# Patient Record
Sex: Female | Born: 1970 | Race: White | Hispanic: No | Marital: Married | Smoking: Never smoker
Health system: Southern US, Community
[De-identification: ages and names within clinical notes are randomized; demographics above are authoritative.]

## PROBLEM LIST (undated history)

## (undated) DIAGNOSIS — E785 Hyperlipidemia, unspecified: Secondary | ICD-10-CM

## (undated) DIAGNOSIS — IMO0002 Reserved for concepts with insufficient information to code with codable children: Secondary | ICD-10-CM

## (undated) DIAGNOSIS — I340 Nonrheumatic mitral (valve) insufficiency: Secondary | ICD-10-CM

## (undated) DIAGNOSIS — M329 Systemic lupus erythematosus, unspecified: Secondary | ICD-10-CM

## (undated) DIAGNOSIS — R002 Palpitations: Secondary | ICD-10-CM

## (undated) DIAGNOSIS — L719 Rosacea, unspecified: Secondary | ICD-10-CM

## (undated) DIAGNOSIS — J302 Other seasonal allergic rhinitis: Secondary | ICD-10-CM

## (undated) DIAGNOSIS — M1990 Unspecified osteoarthritis, unspecified site: Secondary | ICD-10-CM

## (undated) DIAGNOSIS — B029 Zoster without complications: Secondary | ICD-10-CM

## (undated) DIAGNOSIS — D649 Anemia, unspecified: Secondary | ICD-10-CM

## (undated) DIAGNOSIS — I10 Essential (primary) hypertension: Secondary | ICD-10-CM

## (undated) DIAGNOSIS — R0600 Dyspnea, unspecified: Secondary | ICD-10-CM

## (undated) DIAGNOSIS — F419 Anxiety disorder, unspecified: Secondary | ICD-10-CM

## (undated) DIAGNOSIS — K219 Gastro-esophageal reflux disease without esophagitis: Secondary | ICD-10-CM

## (undated) DIAGNOSIS — R011 Cardiac murmur, unspecified: Secondary | ICD-10-CM

## (undated) DIAGNOSIS — T7840XA Allergy, unspecified, initial encounter: Secondary | ICD-10-CM

## (undated) HISTORY — PX: ROTATOR CUFF REPAIR: SHX139

## (undated) HISTORY — DX: Allergy, unspecified, initial encounter: T78.40XA

## (undated) HISTORY — DX: Gastro-esophageal reflux disease without esophagitis: K21.9

## (undated) HISTORY — DX: Anxiety disorder, unspecified: F41.9

## (undated) HISTORY — PX: APPENDECTOMY: SHX54

## (undated) HISTORY — DX: Reserved for concepts with insufficient information to code with codable children: IMO0002

## (undated) HISTORY — DX: Unspecified osteoarthritis, unspecified site: M19.90

## (undated) HISTORY — DX: Nonrheumatic mitral (valve) insufficiency: I34.0

## (undated) HISTORY — DX: Zoster without complications: B02.9

## (undated) HISTORY — DX: Essential (primary) hypertension: I10

## (undated) HISTORY — DX: Rosacea, unspecified: L71.9

## (undated) HISTORY — DX: Dyspnea, unspecified: R06.00

## (undated) HISTORY — DX: Anemia, unspecified: D64.9

## (undated) HISTORY — DX: Cardiac murmur, unspecified: R01.1

## (undated) HISTORY — DX: Palpitations: R00.2

## (undated) HISTORY — DX: Systemic lupus erythematosus, unspecified: M32.9

## (undated) HISTORY — DX: Other seasonal allergic rhinitis: J30.2

## (undated) HISTORY — DX: Hyperlipidemia, unspecified: E78.5

---

## 1998-05-17 ENCOUNTER — Other Ambulatory Visit: Admission: RE | Admit: 1998-05-17 | Discharge: 1998-05-17 | Payer: Self-pay | Admitting: Obstetrics & Gynecology

## 1998-11-14 ENCOUNTER — Encounter: Payer: Self-pay | Admitting: Emergency Medicine

## 1998-11-14 ENCOUNTER — Inpatient Hospital Stay (HOSPITAL_COMMUNITY): Admission: EM | Admit: 1998-11-14 | Discharge: 1998-11-16 | Payer: Self-pay | Admitting: Emergency Medicine

## 1998-11-15 ENCOUNTER — Encounter: Payer: Self-pay | Admitting: Family Medicine

## 1999-01-11 ENCOUNTER — Inpatient Hospital Stay (HOSPITAL_COMMUNITY): Admission: AD | Admit: 1999-01-11 | Discharge: 1999-01-13 | Payer: Self-pay | Admitting: Obstetrics & Gynecology

## 2000-04-03 ENCOUNTER — Encounter: Payer: Self-pay | Admitting: Family Medicine

## 2000-04-03 ENCOUNTER — Encounter: Admission: RE | Admit: 2000-04-03 | Discharge: 2000-04-03 | Payer: Self-pay | Admitting: Family Medicine

## 2000-04-10 ENCOUNTER — Encounter: Payer: Self-pay | Admitting: Family Medicine

## 2000-04-10 ENCOUNTER — Encounter: Admission: RE | Admit: 2000-04-10 | Discharge: 2000-04-10 | Payer: Self-pay | Admitting: Family Medicine

## 2000-04-17 ENCOUNTER — Encounter: Admission: RE | Admit: 2000-04-17 | Discharge: 2000-04-17 | Payer: Self-pay | Admitting: Family Medicine

## 2000-04-17 ENCOUNTER — Encounter: Payer: Self-pay | Admitting: Family Medicine

## 2000-05-08 ENCOUNTER — Other Ambulatory Visit: Admission: RE | Admit: 2000-05-08 | Discharge: 2000-05-08 | Payer: Self-pay | Admitting: Obstetrics & Gynecology

## 2000-05-13 ENCOUNTER — Encounter: Payer: Self-pay | Admitting: Obstetrics & Gynecology

## 2000-05-13 ENCOUNTER — Encounter: Admission: RE | Admit: 2000-05-13 | Discharge: 2000-05-13 | Payer: Self-pay | Admitting: Obstetrics & Gynecology

## 2000-06-29 ENCOUNTER — Ambulatory Visit (HOSPITAL_COMMUNITY): Admission: RE | Admit: 2000-06-29 | Discharge: 2000-06-29 | Payer: Self-pay | Admitting: Neurosurgery

## 2000-06-29 ENCOUNTER — Encounter: Payer: Self-pay | Admitting: Neurosurgery

## 2002-07-13 ENCOUNTER — Emergency Department (HOSPITAL_COMMUNITY): Admission: EM | Admit: 2002-07-13 | Discharge: 2002-07-13 | Payer: Self-pay | Admitting: Emergency Medicine

## 2005-02-19 ENCOUNTER — Encounter: Admission: RE | Admit: 2005-02-19 | Discharge: 2005-02-19 | Payer: Self-pay | Admitting: Family Medicine

## 2005-12-31 ENCOUNTER — Other Ambulatory Visit: Admission: RE | Admit: 2005-12-31 | Discharge: 2005-12-31 | Payer: Self-pay | Admitting: Obstetrics and Gynecology

## 2007-01-27 ENCOUNTER — Other Ambulatory Visit: Admission: RE | Admit: 2007-01-27 | Discharge: 2007-01-27 | Payer: Self-pay | Admitting: Family Medicine

## 2007-10-09 ENCOUNTER — Encounter: Admission: RE | Admit: 2007-10-09 | Discharge: 2007-10-09 | Payer: Self-pay | Admitting: Family Medicine

## 2008-01-18 ENCOUNTER — Ambulatory Visit (HOSPITAL_COMMUNITY): Admission: RE | Admit: 2008-01-18 | Discharge: 2008-01-18 | Payer: Self-pay | Admitting: Family Medicine

## 2008-03-01 ENCOUNTER — Other Ambulatory Visit: Admission: RE | Admit: 2008-03-01 | Discharge: 2008-03-01 | Payer: Self-pay | Admitting: Family Medicine

## 2008-03-11 ENCOUNTER — Ambulatory Visit: Payer: Self-pay

## 2008-03-11 ENCOUNTER — Encounter: Payer: Self-pay | Admitting: Cardiovascular Disease

## 2008-03-11 ENCOUNTER — Ambulatory Visit: Payer: Self-pay | Admitting: Cardiovascular Disease

## 2008-05-26 ENCOUNTER — Ambulatory Visit: Payer: Self-pay | Admitting: Cardiovascular Disease

## 2008-11-01 ENCOUNTER — Ambulatory Visit: Payer: Self-pay | Admitting: Cardiovascular Disease

## 2009-02-18 DIAGNOSIS — M329 Systemic lupus erythematosus, unspecified: Secondary | ICD-10-CM | POA: Insufficient documentation

## 2009-02-18 DIAGNOSIS — R0602 Shortness of breath: Secondary | ICD-10-CM | POA: Insufficient documentation

## 2009-02-18 DIAGNOSIS — K219 Gastro-esophageal reflux disease without esophagitis: Secondary | ICD-10-CM | POA: Insufficient documentation

## 2009-02-18 DIAGNOSIS — R002 Palpitations: Secondary | ICD-10-CM | POA: Insufficient documentation

## 2009-02-18 DIAGNOSIS — I080 Rheumatic disorders of both mitral and aortic valves: Secondary | ICD-10-CM | POA: Insufficient documentation

## 2009-04-07 ENCOUNTER — Emergency Department (HOSPITAL_COMMUNITY): Admission: EM | Admit: 2009-04-07 | Discharge: 2009-04-08 | Payer: Self-pay | Admitting: Emergency Medicine

## 2009-04-11 ENCOUNTER — Ambulatory Visit: Payer: Self-pay | Admitting: Diagnostic Radiology

## 2009-04-11 ENCOUNTER — Emergency Department (HOSPITAL_BASED_OUTPATIENT_CLINIC_OR_DEPARTMENT_OTHER): Admission: EM | Admit: 2009-04-11 | Discharge: 2009-04-11 | Payer: Self-pay | Admitting: Emergency Medicine

## 2009-04-12 ENCOUNTER — Ambulatory Visit: Payer: Self-pay | Admitting: Family Medicine

## 2009-04-12 DIAGNOSIS — J45909 Unspecified asthma, uncomplicated: Secondary | ICD-10-CM | POA: Insufficient documentation

## 2009-04-13 ENCOUNTER — Telehealth (INDEPENDENT_AMBULATORY_CARE_PROVIDER_SITE_OTHER): Payer: Self-pay | Admitting: *Deleted

## 2009-05-01 ENCOUNTER — Ambulatory Visit: Payer: Self-pay | Admitting: Cardiovascular Disease

## 2009-05-01 ENCOUNTER — Telehealth (INDEPENDENT_AMBULATORY_CARE_PROVIDER_SITE_OTHER): Payer: Self-pay | Admitting: *Deleted

## 2009-05-16 ENCOUNTER — Encounter: Admission: RE | Admit: 2009-05-16 | Discharge: 2009-08-14 | Payer: Self-pay | Admitting: Orthopedic Surgery

## 2009-07-25 ENCOUNTER — Encounter: Payer: Self-pay | Admitting: Family Medicine

## 2009-07-25 ENCOUNTER — Ambulatory Visit: Payer: Self-pay | Admitting: Family Medicine

## 2009-07-25 ENCOUNTER — Other Ambulatory Visit: Admission: RE | Admit: 2009-07-25 | Discharge: 2009-07-25 | Payer: Self-pay | Admitting: Family Medicine

## 2009-07-25 DIAGNOSIS — L719 Rosacea, unspecified: Secondary | ICD-10-CM | POA: Insufficient documentation

## 2009-07-27 ENCOUNTER — Encounter (INDEPENDENT_AMBULATORY_CARE_PROVIDER_SITE_OTHER): Payer: Self-pay | Admitting: *Deleted

## 2009-07-28 ENCOUNTER — Ambulatory Visit: Payer: Self-pay | Admitting: Family Medicine

## 2009-08-01 ENCOUNTER — Encounter: Admission: RE | Admit: 2009-08-01 | Discharge: 2009-08-01 | Payer: Self-pay | Admitting: Family Medicine

## 2009-08-01 ENCOUNTER — Encounter (INDEPENDENT_AMBULATORY_CARE_PROVIDER_SITE_OTHER): Payer: Self-pay | Admitting: *Deleted

## 2009-08-01 LAB — CONVERTED CEMR LAB
Albumin: 4.1 g/dL (ref 3.5–5.2)
Eosinophils Absolute: 0.1 10*3/uL (ref 0.0–0.7)
Eosinophils Relative: 2.9 % (ref 0.0–5.0)
HCT: 38.8 % (ref 36.0–46.0)
Hemoglobin: 13 g/dL (ref 12.0–15.0)
MCHC: 33.6 g/dL (ref 30.0–36.0)
MCV: 89.6 fL (ref 78.0–100.0)
Monocytes Relative: 7.1 % (ref 3.0–12.0)
Neutrophils Relative %: 58.9 % (ref 43.0–77.0)
RDW: 12.2 % (ref 11.5–14.6)
TSH: 0.85 microintl units/mL (ref 0.35–5.50)
Total Bilirubin: 0.9 mg/dL (ref 0.3–1.2)
Total Protein: 6.9 g/dL (ref 6.0–8.3)
Triglycerides: 71 mg/dL (ref 0.0–149.0)

## 2009-08-02 ENCOUNTER — Telehealth (INDEPENDENT_AMBULATORY_CARE_PROVIDER_SITE_OTHER): Payer: Self-pay | Admitting: *Deleted

## 2009-08-04 ENCOUNTER — Ambulatory Visit: Payer: Self-pay | Admitting: Family Medicine

## 2009-08-04 DIAGNOSIS — R030 Elevated blood-pressure reading, without diagnosis of hypertension: Secondary | ICD-10-CM | POA: Insufficient documentation

## 2009-08-06 ENCOUNTER — Encounter: Admission: RE | Admit: 2009-08-06 | Discharge: 2009-08-06 | Payer: Self-pay | Admitting: Orthopedic Surgery

## 2009-08-24 ENCOUNTER — Encounter: Admission: RE | Admit: 2009-08-24 | Discharge: 2009-08-31 | Payer: Self-pay | Admitting: Orthopedic Surgery

## 2009-09-13 ENCOUNTER — Encounter (INDEPENDENT_AMBULATORY_CARE_PROVIDER_SITE_OTHER): Payer: Self-pay | Admitting: *Deleted

## 2009-10-31 ENCOUNTER — Ambulatory Visit: Payer: Self-pay | Admitting: Family

## 2009-10-31 DIAGNOSIS — J0100 Acute maxillary sinusitis, unspecified: Secondary | ICD-10-CM | POA: Insufficient documentation

## 2010-01-13 ENCOUNTER — Encounter: Admission: RE | Admit: 2010-01-13 | Discharge: 2010-01-13 | Payer: Self-pay | Admitting: Orthopedic Surgery

## 2010-01-16 ENCOUNTER — Encounter: Admission: RE | Admit: 2010-01-16 | Discharge: 2010-01-16 | Payer: Self-pay | Admitting: Neurological Surgery

## 2010-01-22 ENCOUNTER — Telehealth (INDEPENDENT_AMBULATORY_CARE_PROVIDER_SITE_OTHER): Payer: Self-pay | Admitting: *Deleted

## 2010-01-23 HISTORY — PX: CERVICAL SPINE SURGERY: SHX589

## 2010-02-06 ENCOUNTER — Encounter: Payer: Self-pay | Admitting: Family

## 2010-03-20 ENCOUNTER — Encounter: Admission: RE | Admit: 2010-03-20 | Discharge: 2010-03-20 | Payer: Self-pay | Admitting: Neurological Surgery

## 2010-05-25 HISTORY — PX: CHOLECYSTECTOMY: SHX55

## 2010-06-12 ENCOUNTER — Encounter: Admission: RE | Admit: 2010-06-12 | Discharge: 2010-06-12 | Payer: Self-pay | Admitting: Neurological Surgery

## 2010-06-13 ENCOUNTER — Ambulatory Visit: Payer: Self-pay | Admitting: Diagnostic Radiology

## 2010-06-13 ENCOUNTER — Ambulatory Visit (HOSPITAL_COMMUNITY): Admission: EM | Admit: 2010-06-13 | Discharge: 2010-06-15 | Payer: Self-pay

## 2010-06-13 ENCOUNTER — Encounter: Payer: Self-pay | Admitting: Emergency Medicine

## 2010-06-14 ENCOUNTER — Encounter (INDEPENDENT_AMBULATORY_CARE_PROVIDER_SITE_OTHER): Payer: Self-pay

## 2010-08-14 ENCOUNTER — Ambulatory Visit: Payer: Self-pay | Admitting: Family Medicine

## 2010-08-14 DIAGNOSIS — F411 Generalized anxiety disorder: Secondary | ICD-10-CM | POA: Insufficient documentation

## 2010-08-20 ENCOUNTER — Encounter: Admission: RE | Admit: 2010-08-20 | Discharge: 2010-10-16 | Payer: Self-pay | Admitting: Orthopedic Surgery

## 2010-09-01 ENCOUNTER — Encounter: Admission: RE | Admit: 2010-09-01 | Discharge: 2010-09-01 | Payer: Self-pay | Admitting: Neurological Surgery

## 2010-09-11 ENCOUNTER — Other Ambulatory Visit: Admission: RE | Admit: 2010-09-11 | Discharge: 2010-09-11 | Payer: Self-pay | Admitting: Family Medicine

## 2010-09-11 ENCOUNTER — Ambulatory Visit: Payer: Self-pay | Admitting: Family Medicine

## 2010-09-11 LAB — CONVERTED CEMR LAB
ALT: 18 units/L (ref 0–35)
Albumin: 4 g/dL (ref 3.5–5.2)
Basophils Relative: 0.5 % (ref 0.0–3.0)
Creatinine, Ser: 0.6 mg/dL (ref 0.4–1.2)
Eosinophils Relative: 4.2 % (ref 0.0–5.0)
GFR calc non Af Amer: 112.43 mL/min (ref >60)
Glucose, Bld: 84 mg/dL (ref 70–99)
HCT: 37.8 % (ref 36.0–46.0)
Hemoglobin: 12.9 g/dL (ref 12.0–15.0)
Lymphs Abs: 1.5 10*3/uL (ref 0.7–4.0)
Neutrophils Relative %: 61.7 % (ref 43.0–77.0)
Platelets: 189 10*3/uL (ref 150.0–400.0)
RBC: 4.2 M/uL (ref 3.87–5.11)
RDW: 14.3 % (ref 11.5–14.6)
Sodium: 140 meq/L (ref 135–145)
TSH: 0.87 microintl units/mL (ref 0.35–5.50)
Total Protein: 6.7 g/dL (ref 6.0–8.3)
WBC: 5.4 10*3/uL (ref 4.5–10.5)

## 2010-09-28 ENCOUNTER — Ambulatory Visit: Payer: Self-pay | Admitting: Internal Medicine

## 2010-09-28 ENCOUNTER — Encounter: Admission: RE | Admit: 2010-09-28 | Discharge: 2010-09-28 | Payer: Self-pay | Admitting: Family Medicine

## 2010-09-28 DIAGNOSIS — J069 Acute upper respiratory infection, unspecified: Secondary | ICD-10-CM | POA: Insufficient documentation

## 2010-11-25 DIAGNOSIS — A159 Respiratory tuberculosis unspecified: Secondary | ICD-10-CM

## 2010-11-25 HISTORY — DX: Respiratory tuberculosis unspecified: A15.9

## 2010-12-11 ENCOUNTER — Encounter
Admission: RE | Admit: 2010-12-11 | Discharge: 2010-12-11 | Payer: Self-pay | Source: Home / Self Care | Attending: Neurological Surgery | Admitting: Neurological Surgery

## 2010-12-25 NOTE — Letter (Signed)
Summary: Cancer Screening/[facility] Personalized Risk Profile  Cancer Screening/[facility] Personalized Risk Profile   Imported By: Lanelle Bal 09/18/2010 13:10:56  _____________________________________________________________________  External Attachment:    Type:   Image     Comment:   External Document
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
## 2010-12-25 NOTE — Progress Notes (Signed)
   Phone Note From Other Clinic   Caller: [facility] Details for Reason: Pt.Information Initial call taken by: KM    FAxed All Cardiac over to Jodie @ [phone] [facility]  January 22, 2010 8:22 AM

## 2010-12-25 NOTE — Assessment & Plan Note (Signed)
Summary: has had chest cold, gone into asthma??///sph   Vital Signs:  Patient profile:   40 year old female Menstrual status:  irregular Weight:      193 pounds BMI:     32.73 O2 Sat:      98 % Temp:     99.0 degrees F oral Pulse rate:   112 / minute Resp:     16 per minute BP sitting:   120 / 82  (left arm) Cuff size:   large  Vitals Entered By: Shonna Chock CMA (September 28, 2010 8:12 AM) CC: Chest cold and questions now if its asthma, URI symptoms   CC:  Chest cold and questions now if its asthma and URI symptoms.  History of Present Illness:  RTI  Symptoms      This is a 40 year old woman who presents with  RTI  symptoms; onset 3 weeks ago as ST & head congestion.  The patient reports scant purulent nasal discharge, sore throat from cough , and productive cough with significant yellow sputum, but denies nasal congestion and earache.  Associated symptoms include low-grade fever (<100.5 degrees), dyspnea, and wheezing.  PMH of asthma ; albuterol has been used up to tid  , but it  is esentially gone. The patient denies headache.  The patient denies the following risk factors for Strep sinusitis: unilateral facial pain, tooth pain, and tender adenopathy.  Rx: Claritin D, Mucinex, Delsym.Diagnosis of Lupus is questionable ; she is not on immunosuppresants.  Allergies: 1)  ! Codeine 2)  ! * Flovent  Review of Systems General:  Complains of sweats; denies chills. Resp:  Denies chest pain with inspiration. Allergy:  Denies itching eyes and sneezing.  Physical Exam  General:  well-nourished,in no acute distress; alert,appropriate and cooperative throughout examination Eyes:  No corneal or conjunctival inflammation noted. EOMI. Perrla.  Vision grossly normal. Ears:  External ear exam shows no significant lesions or deformities.  Otoscopic examination reveals clear canals, tympanic membranes are intact bilaterally without bulging, retraction, inflammation or discharge. Hearing is  grossly normal bilaterally. L TM minimally dull Nose:  External nasal examination shows no deformity or inflammation. Nasal mucosa are pink and moist without lesions or exudates. Mouth:  Oral mucosa and oropharynx without lesions or exudates.  Teeth in good repair. Lungs:  Normal respiratory effort, chest expands symmetrically. Lungs are clear to auscultation, no crackles or wheezes but dry paroxysmal cough. O2 sats 98%. Heart:  Normal rate and regular rhythm. S1 and S2 normal without gallop, murmur, click, rub.S4 Extremities:  No clubbing, cyanosis, edema, or deformity noted . Skin:  Intact without suspicious lesions or rashes. Facial flushing Cervical Nodes:  No lymphadenopathy noted Axillary Nodes:  No palpable lymphadenopathy Psych:  memory intact for recent and remote, normally interactive, and good eye contact.     Impression & Recommendations:  Problem # 1:  BRONCHITIS-ACUTE (ICD-466.0)  RAD component  by history; PMH of asthma  Her updated medication list for this problem includes:    Azithromycin 250 Mg Tabs (Azithromycin) .Marland Kitchen... As per pack    Proventil Hfa 108 (90 Base) Mcg/act Aers (Albuterol sulfate) .Marland Kitchen... 1-2 puffs every 4 -6 hrs as needed for cough , wheezing    Dulera 200-5 Mcg/act Aero (Mometasone furo-formoterol fum) .Marland Kitchen... 1-2 puffs every 12 hrs ; gargle &spit after use  Problem # 2:  URI (ICD-465.9)  Complete Medication List: 1)  Citalopram Hydrobromide 10 Mg Tabs (Citalopram hydrobromide) .... Take 1 tab by mouth daily  2)  Celebrex  .... Take daily dose unknown 3)  Flexeril 10 Mg Tabs (Cyclobenzaprine hcl) .... Take one tablet as needed 4)  Azithromycin 250 Mg Tabs (Azithromycin) .... As per pack 5)  Proventil Hfa 108 (90 Base) Mcg/act Aers (Albuterol sulfate) .Marland Kitchen.. 1-2 puffs every 4 -6 hrs as needed for cough , wheezing 6)  Dulera 200-5 Mcg/act Aero (Mometasone furo-formoterol fum) .Marland Kitchen.. 1-2 puffs every 12 hrs ; gargle &spit after use  Patient Instructions: 1)   Neti pot once daily as needed for head congestion .  2)  Drink as much  NON dairy fluid as you can tolerate for the next few days. Prescriptions: DULERA 200-5 MCG/ACT AERO (MOMETASONE FURO-FORMOTEROL FUM) 1-2 puffs every 12 hrs ; gargle &spit after use  #1 x 11   Entered and Authorized by:   Marga Melnick MD   Signed by:   Marga Melnick MD on 09/28/2010   Method used:   Samples Given   RxID:   [id number] PROVENTIL HFA 108 (90 BASE) MCG/ACT AERS (ALBUTEROL SULFATE) 1-2 puffs every 4 -6 hrs as needed for cough , wheezing  #1 x 2   Entered and Authorized by:   Marga Melnick MD   Signed by:   Marga Melnick MD on 09/28/2010   Method used:   Samples Given   RxID:   [id number] AZITHROMYCIN 250 MG TABS (AZITHROMYCIN) as per pack  #1 x 0   Entered and Authorized by:   Marga Melnick MD   Signed by:   Marga Melnick MD on 09/28/2010   Method used:   Faxed to ...       [id number]* (retail)       [address]       [place], [place]  [address]       Ph: [phone]       Fax: [phone]   RxID:   [id number]    Orders Added: 1)  Est. Patient Level IV [295[address]]  Appended Document: has had chest cold, gone into asthma??///sph Note : P recheck was 72. Tylenol every 4 hrs as needed for fever
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
## 2010-12-25 NOTE — Letter (Signed)
Summary: [facility]  [facility]   Imported By: Lanelle Bal 02/15/2010 11:53:12  _____________________________________________________________________  External Attachment:    Type:   Image     Comment:   External Document

## 2011-02-01 ENCOUNTER — Other Ambulatory Visit: Payer: Self-pay | Admitting: Neurological Surgery

## 2011-02-01 DIAGNOSIS — M542 Cervicalgia: Secondary | ICD-10-CM

## 2011-02-09 LAB — DIFFERENTIAL
Lymphocytes Relative: 22 % (ref 12–46)
Lymphs Abs: 1.3 10*3/uL (ref 0.7–4.0)
Monocytes Absolute: 0.3 10*3/uL (ref 0.1–1.0)
Neutrophils Relative %: 67 % (ref 43–77)

## 2011-02-09 LAB — URINE CULTURE
Colony Count: 60000
Culture  Setup Time: 201107202043

## 2011-02-09 LAB — CBC
Hemoglobin: 12.9 g/dL (ref 12.0–15.0)
MCHC: 33 g/dL (ref 30.0–36.0)
MCV: 88.4 fL (ref 78.0–100.0)
RBC: 4.43 MIL/uL (ref 3.87–5.11)

## 2011-02-09 LAB — URINE MICROSCOPIC-ADD ON

## 2011-02-09 LAB — URINALYSIS, ROUTINE W REFLEX MICROSCOPIC
Bilirubin Urine: NEGATIVE
Glucose, UA: NEGATIVE mg/dL
Hgb urine dipstick: NEGATIVE
Ketones, ur: NEGATIVE mg/dL
Specific Gravity, Urine: 1.013 (ref 1.005–1.030)

## 2011-02-09 LAB — COMPREHENSIVE METABOLIC PANEL
AST: 17 U/L (ref 0–37)
Albumin: 4.1 g/dL (ref 3.5–5.2)
Alkaline Phosphatase: 72 U/L (ref 39–117)
BUN: 10 mg/dL (ref 6–23)
Creatinine, Ser: 0.7 mg/dL (ref 0.4–1.2)
GFR calc Af Amer: >60 mL/min (ref >60)
Glucose, Bld: 96 mg/dL (ref 70–99)
Potassium: 4 mEq/L (ref 3.5–5.1)
Sodium: 142 mEq/L (ref 135–145)
Total Protein: 7.2 g/dL (ref 6.0–8.3)

## 2011-02-09 LAB — PREGNANCY, URINE: Preg Test, Ur: NEGATIVE

## 2011-02-09 LAB — POCT CARDIAC MARKERS: Myoglobin, poc: 36.2 ng/mL (ref 12–200)

## 2011-02-13 ENCOUNTER — Ambulatory Visit (INDEPENDENT_AMBULATORY_CARE_PROVIDER_SITE_OTHER): Payer: BC Managed Care – PPO | Admitting: *Deleted

## 2011-02-13 DIAGNOSIS — Z111 Encounter for screening for respiratory tuberculosis: Secondary | ICD-10-CM

## 2011-02-15 ENCOUNTER — Ambulatory Visit (HOSPITAL_BASED_OUTPATIENT_CLINIC_OR_DEPARTMENT_OTHER)
Admission: RE | Admit: 2011-02-15 | Discharge: 2011-02-15 | Disposition: A | Discharge status: Home / Self Care | Payer: BC Managed Care – PPO | Source: Ambulatory Visit | Attending: Family Medicine | Admitting: Family Medicine

## 2011-02-15 DIAGNOSIS — R7611 Nonspecific reaction to tuberculin skin test without active tuberculosis: Secondary | ICD-10-CM | POA: Insufficient documentation

## 2011-02-15 DIAGNOSIS — Z111 Encounter for screening for respiratory tuberculosis: Secondary | ICD-10-CM

## 2011-02-15 LAB — TB SKIN TEST: TB Skin Test: POSITIVE mm

## 2011-02-15 MED ORDER — ISONIAZID 300 MG PO TABS: 300.0000 mg | ORAL_TABLET | Freq: Every day | ORAL | Status: AC

## 2011-02-15 MED ORDER — PYRIDOXINE HCL 50 MG PO TABS: 50.0000 mg | ORAL_TABLET | Freq: Every day | ORAL | Status: AC

## 2011-02-15 NOTE — Progress Notes (Signed)
  Subjective:    Patient ID: Lynn Campos, female    DOB: [birth year], 40 y.o.   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  Asymptomatic.  Denies cough, chills, fevers, nightsweats.  Review of Systems For ROS see HPI     Objective:   Physical Exam R forearm- 15 mm of erythema and induration at site of PPD       Assessment & Plan:  + PPD- get CXR and start 9 months of isoniazide + Vit B6.  Pt aware.

## 2011-02-20 ENCOUNTER — Telehealth: Payer: Self-pay | Admitting: *Deleted

## 2011-02-20 ENCOUNTER — Telehealth: Payer: Self-pay | Admitting: Family Medicine

## 2011-02-20 NOTE — Telephone Encounter (Signed)
Please advise 

## 2011-02-20 NOTE — Telephone Encounter (Signed)
Discuss with patient, will continue current med and if symptoms do not resolve will call back for med change.Felecia Brant Peets CMA

## 2011-02-20 NOTE — Telephone Encounter (Signed)
There are 2 options- either continue the med and hope the side effects improve w/ time OR switch to Rifampin 600mg  daily x4 months.

## 2011-02-20 NOTE — Telephone Encounter (Signed)
Pt states that since starting isoniazid she has been experiencing diarrhea and blurred vision. Pt notes that these are some of the side effects listed on med. Pt [facility]. Pls advise.Lynn Campos CMA

## 2011-02-20 NOTE — Telephone Encounter (Signed)
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   thanks

## 2011-02-20 NOTE — Telephone Encounter (Signed)
Needs hepatic panel (doesn't need to be fasting).  Dx- high risk medication use

## 2011-02-21 NOTE — Telephone Encounter (Signed)
Updated lab info for 6/13 lab visit--called patient to inform her that it does not have to be fasting

## 2011-03-01 LAB — HM MAMMOGRAPHY

## 2011-03-05 LAB — LACTIC ACID, PLASMA: Lactic Acid, Venous: 1.8 mmol/L (ref 0.5–2.2)

## 2011-03-05 LAB — CBC
MCHC: 34.7 g/dL (ref 30.0–36.0)
RBC: 4.34 MIL/uL (ref 3.87–5.11)
WBC: 7.7 10*3/uL (ref 4.0–10.5)

## 2011-03-11 ENCOUNTER — Ambulatory Visit (HOSPITAL_BASED_OUTPATIENT_CLINIC_OR_DEPARTMENT_OTHER)
Admission: RE | Admit: 2011-03-11 | Discharge: 2011-03-11 | Disposition: A | Discharge status: Home / Self Care | Payer: BC Managed Care – PPO | Source: Ambulatory Visit | Attending: Family Medicine | Admitting: Family Medicine

## 2011-03-11 ENCOUNTER — Encounter: Payer: Self-pay | Admitting: Family Medicine

## 2011-03-11 ENCOUNTER — Ambulatory Visit (INDEPENDENT_AMBULATORY_CARE_PROVIDER_SITE_OTHER): Payer: BC Managed Care – PPO | Admitting: Family Medicine

## 2011-03-11 VITALS — BP 140/90 | HR 131 | Temp 99.1°F | Wt 198.5 lb

## 2011-03-11 DIAGNOSIS — R05 Cough: Secondary | ICD-10-CM

## 2011-03-11 DIAGNOSIS — R059 Cough, unspecified: Secondary | ICD-10-CM

## 2011-03-11 MED ORDER — OSELTAMIVIR PHOSPHATE 75 MG PO CAPS: 75.0000 mg | ORAL_CAPSULE | Freq: Two times a day (BID) | ORAL | Status: AC

## 2011-03-11 NOTE — Assessment & Plan Note (Signed)
Given temp and cough must r/o PNA prior to starting Tamiflu.  sxs of rapid onset, body aches, cough and fever consistent w/ flu like illness.  If CXR (-) will start antiviral meds.  Reviewed supportive care and red flags that should prompt return.  Pt expressed understanding and is in agreement w/ plan.

## 2011-03-11 NOTE — Progress Notes (Signed)
  Subjective:    Patient ID: Lynn Campos, female    DOB: [birth year], 40 y.o.   MRN: [id number]  HPI Cough- started Saturday, 'like a truck'.  + fevers, Tm 101.4.  Alternating tylenol/ibuprofen.  + body aches which is most bothersome to pt.  Cough- productive of green sputum.  Denies facial pain/pressure, nasal congestion.  + sore throat.  + PND.  Denies ear pain.  Denies sick contacts.  'even my hair hurts'.   Review of Systems For ROS see HPI     Objective:   Physical Exam  Constitutional: She is oriented to person, place, and time.       Not feeling well  HENT:  Head: Normocephalic and atraumatic.       No TTP over sinuses  Eyes: Conjunctivae and EOM are normal. Pupils are equal, round, and reactive to light.  Neck: Normal range of motion. Neck supple. No thyromegaly present.  Cardiovascular: Normal rate, regular rhythm, normal heart sounds and intact distal pulses.   No murmur heard. Pulmonary/Chest: Effort normal and breath sounds normal. No respiratory distress. She has no wheezes.       Wet cough heard  Lymphadenopathy:    She has no cervical adenopathy.  Neurological: She is alert and oriented to person, place, and time.  Skin: Skin is warm and dry.          Assessment & Plan:

## 2011-03-11 NOTE — Patient Instructions (Signed)
[facility] to rule out Pneumonia Your flu test was negative but i'm still suspicious- so if there isn't a pneumonia we'll start the Tamiflu Continue the Ibuprofen for pain Use a heating pad for muscle aches Drink plenty of fluids Hang in there!

## 2011-03-13 ENCOUNTER — Telehealth: Payer: Self-pay | Admitting: *Deleted

## 2011-03-13 NOTE — Telephone Encounter (Signed)
Pt still c/o fever, bodyaches, sore throat, cough, headache. Pt note that fever is still runnning 101.2 and 100.2. Pt is unable t alternate between med due to +tb skin test. Pt states that she still feel really bad. Pt uses [facility] [address] [facility] [address].Please advise

## 2011-03-13 NOTE — Telephone Encounter (Signed)
Pt.notified

## 2011-03-13 NOTE — Telephone Encounter (Signed)
Pt should be on Tamiflu at this time.  This illness can last for 7-10 days- continue ibuprofen.  CXR was negative for pneumonia- unfortunately there's not much else to do at this time.

## 2011-03-14 ENCOUNTER — Telehealth: Payer: Self-pay | Admitting: Family Medicine

## 2011-03-14 NOTE — Telephone Encounter (Signed)
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

## 2011-03-15 ENCOUNTER — Telehealth: Payer: Self-pay | Admitting: *Deleted

## 2011-03-15 ENCOUNTER — Encounter: Payer: Self-pay | Admitting: Family Medicine

## 2011-03-15 MED ORDER — BENZONATATE 200 MG PO CAPS: 200.0000 mg | ORAL_CAPSULE | Freq: Three times a day (TID) | ORAL | Status: DC | PRN

## 2011-03-15 NOTE — Telephone Encounter (Signed)
Pt still c/o fever and cough. Pt is feeling better other than that. Pt notes that she tried some of her daughter med for cough and it help. Pt would like a Rx for cough med..Please advise

## 2011-03-15 NOTE — Telephone Encounter (Signed)
Letter printed.

## 2011-03-15 NOTE — Telephone Encounter (Signed)
Ok for [employer] 200mg  , #60, TID prn for cough

## 2011-03-15 NOTE — Telephone Encounter (Signed)
Pt.notified

## 2011-03-15 NOTE — Telephone Encounter (Signed)
Left Pt detailed message letter ready for pick up.

## 2011-03-19 ENCOUNTER — Ambulatory Visit (INDEPENDENT_AMBULATORY_CARE_PROVIDER_SITE_OTHER): Payer: BC Managed Care – PPO | Admitting: Family Medicine

## 2011-03-19 ENCOUNTER — Ambulatory Visit
Admission: RE | Admit: 2011-03-19 | Discharge: 2011-03-19 | Disposition: A | Discharge status: Home / Self Care | Payer: BC Managed Care – PPO | Source: Ambulatory Visit | Attending: Neurological Surgery | Admitting: Neurological Surgery

## 2011-03-19 VITALS — BP 120/80 | Temp 99.4°F | Wt 199.2 lb

## 2011-03-19 DIAGNOSIS — M542 Cervicalgia: Secondary | ICD-10-CM

## 2011-03-19 DIAGNOSIS — R059 Cough, unspecified: Secondary | ICD-10-CM

## 2011-03-19 DIAGNOSIS — R05 Cough: Secondary | ICD-10-CM

## 2011-03-19 MED ORDER — BENZONATATE 200 MG PO CAPS: 200.0000 mg | ORAL_CAPSULE | Freq: Three times a day (TID) | ORAL | Status: AC | PRN

## 2011-03-19 MED ORDER — AMOXICILLIN 875 MG PO TABS: 875.0000 mg | ORAL_TABLET | Freq: Two times a day (BID) | ORAL | Status: DC

## 2011-03-19 NOTE — Patient Instructions (Signed)
Take the Amoxicillin as directed- take w/ food to avoid upset stomach (this is processed through the kidney) Drink plenty of fluids Tessalon as needed for cough Ibuprofen for pain, inflammation, fever Hang in there!

## 2011-03-19 NOTE — Progress Notes (Signed)
  Subjective:    Patient ID: Lynn Campos, female    DOB: 04/30/1971, 40 y.o.   MRN: [id number]  HPI Cough- body aches and fevers have improved.  Pt reports Wednesday afternoon there was a 'marked difference in how i felt.  I think the flu ended'.  Now fever is hovering around 100 and cough rather than body aches is most bothersome.  Cough is productive- brown, bloody sputum.  Denies nasal congestion, sinus pressure, ear pain.   Review of Systems For ROS see HPI     Objective:   Physical Exam  Constitutional: She appears well-developed and well-nourished. No distress.  HENT:  Head: Normocephalic and atraumatic.       TMs normal bilaterally Mild nasal congestion Throat w/out erythema, edema, or exudate  Eyes: Conjunctivae and EOM are normal. Pupils are equal, round, and reactive to light.  Neck: Normal range of motion. Neck supple.  Cardiovascular: Normal rate, regular rhythm, normal heart sounds and intact distal pulses.   No murmur heard. Pulmonary/Chest: Effort normal. No respiratory distress. She has no wheezes.       + hacking cough Coarse breath sounds throughout, R>L  Lymphadenopathy:    She has no cervical adenopathy.          Assessment & Plan:

## 2011-03-19 NOTE — Assessment & Plan Note (Signed)
Suspect cough has transitioned from viral flu like illness to bacterial given change in sputum.  Start abx- will use abx processed through the kidney rather than the liver since pt is on INH.  Reviewed supportive care and red flags that should prompt return.  Pt expressed understanding and is in agreement w/ plan.

## 2011-03-22 ENCOUNTER — Telehealth: Payer: Self-pay | Admitting: *Deleted

## 2011-03-22 NOTE — Telephone Encounter (Signed)
Pt still c/o cough greenish mucous,and fevers running around 100. Pt seen on 03-19-11 and started on amoxil.Pt use [facility]. Please advise

## 2011-03-22 NOTE — Telephone Encounter (Signed)
Pt.notified

## 2011-03-22 NOTE — Telephone Encounter (Signed)
Needs to give meds more time to work.

## 2011-03-26 ENCOUNTER — Telehealth: Payer: Self-pay | Admitting: Family Medicine

## 2011-03-26 MED ORDER — PROMETHAZINE-DM 6.25-15 MG/5ML PO SYRP: 5.0000 mL | ORAL_SOLUTION | ORAL | Status: AC

## 2011-03-26 NOTE — Telephone Encounter (Signed)
error 

## 2011-03-26 NOTE — Telephone Encounter (Signed)
Pt says she was given Amoxicillin 1 week ago and took the medication as directed. She is still having difficulty breathing and still has a lot of congestion. Does patient need another OV or can something else be called in for her?

## 2011-03-26 NOTE — Telephone Encounter (Signed)
Can switch to promethazine/dextromethorphan cough syrup- 1 tsp every 4-6 hrs prn for cough.  Can't do codeine due to allergy.  Her cough may be due to airway inflammation/cough variant asthma.  No need for additional abx at this time.  Should use albuterol regularly for cough/SOB.  Can start Qvar - 2 puffs twice daily until feeling better.  (please provide sample)

## 2011-03-26 NOTE — Telephone Encounter (Signed)
Pt aware and rx sent. Pt will pick up sample today or tomorrow.

## 2011-03-26 NOTE — Telephone Encounter (Addendum)
Spoke w/ pt cough no better tessalon not helping at all would like something stronger. Says she has had asthma in the past and has been using albuterol inhaler which is helping her w/ SOB from coughing so much. Fever has resolved but hasnt really felt much relief since starting atb.

## 2011-04-01 ENCOUNTER — Ambulatory Visit (INDEPENDENT_AMBULATORY_CARE_PROVIDER_SITE_OTHER): Payer: BC Managed Care – PPO | Admitting: Family Medicine

## 2011-04-01 VITALS — BP 130/88 | Temp 98.6°F | Wt 197.1 lb

## 2011-04-01 DIAGNOSIS — J45909 Unspecified asthma, uncomplicated: Secondary | ICD-10-CM

## 2011-04-01 MED ORDER — ALBUTEROL SULFATE HFA 108 (90 BASE) MCG/ACT IN AERS: 2.0000 | INHALATION_SPRAY | RESPIRATORY_TRACT | Status: DC | PRN

## 2011-04-01 MED ORDER — BECLOMETHASONE DIPROPIONATE 40 MCG/ACT IN AERS: 2.0000 | INHALATION_SPRAY | Freq: Two times a day (BID) | RESPIRATORY_TRACT | Status: DC

## 2011-04-01 MED ORDER — PREDNISONE 20 MG PO TABS: ORAL_TABLET | ORAL | Status: DC

## 2011-04-01 MED ORDER — METHYLPREDNISOLONE ACETATE 80 MG/ML IJ SUSP
80.0000 mg | Freq: Once | INTRAMUSCULAR | Status: AC
  Administered 2011-04-01: 80 mg via INTRAMUSCULAR

## 2011-04-01 NOTE — Progress Notes (Signed)
  Subjective:    Patient ID: Lynn Campos, female    DOB: [birth year], 40 y.o.   MRN: [id number]  HPI Asthma- sxs were initially improving w/ albuterol and Qvar.  Pt feels this has 'plateaued'.  Still having SOB, cough, chest tightness and 'difficulty getting a breath'.  sxs are occuring 'at least twice a day'.  sxs are typically worse in the morning and at night.   Review of Systems For ROS see HPI     Objective:   Physical Exam  Constitutional: She appears well-developed and well-nourished. No distress.  HENT:  Nose: Nose normal.  Mouth/Throat: Oropharynx is clear and moist.  Eyes: Conjunctivae and EOM are normal. Pupils are equal, round, and reactive to light.  Neck: Normal range of motion. Neck supple.  Cardiovascular: Normal rate and regular rhythm.   Pulmonary/Chest: Effort normal. No respiratory distress.       Prolonged expiratory phase, coarse BS diffusely, deep breaths trigger dry cough Able to take deep breath after albuterol neb w/out coughing  Lymphadenopathy:    She has no cervical adenopathy.          Assessment & Plan:

## 2011-04-01 NOTE — Patient Instructions (Signed)
Continue the Qvar as your asthma controller medicine Use the Albuterol as needed for cough and wheezing Start the Prednisone tomorrow If your shortness of breath worsens- go to the ER Hang in there!!!

## 2011-04-01 NOTE — Assessment & Plan Note (Signed)
Pt's sxs consistent w/ cough variant asthma- likely exacerbated by recent bacterial infxns.  Continue Qvar as controller med, albuterol prn.  Start prednisone.  Reviewed supportive care and red flags that should prompt return.  Pt expressed understanding and is in agreement w/ plan.

## 2011-04-02 ENCOUNTER — Telehealth: Payer: Self-pay | Admitting: *Deleted

## 2011-04-02 DIAGNOSIS — R05 Cough: Secondary | ICD-10-CM

## 2011-04-02 DIAGNOSIS — R059 Cough, unspecified: Secondary | ICD-10-CM

## 2011-04-02 NOTE — Telephone Encounter (Signed)
Pt aware to await call from [facility].

## 2011-04-02 NOTE — Telephone Encounter (Signed)
Pt.notified

## 2011-04-02 NOTE — Telephone Encounter (Signed)
There is no difference between neb tx and albuterol inhaler according to all the studies.  Continue to use albuterol inhaler regularly and we'll refer to pulmonary (please enter referral)

## 2011-04-02 NOTE — Telephone Encounter (Signed)
Pt seen on yesterday and given neb treatment and injection in office. Pt indicated that after OV she felt much better however symptoms are beginning to return. Pt would like to know if it would be possible for her to do neb treatment at home or should she just see pulmonary.Please advise

## 2011-04-09 NOTE — Assessment & Plan Note (Signed)
[facility]                            CARDIOLOGY OFFICE NOTE   HALY, FEHER                         MRN:          [id number]  DATE:03/11/2008                            DOB:          10/26/1971    Ms. Lynn Campos seen today as a new patient at the request of Dr. Artis Flock.  She  is the daughter of Lucia Gaskins, one of my long-term patients.  She is 40  years old and has been having palpitations with irregular heartbeats.  These symptoms have been going on for over year.  She had three  particularly bad episodes in November and December.  One was while  attempting to go to sleep, one was at dinner, and one was at the grocery  store.  They only last a minute or two, but she gets significant  palpitations with some irregularity.  There is nothing that she has been  able to identify that makes them go away.  She gets some chest pressure,  shortness of breath, and dizziness with them.   She has actually never had any syncope with them.  She apparently had a  Holter monitor last year which did not show anything, but her episodes  are sporadic.  She was tried on Bystolic and did not feel well on it,  and did not want to take anything on a regular basis since her symptoms  were infrequent.   She has not had an echo or stress test in the past as far as I can tell.   Her palpitations have actually been a little bit more quiescent over the  last month or two.   REVIEW OF SYSTEMS:  Otherwise, remarkable for new diagnosis of lupus.  She is seeking a second opinion at [facility].  She has not been started on  steroids or Plaquenil yet.   The lupus has caused significant arthritides swelling in the lower  extremities and joints as well as a malar rash.   PAST MEDICAL HISTORY:  Otherwise, fairly benign.  She has had a previous  appendectomy, and there is a question of mild reactive airway disease.   Meds:  No regular meds.   ALLERGIES:  Patient has a question of an  allergy to FLOVENT.   FAMILY HISTORY:  Remarkable for positive coronary disease in both mother  and father, mother Huntley Dec who I see has had a valve replacement repair.   SOCIAL HISTORY:  The patient is a Manufacturing systems engineer.  She is happily  married.  She has two children.  She is active with the kids.   She does not smoke or drink.   The patient works out using the elliptical machine and weights 30  minutes 3-4 times a week, and this has not made her palpitations worse.   PHYSICAL EXAMINATION:  GENERAL:  Her exam is remarkable for a healthy-  appearing young white female.  She does have a bit of malar rash. VITAL  SIGNS:  Blood pressure is 120/70, pulse 65 and regular, afebrile,  respiratory rate 14.  HEENT:  Unremarkable.  Carotids normal without  bruit, no  lymphadenopathy, thyromegaly or JVP elevation.  LUNGS:  Clear with good diaphragmatic motion.  No wheezing.  HEART:  S1, S2 with normal heart sounds, MR murmur is not appreciable,  PMI normal.  ABDOMEN:  Benign.  Bowel sounds positive, no AAA, no tenderness, no  hepatosplenomegaly, no hepatojugular reflux.  Distal pulses are intact  with trace edema.  NEUROLOGICAL:  Nonfocal.  SKIN:  Warm and dry.  MUSCULOSKELETAL:  No muscular weakness.   EKG is normal with minor lateral T wave flattening in leads V4, V5, and  V6.   I reviewed her 2-D echocardiogram today at length.  She does have some  mitral valve thickening which may be due to her lupus with mild MR.  She  has mild left atrial enlargement, and, otherwise, has good LV function  and no valve disease.   IMPRESSION:  1. Palpitations probably benign.  I suspect premature atrial      contractions or paroxysmal supraventricular tachycardia. Short-      acting prescription for Inderal 10 mg a day to take on an as needed      basis.  She will call us if she has cluster of symptoms in regards      to her palpitations, and we can get her an ACT II monitor.  2. Shortness of breath  and pain with the palpitations.  I think it is      reasonable to do a stress test on the patient.  She has minor      lateral T-wave changes on her EKG, and I think that it would be      better to do a stress Myoview on her.  I would like to see what her      blood pressure does with exercise and rule out any exercise-induced      arrhythmias.  3. Lupus.  The patient will follow up with [facility].  I suspect she      will be placed on at least Plaquenil.  Her ANA has turned positive      recently.  I did explain to her there is relationship between lupus      and heart disease.  4. Mitral regurgitation currently mild.  May be due to lupus valve      disease.  Follow-up echocardiogram in a year.  Keep dentition in      good shape.   It was nice to see Neita today, and I will see her back in about six  months.     [name]. Eden Emms, MD, [facility]  Electronically Signed    PCN/MedQ  DD: 03/11/2008  DT: 03/11/2008  Job #: [id number]   cc:   Quita Skye. Artis Flock, M.D.

## 2011-04-09 NOTE — Assessment & Plan Note (Signed)
[facility]                            CARDIOLOGY OFFICE NOTE   Lynn Campos, Lynn Campos                         MRN:          [id number]  DATE:11/01/2008                            DOB:          Jul 23, 1971    Lynn Campos returns today for followup.  I have seen her previously for  palpitations.  She had an ACT II monitor.  There were no significant  arrhythmias, only an occasional PVC.  She does appear to have lupus.  She was seeing a specialist up at [facility].  She is on doxycycline for  some of her malar rash.   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  She has not gotten a flu shot.  From a cardiac  perspective, her treadmill test was nonischemic.  She had an echo, which  showed good LV function with only mild MR.   Review of systems otherwise remarkable for no significant asthma flares.  She is not on Plaquenil or any other medications outside of the  doxycycline for her lupus.   Her meds include calcium, Zantac, ibuprofen p.r.n., multivitamins, and  doxycycline; dose not given.   Her exam is remarkable for a healthy-appearing female with some malar  erythema.  Weight is 192, blood pressure 110/80, pulse 80 and regular,  respiratory rate 14, afebrile.  HEENT unremarkable.  Carotids are normal  without bruit.  No lymphadenopathy, thyromegaly, or JVP elevation.  Lungs are clear, good diaphragmatic motion.  No wheezing.  S1 and S2.  Normal heart sounds.  PMI normal.  Abdomen is benign.  Bowel sounds  positive.  No AAA, no tenderness, no bruit, no hepatosplenomegaly or  hepatojugular reflux noted.  Distal pulse intact.  No edema.  Neuro  nonfocal.  Skin warm and dry.  No muscular weakness.   IMPRESSION:  1. Palpitations, benign.  No correlation with ACT II monitor.  No      indication for further workup.  2. Structural heart disease ruled out.  Echo is fairly benign.  She      does have mild mitral  regurgitation with mitral valve thickening.      In the setting of lupus, she will have a followup echo in 2 years      to rule out progression of valvular heart disease.  3. Lupus.  Follow up at [facility].  Continue doxycycline.  I encouraged      her to get a flu shot.  4. Reflux.  Continue p.r.n. Zantac.  Avoid late-night meals and      recumbency with a full stomach.   I will see her back in a year.     Lynn Campos. Lynn Emms, MD, [facility]  Electronically Signed    PCN/MedQ  DD: 11/01/2008  DT: 11/01/2008  Job #: [id number]

## 2011-04-09 NOTE — Procedures (Signed)
 HEALTHCARE                              EXERCISE TREADMILL   LULIE, HURD                         MRN:          [id number]  DATE:05/26/2008                            DOB:          1971/02/03    INDICATION:  Palpitations, dyspnea, and chest pain.   The patient exercised for the equivalent of 30 seconds in stage IV of  the Bruce protocol.  Exercise was stopped due to dyspnea and achievement  of target heart rate.  Maximum heart rate was 183.  Peak blood pressure  was 187/95.  Resting EKG was normal.  With stress there was no ischemia.  The patient had occasional PVCs, both at peak exercise and recovery.   IMPRESSION:  Negative exercise stress test at a good workload.  The  patient will monitor her blood pressure at home and pursue activity  program and low-salt diet.  She will call me if her blood pressures are  running over 150/90.   However, her stress test was negative for any significant arrhythmias or  ischemia.     [name]. Eden Emms, MD, [facility]  Electronically Signed    PCN/MedQ  DD: 05/26/2008  DT: 05/27/2008  Job #: [id number]

## 2011-04-11 ENCOUNTER — Encounter: Payer: Self-pay | Admitting: Pulmonary Disease

## 2011-04-18 ENCOUNTER — Ambulatory Visit (INDEPENDENT_AMBULATORY_CARE_PROVIDER_SITE_OTHER): Payer: BC Managed Care – PPO | Admitting: Pulmonary Disease

## 2011-04-18 ENCOUNTER — Encounter: Payer: Self-pay | Admitting: Pulmonary Disease

## 2011-04-18 VITALS — BP 110/82 | HR 112 | Temp 98.3°F | Ht 64.0 in | Wt 193.8 lb

## 2011-04-18 DIAGNOSIS — R05 Cough: Secondary | ICD-10-CM

## 2011-04-18 DIAGNOSIS — R059 Cough, unspecified: Secondary | ICD-10-CM

## 2011-04-18 NOTE — Progress Notes (Signed)
  Subjective:    Patient ID: Lynn Campos, female    DOB: 1971-11-23, 40 y.o.   MRN: [id number]  HPI The pt is a 40 y/o female who I have been asked to see for recent episode ?asthma after a pulmonary infection.  The pt was in her usual state of health until the middle of April, when she developed high fever, myalgias, and persistent cough which ultimately began to produce discolored mucus.  She had doe as well.  She was treated with amoxacillin for acute bronchitis, and did feel better, but then began to develop wheezing.  She also had a feeling of restriction to airflow.  She had a cxr that was normal.  She was given a steroid taper and started on qvar, and has seen significant improvement with a return to baseline.  The question is raised whether she needs ongoing treatment for asthma.  She was diagnosed with asthma as a child, but required no treatment after her early years.  She was allergy tested as a teenager, and did take allergy vaccine for a period of time.  She was hospitalized 10 yrs ago for asthma????  She currently does have issues with pnd and throat clearing.  She has never had pfts.    Review of Systems  Constitutional: Positive for appetite change. Negative for fever and unexpected weight change.  HENT: Negative for ear pain, nosebleeds, congestion, sore throat, rhinorrhea, sneezing, trouble swallowing, dental problem, postnasal drip and sinus pressure.   Eyes: Negative for redness and itching.  Respiratory: Positive for cough and shortness of breath. Negative for chest tightness and wheezing.   Cardiovascular: Positive for chest pain. Negative for palpitations and leg swelling.  Gastrointestinal: Negative for nausea and vomiting.  Genitourinary: Negative for dysuria.  Musculoskeletal: Negative for joint swelling.  Skin: Negative for rash.  Neurological: Negative for headaches.  Hematological: Does not bruise/bleed easily.  Psychiatric/Behavioral: Negative for dysphoric mood. The  patient is not nervous/anxious.        Objective:   Physical Exam Constitutional:  Well developed, no acute distress  HENT:  Nares patent without discharge  Oropharynx without exudate, palate and uvula are normal, tongue with mild thrush  Eyes:  Perrla, eomi, no scleral icterus  Neck:  No JVD, no TMG  Cardiovascular:  Normal rate, regular rhythm, no rubs or gallops.  No murmurs        Intact distal pulses  Pulmonary :  Normal breath sounds, no stridor or respiratory distress   No rales, rhonchi, or wheezing  Abdominal:  Soft, nondistended, bowel sounds present.  No tenderness noted.   Musculoskeletal:  No lower extremity edema noted.  Lymph Nodes:  No cervical lymphadenopathy noted  Skin:  No cyanosis noted  Neurologic:  Alert, appropriate, moves all 4 extremities without obvious deficit.         Assessment & Plan:

## 2011-04-18 NOTE — Assessment & Plan Note (Addendum)
The pt has had significant airway symptoms post viral/bacterial infection, and it is unclear if this is true asthma or just a post-infectious bronchiolitis.  She has a h/o asthma as a child, and certainly this can return in adulthood at some point.  She feels she has returned to a normal baseline, and her spirometry today is totally normal.  If she truly has asthma, she will need lifetime treatment with ICS, but if a bronchiolitis, may only need treatment when she has severe episodes of infection.  I am willing to follow her off medication, but if she has return of symptoms, more than likely this is true asthma.  She will also need yearly spirometry if she is able to stay off ICS, to verify she is not having airway remodeling associated with occult asthma.  I have discussed all of this with her in detail, and she is agreeable to what has been discussed.

## 2011-04-18 NOTE — Patient Instructions (Signed)
Stay off qvar for now, but can use albuterol for rescue if needed.  If using more than twice a week, let me know followup with me in 8 weeks, but call if your symptoms are returning.

## 2011-04-25 ENCOUNTER — Telehealth: Payer: Self-pay | Admitting: Pulmonary Disease

## 2011-04-25 NOTE — Telephone Encounter (Signed)
Spoke with pt. She was last seen 5/24 for asthma flare. She states that her cough had improved, but over the wkend it has started back and is slightly prod with white to yellow sputum. She states has also had occ wheezing with exertion. She has only had to use her rescue inhaler once and states that this helped. She states that she just wanted to let [facility] know in case he wanted her to start back on the qvar. She is okay with call back tomorrow. Pls advise thanks

## 2011-04-25 NOTE — Telephone Encounter (Signed)
Patient phoned stated she was returning a call to triage she can be reached at [phone].[place] Nickel

## 2011-04-25 NOTE — Telephone Encounter (Signed)
[email or web address].Kamaron Deskins, CMA  

## 2011-04-26 NOTE — Telephone Encounter (Signed)
Patient phoned returning a call to triage she can be reached at [phone] or [phone].[place] Nickel

## 2011-04-26 NOTE — Telephone Encounter (Signed)
LMOMTCB x 1 

## 2011-04-26 NOTE — Telephone Encounter (Signed)
Pt aware to remain off Qvar through the weekend if possible and to call first of the week to give an update on how she is doing and whether or not she had to restart Qvar.

## 2011-04-26 NOTE — Telephone Encounter (Signed)
I would give it thru the weekend, but if symptoms are hanging around or if worsening, she does need to get back on the qvar.  Have her give [place] a call if she does go back on qvar, so we can document and arrange followup.

## 2011-05-07 ENCOUNTER — Other Ambulatory Visit: Payer: Self-pay | Admitting: Family Medicine

## 2011-05-07 DIAGNOSIS — Z79899 Other long term (current) drug therapy: Secondary | ICD-10-CM

## 2011-05-08 ENCOUNTER — Encounter: Payer: Self-pay | Admitting: *Deleted

## 2011-05-08 ENCOUNTER — Other Ambulatory Visit (INDEPENDENT_AMBULATORY_CARE_PROVIDER_SITE_OTHER): Payer: BC Managed Care – PPO

## 2011-05-08 DIAGNOSIS — Z79899 Other long term (current) drug therapy: Secondary | ICD-10-CM

## 2011-05-08 LAB — HEPATIC FUNCTION PANEL
ALT: 21 U/L (ref 0–35)
AST: 25 U/L (ref 0–37)
Albumin: 4 g/dL (ref 3.5–5.2)
Alkaline Phosphatase: 45 U/L (ref 39–117)
Bilirubin, Direct: 0.1 mg/dL (ref 0.0–0.3)
Total Protein: 7 g/dL (ref 6.0–8.3)

## 2011-05-08 NOTE — Progress Notes (Signed)
Addended by: Legrand Como on: 05/08/2011 10:46 AM   Modules accepted: Orders

## 2011-05-08 NOTE — Progress Notes (Signed)
Labs only

## 2011-05-22 ENCOUNTER — Other Ambulatory Visit: Payer: Self-pay | Admitting: Family Medicine

## 2011-05-22 MED ORDER — CITALOPRAM HYDROBROMIDE 10 MG PO TABS: 10.0000 mg | ORAL_TABLET | Freq: Every day | ORAL | Status: DC

## 2011-05-22 NOTE — Telephone Encounter (Signed)
Refill sent.

## 2011-05-22 NOTE — Telephone Encounter (Signed)
Ok for #30, 6 refills 

## 2011-05-22 NOTE — Telephone Encounter (Signed)
Last refilled 07/2010. Please advise.

## 2011-06-13 ENCOUNTER — Encounter: Payer: Self-pay | Admitting: Pulmonary Disease

## 2011-06-13 ENCOUNTER — Ambulatory Visit (INDEPENDENT_AMBULATORY_CARE_PROVIDER_SITE_OTHER): Payer: BC Managed Care – PPO | Admitting: Pulmonary Disease

## 2011-06-13 VITALS — BP 122/80 | HR 106 | Temp 98.3°F | Ht 64.0 in | Wt 198.6 lb

## 2011-06-13 DIAGNOSIS — R05 Cough: Secondary | ICD-10-CM

## 2011-06-13 DIAGNOSIS — R059 Cough, unspecified: Secondary | ICD-10-CM

## 2011-06-13 NOTE — Patient Instructions (Signed)
Will start back on qvar  2 inhalations am and pm everyday.  Keep mouth rinsed well If you are improved, continue with the inhaler.  However can consider an alternative that is once a day If you are not improving within 2 weeks, call me If doing well, followup with me in 4mos

## 2011-06-13 NOTE — Assessment & Plan Note (Signed)
The pt has had a return of her cough, and is associated with chest tightness, limitation to airflow, and sob.  She has a h/o asthma as a child, and I suspect this is the issue currently.  I have given her the option of doing a methacholine challenge test to put the issue to rest, but clinically I suspect this is asthma.  Will restart ICS, and if her symptoms resolve, there will be no doubt.

## 2011-06-13 NOTE — Progress Notes (Signed)
  Subjective:    Patient ID: Lynn Campos, female    DOB: 1970-11-30, 40 y.o.   MRN: [id number]  HPI The pt comes in today for an acute sick visit.  She was recently seen for what sounded like a post infectious bronchiolitis, but there was also some concern about possible asthma.  She did well off meds, but then began to have a return of symptoms.  She has a dry cough, some increased sob with activity, and a feeling of chest tightness with limitation to airflow.  Denies mucus production.     Review of Systems  Constitutional: Negative for fever and unexpected weight change.  HENT: Negative for ear pain, nosebleeds, congestion, sore throat, rhinorrhea, sneezing, trouble swallowing, dental problem, postnasal drip and sinus pressure.   Eyes: Negative for redness and itching.  Respiratory: Positive for wheezing. Negative for chest tightness and shortness of breath.   Cardiovascular: Negative for palpitations and leg swelling.  Gastrointestinal: Negative for nausea and vomiting.  Genitourinary: Negative for dysuria.  Musculoskeletal: Negative for joint swelling.  Skin: Negative for rash.  Neurological: Negative for headaches.  Hematological: Does not bruise/bleed easily.  Psychiatric/Behavioral: Negative for dysphoric mood. The patient is not nervous/anxious.        Objective:   Physical Exam Ow female in nad Nares without purulence or discharge Chest totally clear Cor with rrr LE without edema or cyanosis Alert, oriented, moves all 4        Assessment & Plan:

## 2011-10-14 ENCOUNTER — Ambulatory Visit (INDEPENDENT_AMBULATORY_CARE_PROVIDER_SITE_OTHER): Payer: BC Managed Care – PPO | Admitting: Pulmonary Disease

## 2011-10-14 ENCOUNTER — Encounter: Payer: Self-pay | Admitting: Pulmonary Disease

## 2011-10-14 VITALS — BP 128/86 | HR 91 | Temp 98.5°F | Ht 64.0 in | Wt 197.6 lb

## 2011-10-14 DIAGNOSIS — J45909 Unspecified asthma, uncomplicated: Secondary | ICD-10-CM

## 2011-10-14 DIAGNOSIS — Z23 Encounter for immunization: Secondary | ICD-10-CM

## 2011-10-14 DIAGNOSIS — R059 Cough, unspecified: Secondary | ICD-10-CM

## 2011-10-14 DIAGNOSIS — R05 Cough: Secondary | ICD-10-CM

## 2011-10-14 NOTE — Progress Notes (Signed)
  Subjective:    Patient ID: Lynn Campos, female    DOB: 05/31/1971, 40 y.o.   MRN: [id number]  HPI The patient comes in today for followup of possible asthma.  At the last visit, the decision was made to treat the patient empirically with inhaled corticosteroids versus proceeding with a methacholine challenge test.  The patient was started on Qvar, and has seen significant improvement in her cough and also her breathing.  She has also seen improvement in her exertional tolerance.   Review of Systems  Constitutional: Negative for fever and unexpected weight change.  HENT: Negative for ear pain, nosebleeds, congestion, sore throat, rhinorrhea, sneezing, trouble swallowing, dental problem, postnasal drip and sinus pressure.   Eyes: Negative for redness and itching.  Respiratory: Positive for cough. Negative for chest tightness, shortness of breath and wheezing.   Cardiovascular: Negative for palpitations and leg swelling.  Gastrointestinal: Negative for nausea and vomiting.  Genitourinary: Negative for dysuria.  Musculoskeletal: Negative for joint swelling.  Skin: Negative for rash.  Neurological: Negative for headaches.  Hematological: Does not bruise/bleed easily.  Psychiatric/Behavioral: Negative for dysphoric mood. The patient is not nervous/anxious.        Objective:   Physical Exam Overweight female in no acute distress Nose without purulence or discharge noted Chest totally clear to auscultation Heart exam with regular rate and rhythm Lower extremities without edema, no cyanosis noted Alert and oriented, moves all 4 extremities.       Assessment & Plan:

## 2011-10-14 NOTE — Patient Instructions (Signed)
Stop qvar Start asmanex 2 inhalations each night at bedtime.  If doing well after 8 weeks, can decrease to one inhalation at bedtime as a trial.  Keep mouth rinsed well after use. Check with insurance to see which medication is more affordable.   Continue albuterol if needed for rescue, but call if needing more than 2 times a week. followup with me in 6mos.

## 2011-10-14 NOTE — Assessment & Plan Note (Signed)
The patient has had a significant response to daily inhaled corticosteroids, and therefore clinically she has asthma.  She has a history of asthma as a child, atopy by allergy testing in the past, and also recurrent cough and pulmonary symptoms that responded well to prednisone and then inhaled corticosteroids on multiple occasions.  Her cough has resolved, and her exertional tolerance has greatly improved since being on Qvar consistently.  I would like to continue her on inhaled corticosteroids, and have given her the option of a once daily medication.  We'll start her on Asmanex at h.s. As a trial, and she will check her insurance coverage and let us know.

## 2011-10-25 ENCOUNTER — Ambulatory Visit (INDEPENDENT_AMBULATORY_CARE_PROVIDER_SITE_OTHER): Payer: BC Managed Care – PPO | Admitting: Family

## 2011-10-25 ENCOUNTER — Encounter: Payer: Self-pay | Admitting: Family

## 2011-10-25 DIAGNOSIS — H6693 Otitis media, unspecified, bilateral: Secondary | ICD-10-CM

## 2011-10-25 DIAGNOSIS — H6690 Otitis media, unspecified, unspecified ear: Secondary | ICD-10-CM

## 2011-10-25 MED ORDER — AMOXICILLIN-POT CLAVULANATE 875-125 MG PO TABS: 1.0000 | ORAL_TABLET | Freq: Two times a day (BID) | ORAL | Status: AC

## 2011-10-25 NOTE — Patient Instructions (Signed)
  Call if your symptoms worsen or if you are not feeling better in 2-3 days.

## 2011-10-25 NOTE — Progress Notes (Signed)
Subjective:    Patient ID: Lynn Campos, female    DOB: [birth year], 40 y.o.   MRN: [id number]  HPI  Lynn Campos is a 40 yr old female who presents today with chief complaint of sinus pressure.  She reports that symptoms started 4 days ago. Notes progressive worsening of her symptoms.  Initially started as a scratchy throat.  She has had low grade temp <100.  Yesterday she had left sided ear pain.  She has tried otc ibuprofen and sudafed without any significant improvement.    Review of Systems See HPI  Past Medical History  Diagnosis Date  . Mitral regurgitation   . Dyspnea   . GERD (gastroesophageal reflux disease)   . Lupus   . Palpitations   . Asthma   . Rosacea     History   Social History  . Marital Status: Married    Spouse Name: N/A    Number of Children: 2  . Years of Education: N/A   Occupational History  .     Marland Kitchen Teacher-- [employer]    Social History Main Topics  . Smoking status: Never Smoker   . Smokeless tobacco: Not on file  . Alcohol Use: No  . Drug Use: No  . Sexually Active: Not on file   Other Topics Concern  . Not on file   Social History Narrative  . No narrative on file    Past Surgical History  Procedure Date  . Appendectomy   . Cholecystectomy 05/2010  . Cervical spine surgery 01/2010    Family History  Problem Relation Age of Onset  . Coronary artery disease Mother   . Coronary artery disease Father   . Hypertension Mother   . Stroke Mother   . Allergies Sister   . Asthma Sister   . Rheum arthritis Mother   . Rheum arthritis Maternal Grandmother     Allergies  Allergen Reactions  . Codeine     REACTION: vomiting  . Flovent (Fluticasone Propionate)     Difficulty breathing    Current Outpatient Prescriptions on File Prior to Visit  Medication Sig Dispense Refill  . albuterol (PROVENTIL HFA) 108 (90 BASE) MCG/ACT inhaler Inhale 2 puffs into the lungs every 4 (four) hours as needed for wheezing. 1-2  puffs every 4-6 hrs prn cough/wheezing  1 Inhaler  3  . beclomethasone (QVAR) 80 MCG/ACT inhaler Inhale 2 puffs into the lungs 2 (two) times daily.        . cyclobenzaprine (FLEXERIL) 10 MG tablet Take 10 mg by mouth daily as needed.        . isoniazid (NYDRAZID) 300 MG tablet Take 1 tablet by mouth daily.      . meloxicam (MOBIC) 7.5 MG tablet as needed.      Marland Kitchen oxyCODONE-acetaminophen (PERCOCET) 5-325 MG per tablet as needed.        . pyridoxine (B-6) 50 MG tablet Take 1 tablet (50 mg total) by mouth daily.  90 tablet  3  . ranitidine (ZANTAC 150 MAXIMUM STRENGTH) 150 MG tablet Take 150 mg by mouth 2 (two) times daily.          BP 118/88  Pulse 96  Temp(Src) 98.8 F (37.1 C) (Oral)  Resp 16  Wt 193 lb 0.6 oz (87.562 kg)       Objective:   Physical Exam  Constitutional: She appears well-developed and well-nourished.  HENT:  Head: Normocephalic and atraumatic.       Bilateral  TM effusions (yellow fluid noted behind membrane) TM's are dull no erythema.   Cardiovascular: Normal rate and regular rhythm.   No murmur heard. Pulmonary/Chest: Effort normal and breath sounds normal. No respiratory distress. She has no wheezes. She has no rales.  Lymphadenopathy:    She has cervical adenopathy.  Psychiatric: She has a normal mood and affect. Her behavior is normal. Thought content normal.          Assessment & Plan:

## 2011-10-25 NOTE — Assessment & Plan Note (Signed)
Bilateral OM and probable early sinusitis.  Will plan to treat with Augmentin.

## 2012-04-13 ENCOUNTER — Ambulatory Visit: Payer: BC Managed Care – PPO | Admitting: Pulmonary Disease

## 2012-06-26 ENCOUNTER — Encounter: Payer: Self-pay | Admitting: Pulmonary Disease

## 2012-06-26 ENCOUNTER — Ambulatory Visit (INDEPENDENT_AMBULATORY_CARE_PROVIDER_SITE_OTHER): Payer: BC Managed Care – PPO | Admitting: Pulmonary Disease

## 2012-06-26 VITALS — BP 112/80 | HR 93 | Temp 98.2°F | Ht 64.0 in | Wt 192.8 lb

## 2012-06-26 DIAGNOSIS — J45909 Unspecified asthma, uncomplicated: Secondary | ICD-10-CM

## 2012-06-26 MED ORDER — ALBUTEROL SULFATE HFA 108 (90 BASE) MCG/ACT IN AERS: 2.0000 | INHALATION_SPRAY | RESPIRATORY_TRACT | Status: DC | PRN

## 2012-06-26 MED ORDER — BECLOMETHASONE DIPROPIONATE 80 MCG/ACT IN AERS: 2.0000 | INHALATION_SPRAY | Freq: Two times a day (BID) | RESPIRATORY_TRACT | Status: DC

## 2012-06-26 NOTE — Assessment & Plan Note (Signed)
There is no question the patient has asthma, and does extremely well as long as she takes inhaled corticosteroids on a regular basis.  I have again reviewed the pathophysiology of asthma with her, including the role of airway inflammation.  I hope she now understands this must be suppressed in order for her to remain stable.

## 2012-06-26 NOTE — Progress Notes (Signed)
  Subjective:    Patient ID: Lynn Campos, female    DOB: May 11, 1971, 41 y.o.   MRN: [id number]  HPI The patient comes in today for followup of her asthma.  She was doing extremely well on Qvar, with no shortness of breath, wheezing or cough, but ran out approximately one month ago.  2 weeks later, she began to notice increasing pulmonary symptoms, and has required her rescue inhaler.  She now truly believes that she does have asthma, and and understands that she has to maintain on chronic medication.   Review of Systems  Constitutional: Negative.  Negative for fever and unexpected weight change.  HENT: Negative.  Negative for ear pain, nosebleeds, congestion, sore throat, rhinorrhea, sneezing, trouble swallowing, dental problem, postnasal drip and sinus pressure.   Eyes: Negative.  Negative for redness and itching.  Respiratory: Positive for cough, shortness of breath and wheezing. Negative for chest tightness.   Cardiovascular: Negative for palpitations and leg swelling.  Gastrointestinal: Negative.  Negative for nausea and vomiting.  Genitourinary: Negative.  Negative for dysuria.  Musculoskeletal: Negative.  Negative for joint swelling.  Skin: Negative.  Negative for rash.  Neurological: Negative.  Negative for headaches.  Hematological: Negative.  Does not bruise/bleed easily.  Psychiatric/Behavioral: Negative.  Negative for dysphoric mood. The patient is not nervous/anxious.        Objective:   Physical Exam Ow female in nad Nose without purulence or discharge noted. Chest with mild decrease in bs, no wheezing or rhonchi Cor with rrr LE without edema, no cyanosis Alert and oriented, moves all 4.        Assessment & Plan:

## 2012-06-26 NOTE — Patient Instructions (Addendum)
Get back on qvar as directed, and keep mouth rinsed well. Will also fill albuterol to use as needed. followup with me in one year if doing well.

## 2012-07-23 ENCOUNTER — Telehealth: Payer: Self-pay | Admitting: *Deleted

## 2012-07-23 NOTE — Telephone Encounter (Signed)
[facility] Triage Call Report Triage Record Num: [id number] Operator: Ether Griffins Patient Name: Lynn Campos Call Date & Time: 07/22/2012 5:04:49PM Patient Phone: [phone] PCP: Lezlie Octave Patient Gender: Female PCP Fax : [phone] Patient DOB: [birth year] Practice Name: [facility] Reason for Call: Caller: Aeris/Patient; PCP: Sheliah Hatch.; CB#: [phone]; Calling to schedule an appointment to have labwork done. Went to see eye doctor and eye sight has changed drastically--they recommended having labwork done to test for diabetes. Currently not having any other symptoms. Appointment scheduled with Dr Beverely Low on 07/24/12 @ 0930. Protocol(s) Used: Information Only Calls, No Triage Recommended Outcome per Protocol: Provide Information or Advice Only Reason for Outcome: Requesting regular office appointment (Advise to call office when open.) Care Advice:

## 2012-07-23 NOTE — Telephone Encounter (Signed)
Noted.  Will see when she arrives

## 2012-07-23 NOTE — Telephone Encounter (Signed)
Noted apt tomorrow

## 2012-07-24 ENCOUNTER — Encounter: Payer: Self-pay | Admitting: *Deleted

## 2012-07-24 ENCOUNTER — Ambulatory Visit (INDEPENDENT_AMBULATORY_CARE_PROVIDER_SITE_OTHER): Payer: BC Managed Care – PPO | Admitting: Family Medicine

## 2012-07-24 ENCOUNTER — Encounter: Payer: Self-pay | Admitting: Family Medicine

## 2012-07-24 VITALS — BP 118/80 | HR 78 | Temp 98.3°F | Ht 63.5 in | Wt 187.0 lb

## 2012-07-24 DIAGNOSIS — N390 Urinary tract infection, site not specified: Secondary | ICD-10-CM | POA: Insufficient documentation

## 2012-07-24 DIAGNOSIS — H539 Unspecified visual disturbance: Secondary | ICD-10-CM

## 2012-07-24 LAB — POCT URINALYSIS DIPSTICK
Nitrite, UA: NEGATIVE
Spec Grav, UA: 1.02
Urobilinogen, UA: 0.2
pH, UA: 6.5

## 2012-07-24 LAB — BASIC METABOLIC PANEL
BUN: 12 mg/dL (ref 6–23)
CO2: 27 mEq/L (ref 19–32)
Chloride: 104 mEq/L (ref 96–112)
Creatinine, Ser: 0.8 mg/dL (ref 0.4–1.2)
Potassium: 4.1 mEq/L (ref 3.5–5.1)

## 2012-07-24 LAB — TSH: TSH: 1.09 u[IU]/mL (ref 0.35–5.50)

## 2012-07-24 LAB — HEMOGLOBIN A1C: Hgb A1c MFr Bld: 5.3 % (ref 4.6–6.5)

## 2012-07-24 MED ORDER — CEPHALEXIN 500 MG PO CAPS: 500.0000 mg | ORAL_CAPSULE | Freq: Two times a day (BID) | ORAL | Status: AC

## 2012-07-24 NOTE — Telephone Encounter (Signed)
Pt was treated by MD Beverely Low today in office

## 2012-07-24 NOTE — Assessment & Plan Note (Signed)
New.  Pt's sxs and PE consistent w/ infxn.  Start abx.  Reviewed supportive care and red flags that should prompt return.  Pt expressed understanding and is in agreement w/ plan.  

## 2012-07-24 NOTE — Progress Notes (Signed)
  Subjective:    Patient ID: Lynn Campos, female    DOB: 10-Apr-1971, 41 y.o.   MRN: [id number]  HPI Rapid visual changes- went to eye doctor b/c she was having difficulty w/ distance vision.  Bilateral vision changes that were 'significant enough to cause him concern'.  Denies polyphagia, polydipsia, increased thirst.  + family hx of diabetes which is what eye MD was concerned about.  Denies heat or cold intolerance, excessive fatigue.  Urinary frequency- sxs started 4 days ago, accompanied by pressure.  No hematuria, no fevers, no back pain.  Denies dysuria.  + hesitancy.   Review of Systems For ROS see HPI     Objective:   Physical Exam  Vitals reviewed. Constitutional: She is oriented to person, place, and time. She appears well-developed and well-nourished. No distress.  HENT:  Head: Normocephalic and atraumatic.  Eyes: Conjunctivae and EOM are normal. Pupils are equal, round, and reactive to light.  Neck: Normal range of motion. Neck supple. No thyromegaly present.  Cardiovascular: Normal rate, regular rhythm, normal heart sounds and intact distal pulses.   No murmur heard. Pulmonary/Chest: Effort normal and breath sounds normal. No respiratory distress.  Abdominal: Soft. She exhibits no distension. There is no tenderness.  Musculoskeletal: She exhibits no edema.  Lymphadenopathy:    She has no cervical adenopathy.  Neurological: She is alert and oriented to person, place, and time.  Skin: Skin is warm and dry.  Psychiatric: She has a normal mood and affect. Her behavior is normal.          Assessment & Plan:

## 2012-07-24 NOTE — Patient Instructions (Addendum)
We'll notify you of your lab results and make any changes if needed Start the keflex twice daily for the UTI Drink plenty of fluids Call with any questions or concerns Happy Labor Day!!!

## 2012-07-24 NOTE — Assessment & Plan Note (Signed)
New.  Eye doctor concerned about possible metabolic cause.  Pt's last fasting sugar was 84 (2011).  Denies current sxs of DM.  Thyroid could cause rapid visual changes.  Check TSH.  Address abnormalities if present, otherwise pt will adjust contact prescription and f/u w/ eye doctor.  Pt expressed understanding and is in agreement w/ plan.

## 2012-07-27 LAB — URINE CULTURE

## 2012-08-27 ENCOUNTER — Ambulatory Visit (INDEPENDENT_AMBULATORY_CARE_PROVIDER_SITE_OTHER): Payer: BC Managed Care – PPO | Admitting: Physician Assistant

## 2012-08-27 VITALS — BP 135/84 | HR 97 | Temp 98.7°F | Resp 16 | Ht 63.75 in | Wt 189.2 lb

## 2012-08-27 DIAGNOSIS — Z23 Encounter for immunization: Secondary | ICD-10-CM

## 2012-08-27 DIAGNOSIS — Z0000 Encounter for general adult medical examination without abnormal findings: Secondary | ICD-10-CM

## 2012-08-27 DIAGNOSIS — Z111 Encounter for screening for respiratory tuberculosis: Secondary | ICD-10-CM

## 2012-08-27 NOTE — Progress Notes (Signed)
  Subjective:    Patient ID: Lynn Campos, female    DOB: 04/29/1971, 41 y.o.   MRN: [id number]  HPI 41 year old female presents for complete physical and form completion. She is a Engineer, technical sales at [employer]. Only medical problem is asthma that is controlled. She does have a history of cervical vertebrae fusion that her neurosurgeon restricts weight lifting to <15 lbs. Otherwise doing well with no complaints. She does have a complete physical scheduled with her PCP at the beginning of next year, so does not wish to have bloodwork done today.      Review of Systems  All other systems reviewed and are negative.       Objective:   Physical Exam  Constitutional: She is oriented to person, place, and time. She appears well-developed and well-nourished.  HENT:  Head: Normocephalic and atraumatic.  Right Ear: Hearing, tympanic membrane, external ear and ear canal normal.  Left Ear: Hearing, tympanic membrane, external ear and ear canal normal.  Mouth/Throat: Uvula is midline, oropharynx is clear and moist and mucous membranes are normal. No oropharyngeal exudate.  Eyes: EOM are normal. Pupils are equal, round, and reactive to light.  Neck: Normal range of motion. Neck supple. No thyromegaly present.  Cardiovascular: Normal rate, regular rhythm and normal heart sounds.   Pulmonary/Chest: Effort normal and breath sounds normal.  Abdominal: Soft. Bowel sounds are normal. There is no tenderness.  Musculoskeletal: Normal range of motion.  Lymphadenopathy:    She has no cervical adenopathy.  Neurological: She is alert and oriented to person, place, and time.  Psychiatric: She has a normal mood and affect. Her behavior is normal. Judgment and thought content normal.          Assessment & Plan:   1. Routine general medical examination at a health care facility    2. Need for prophylactic vaccination and inoculation against influenza  Flu vaccine greater than or equal to 3yo preservative  free IM  3. Screening for tuberculosis     Follow up with PCP for labwork Papers completed Screened for TB and found to have no risk factors.   Follow up prn.

## 2012-08-27 NOTE — Progress Notes (Signed)
  Tuberculosis Risk Questionnaire  1. Were you born outside the [place] in one of the following parts of the world:    [place], [place], [place], [place] or [place]?  No  2. Have you traveled outside the [place] and lived for more than one month in one of the following parts of the world:  [place], [place], [place], [place] or [place]?  No  3. Do you have a compromised immune system such as from any of the following conditions:  HIV/AIDS, organ or bone marrow transplantation, diabetes, immunosuppressive medicines (e.g. Prednisone, Remicaide), leukemia, lymphoma, cancer of the head or neck, gastrectomy or jejunal bypass, end-stage renal disease (on   dialysis), or silicosis?  No    4. Have you ever done one of the following:    Used crack cocaine, injected illegal drugs, worked or resided in jail or prison, worked or resided at a homeless shelter, or worked as a Research scientist (physical sciences) in direct contact with patients?  No  5. Have you ever been exposed to anyone with infectious tuberculosis?  No   Tuberculosis Symptom Questionnaire  Do you currently have any of the following symptoms?  1. Unexplained cough lasting more than 3 weeks? No  Unexplained fever lasting more than 3 weeks. No   3. Night Sweats (sweating that leaves the bedclothes and sheets wet)   No  4. Shortness of Breath No  5. Chest Pain No  6. Unintentional weight loss  No  7. Unexplained fatigue (very tired for no reason) No

## 2012-09-11 ENCOUNTER — Encounter: Payer: BC Managed Care – PPO | Admitting: Family Medicine

## 2012-11-19 ENCOUNTER — Ambulatory Visit (INDEPENDENT_AMBULATORY_CARE_PROVIDER_SITE_OTHER): Payer: BC Managed Care – PPO | Admitting: Family Medicine

## 2012-11-19 ENCOUNTER — Encounter: Payer: Self-pay | Admitting: Family Medicine

## 2012-11-19 VITALS — BP 118/80 | HR 86 | Temp 98.5°F | Ht 63.5 in | Wt 191.4 lb

## 2012-11-19 DIAGNOSIS — L570 Actinic keratosis: Secondary | ICD-10-CM | POA: Insufficient documentation

## 2012-11-19 DIAGNOSIS — L919 Hypertrophic disorder of the skin, unspecified: Secondary | ICD-10-CM

## 2012-11-19 DIAGNOSIS — L909 Atrophic disorder of skin, unspecified: Secondary | ICD-10-CM

## 2012-11-19 DIAGNOSIS — J0100 Acute maxillary sinusitis, unspecified: Secondary | ICD-10-CM

## 2012-11-19 DIAGNOSIS — L918 Other hypertrophic disorders of the skin: Secondary | ICD-10-CM | POA: Insufficient documentation

## 2012-11-19 DIAGNOSIS — L851 Acquired keratosis [keratoderma] palmaris et plantaris: Secondary | ICD-10-CM

## 2012-11-19 MED ORDER — AMOXICILLIN 875 MG PO TABS: 875.0000 mg | ORAL_TABLET | Freq: Two times a day (BID) | ORAL | Status: AC

## 2012-11-19 NOTE — Progress Notes (Signed)
  Subjective:    Patient ID: Lynn Campos, female    DOB: Mar 14, 1971, 41 y.o.   MRN: [id number]  HPI Sinus pain- sxs started 10 days ago.  No nasal congestion or drainage.  Some bleeding sinuses.  Some sporadic low grade fevers.  Taking 'a lot of ibuprofen' for the sore throat.  Sore throat started Monday.  No cough.  + sick contacts.  Skin tag removal on R flank- area will occasionally bleed from rubbing on bra  Keratosis shave on R flank- 'it's so ugly'.  Flaky, changing in texture   Review of Systems For ROS see HPI     Objective:   Physical Exam  Vitals reviewed. Constitutional: She appears well-developed and well-nourished. No distress.  HENT:  Head: Normocephalic and atraumatic.  Right Ear: Tympanic membrane normal.  Left Ear: Tympanic membrane normal.  Nose: Mucosal edema and rhinorrhea present. Right sinus exhibits maxillary sinus tenderness and frontal sinus tenderness. Left sinus exhibits maxillary sinus tenderness and frontal sinus tenderness.  Mouth/Throat: Uvula is midline and mucous membranes are normal. Posterior oropharyngeal erythema present. No oropharyngeal exudate.  Eyes: Conjunctivae normal and EOM are normal. Pupils are equal, round, and reactive to light.  Neck: Normal range of motion. Neck supple.  Cardiovascular: Normal rate, regular rhythm and normal heart sounds.   Pulmonary/Chest: Effort normal and breath sounds normal. No respiratory distress. She has no wheezes.  Lymphadenopathy:    She has no cervical adenopathy.  Skin: Skin is warm and dry.       <1 cm skin tag on R flank 1.5 cm scaling, keratosis inferior to skin tag on R flank          Assessment & Plan:

## 2012-11-19 NOTE — Patient Instructions (Addendum)
This is a sinus infection- start the Amox twice daily, take w/ food Alternate tylenol/ibuprofen every 4 hrs for fever or pain Mucinex to thin your congestion Drink plenty of fluids REST! Hang in there!!!

## 2012-11-20 NOTE — Assessment & Plan Note (Signed)
New.  Area cleaned w/ ETOH and betadine, numbed w/ 1 cc lido w/ epi.  Using a razor blade, keratosis was removed w/ single shave.  Hemostasis was achieved w/ pressure, abx ointment and band-aid applied.  Specimen to path.

## 2012-11-20 NOTE — Assessment & Plan Note (Signed)
Pt's sxs and PE consistent w/ infxn.  Start abx.  Reviewed supportive care and red flags that should prompt return.  Pt expressed understanding and is in agreement w/ plan.  

## 2012-11-20 NOTE — Assessment & Plan Note (Signed)
New.  Area prepped w/ ETOH and betadine.  Numbed w/ 1% lido w/ epi (1 cc).  <1 cm tag snipped w/ surgical scissors at level of skin.  No bleeding.  abx ointment and bandaid applied.

## 2012-12-11 ENCOUNTER — Ambulatory Visit (INDEPENDENT_AMBULATORY_CARE_PROVIDER_SITE_OTHER): Payer: BC Managed Care – PPO | Admitting: Family Medicine

## 2012-12-11 ENCOUNTER — Encounter: Payer: Self-pay | Admitting: Family Medicine

## 2012-12-11 VITALS — BP 110/80 | HR 72 | Temp 98.2°F | Ht 64.5 in | Wt 191.0 lb

## 2012-12-11 DIAGNOSIS — M25519 Pain in unspecified shoulder: Secondary | ICD-10-CM

## 2012-12-11 DIAGNOSIS — M62838 Other muscle spasm: Secondary | ICD-10-CM | POA: Insufficient documentation

## 2012-12-11 MED ORDER — MELOXICAM 15 MG PO TABS: 15.0000 mg | ORAL_TABLET | Freq: Every day | ORAL | Status: DC

## 2012-12-11 MED ORDER — CYCLOBENZAPRINE HCL 10 MG PO TABS: 10.0000 mg | ORAL_TABLET | Freq: Every day | ORAL | Status: DC | PRN

## 2012-12-11 NOTE — Assessment & Plan Note (Signed)
New.  Restart Flexeril.  Heat.  Gentle ROM exercises.  Pt to call if no improvement.

## 2012-12-11 NOTE — Progress Notes (Signed)
  Subjective:    Patient ID: Lynn Campos, female    DOB: 05-Jan-1971, 42 y.o.   MRN: [id number]  HPI Back pain- was walking down incline in the mulch when L leg slipped out from underneath her.  Had instant pain in back.  Now having R neck pain and L shoulder pain.  + muscle spasm.  Taking 800mg  ibuprofen round the clock.  Used daughter's left over robaxin w/out relief.  Called neuro surg for refill on flexeril and was told she needed to come in.  Has known rotator cuff tears in L shoulder- receiving steroid injxns w/ Dr Ranell Patrick.  All of this started on Tuesday.   Review of Systems For ROS see HPI     Objective:   Physical Exam  Vitals reviewed. Constitutional: She is oriented to person, place, and time. She appears well-developed and well-nourished.       Obviously uncomfortable  HENT:  Head: Normocephalic and atraumatic.  Eyes: Conjunctivae normal and EOM are normal. Pupils are equal, round, and reactive to light.  Neck: Neck supple.       + R trap spasm + TTP over C6 Good neck flexion/extension, limited R rotation due to fusion  Musculoskeletal:       Left shoulder: She exhibits decreased range of motion (mildly, pt reports most of this is known rotator cuff pathology), tenderness, pain and spasm. She exhibits no bony tenderness, no swelling, no effusion, no crepitus, normal pulse and normal strength.  Neurological: She is alert and oriented to person, place, and time. She has normal reflexes. No cranial nerve deficit. Coordination normal.          Assessment & Plan:

## 2012-12-11 NOTE — Assessment & Plan Note (Signed)
New to provider, ongoing for pt.  Following w/ ortho for known rotator cuff pathology.  Has injxn pending next month.  Increase daily NSAID- mobic 15mg .  Add flexeril.  If pain fails to improve or worsens, pt to call and we'll get her in w/ ortho earlier than planned.  Pt expressed understanding and is in agreement w/ plan.

## 2012-12-11 NOTE — Patient Instructions (Addendum)
Start the Mobic daily x2 weeks- take w/ food Use the flexeril as needed for spasm HEAT! Percocet as needed for severe pain REST! Hang in there!!!

## 2012-12-14 ENCOUNTER — Telehealth: Payer: Self-pay | Admitting: *Deleted

## 2012-12-14 MED ORDER — OXYCODONE-ACETAMINOPHEN 5-325 MG PO TABS: 1.0000 | ORAL_TABLET | ORAL | Status: DC | PRN

## 2012-12-14 NOTE — Telephone Encounter (Signed)
Called pt to let her know that her rx is ready to be picked up at front desk. Pt then stated that Saturday evening she started vomiting & on into Sunday. Pt stated that her eyes were red & one was started bleeding a little on Sunday. Her eye is no longer bleeding but both eyes are black & blue & she has been applying a cold compress. Pt states her eyes are starting to look better today, she called her eye doctor but his office is closed today. Pt would like to know if there is anything else she should do. Please adivse.

## 2012-12-14 NOTE — Telephone Encounter (Signed)
Got it, printed Rx for #20

## 2012-12-14 NOTE — Telephone Encounter (Signed)
Please see OV from 12-11-12. Pt instruction. Pt just wants something for pain.Marland KitchenPlease advise

## 2012-12-14 NOTE — Telephone Encounter (Signed)
rec to go to the UC or see PCP in AM if sx not severe

## 2012-12-14 NOTE — Telephone Encounter (Signed)
This office has not prescribed Percocet to the patient. Unable to RF.

## 2012-12-14 NOTE — Telephone Encounter (Signed)
Pt seen on 12-11-12 for back pain. Pt states that she thought that she had more Percocet from a old Rx left at home but when she got there she realized that she only had 3 tabs left. Pt would like to get a new Rx for a pain med. .Please advise

## 2012-12-14 NOTE — Telephone Encounter (Signed)
Spoke to pt, she states that she would like to call her eye doctor in the morning to see if she can get in to see him, if she cant then she will call to schedule an appt with Dr. Beverely Low.

## 2012-12-26 ENCOUNTER — Encounter: Payer: Self-pay | Admitting: Internal Medicine

## 2012-12-26 ENCOUNTER — Ambulatory Visit (INDEPENDENT_AMBULATORY_CARE_PROVIDER_SITE_OTHER): Payer: BC Managed Care – PPO | Admitting: Internal Medicine

## 2012-12-26 VITALS — BP 126/88 | HR 117 | Temp 99.2°F | Resp 12 | Wt 186.0 lb

## 2012-12-26 DIAGNOSIS — R21 Rash and other nonspecific skin eruption: Secondary | ICD-10-CM

## 2012-12-26 DIAGNOSIS — R509 Fever, unspecified: Secondary | ICD-10-CM

## 2012-12-26 DIAGNOSIS — R52 Pain, unspecified: Secondary | ICD-10-CM

## 2012-12-26 DIAGNOSIS — J310 Chronic rhinitis: Secondary | ICD-10-CM

## 2012-12-26 DIAGNOSIS — J209 Acute bronchitis, unspecified: Secondary | ICD-10-CM

## 2012-12-26 LAB — POCT INFLUENZA A/B: Influenza A, POC: NEGATIVE

## 2012-12-26 MED ORDER — AZITHROMYCIN 250 MG PO TABS: ORAL_TABLET | ORAL | Status: DC

## 2012-12-26 MED ORDER — METRONIDAZOLE 0.75 % EX GEL: Freq: Two times a day (BID) | CUTANEOUS | Status: DC

## 2012-12-26 NOTE — Patient Instructions (Addendum)
Plain Mucinex (NOT D) for thick secretions ;force NON dairy fluids .   Nasal cleansing in the shower as discussed with lather of mild shampoo.After 10 seconds wash off lather while  exhaling through nostrils. Make sure that all residual soap is removed to prevent irritation.  Use a Neti pot daily only  as needed for significant sinus congestion; going from open side to congested side . Plain Allegra (NOT D )  160 daily , Loratidine 10 mg , OR Zyrtec 10 mg @ bedtime  as needed for itchy eyes & sneezing.      Trial of Metronidazole for facial rash

## 2012-12-26 NOTE — Progress Notes (Signed)
  Subjective:    Patient ID: Lynn Campos, female    DOB: October 01, 1971, 42 y.o.   MRN: [id number]  HPI The respiratory tract symptoms began 12/25/12 as  cough with white to yellow/brown sputum.  Significant active  associated symptoms include frontal headache & facial pain. Cough is not associated with  shortness of breath and wheezing .  Fever , chills and sweats were  present       Myalgias and arthralgias were  present  Flu shot  current        Treatment with  NSAIDS, Tylenol,Mucinex, Delsym , Tessalon perles was partially effective. No rescue MDI use ; Qvar used as maintenace.   There is  history of asthma & seasonal  allergies. The patient had never smoked .                Review of Systems Symptoms not present include dental pain, sore throat, nasal purulence, earache , and otic discharge Itchy , watery eyes & sneezing were not noted.      Objective:   Physical Exam General appearance:good health ;well nourished; no acute distress or increased work of breathing is present.  No  lymphadenopathy about the head, neck, or axilla noted.  Eyes: No conjunctival inflammation or lid edema is present.  Ears:  External ear exam shows no significant lesions or deformities.  Otoscopic examination reveals clear canals, tympanic membranes are intact bilaterally without bulging, retraction, inflammation or discharge. Nose:  External nasal examination shows no deformity or inflammation. Nasal mucosa are dry without lesions or exudates. No septal dislocation or deviation.No obstruction to airflow.  Oral exam: Dental hygiene is good; lips and gums are healthy appearing.There is no oropharyngeal erythema or exudate noted.  Neck:  No deformities,  masses, or tenderness noted.    Heart:  Normal rate and regular rhythm. S1 and S2 normal without gallop, murmur, click, rub or other extra sounds.  Lungs:Chest clear to auscultation; no wheezes, rhonchi,rales ,or rubs present.No  increased work of breathing.  Dry cough Extremities:  No cyanosis, edema, or clubbing  noted  Skin: Warm & dry . Malar rash         Assessment & Plan:  #1 acute bronchitis w/o bronchospasm #2 facial rash; PMH of variable lupus test results Plan: See orders and recommendations

## 2012-12-28 ENCOUNTER — Telehealth: Payer: Self-pay | Admitting: Family Medicine

## 2012-12-28 NOTE — Telephone Encounter (Signed)
[facility] Triage Call Report Triage Record Num: [id number] Operator: Bennie Hind Patient Name: Lynn Campos Call Date & Time: 12/26/2012 11:33:19AM Patient Phone: [phone] PCP: Lezlie Octave Patient Gender: Female PCP Fax : [phone] Patient DOB: 07/30/1971 Practice Name: [facility] Reason for Call: Caller: Jamie/Patient; PCP: Sheliah Hatch.; CB#: [phone]; Call regarding Flu like symtoms ; 12-26-12 onset last night of cough and aching some today her fever is 102, her body aches have increased and she has a congested non productive cough Hx asthma has not required rescue inhaler yet She did get flu shot. All emergent sxs per Flu Like Symptoms ruled out except for new or worsening flu like illness and in high risk group for complications Home care advice given and appt made for today at 1215 Protocol(s) Used: Flu-Like Symptoms Recommended Outcome per Protocol: Call Provider within 8 Hours Reason for Outcome: New OR worsening flu-like illness AND in high risk group for complications Care Advice: ~ Use a cool mist humidifier to moisten air. Be sure to clean according to manufacturer's instructions. ~ Rest until temperature returns to normal. Analgesic/Antipyretic Advice - NSAIDs: Consider aspirin, ibuprofen, naproxen or ketoprofen for pain or fever as directed on label or by pharmacist/provider. PRECAUTIONS: - If over 69 years of age, should not take longer than 1 week without consulting provider. EXCEPTIONS: - Should not be used if taking blood thinners or have bleeding problems. - Do not use if have history of sensitivity/allergy to any of these medications; or history of cardiovascular, ulcer, kidney, liver disease or diabetes unless approved by provider. - Do not exceed recommended dose or frequency. ~ See a provider immediately or go to the Emergency Department if having chest pain with breathing, change in level of consciousness, new seizure,  vomiting and unable to keep fluids down for 8 hours or more, or has not urinated for 8 or more hours. ~ Influenza - Expected Course: - Symptoms start to improve in 3 to 7 days. - Cough and feeling tired (malaise) may continue for several weeks. - Cough and extreme fatigue lasting more than 3 weeks needs medical evaluation. - Remain at home at least 24 hours after being free of fever 100F (37.8C) without the use of fever reducing medication. ~ Influenza Respiratory Hygiene: - Cover the nose/mouth tightly with a tissue when coughing or sneezing. - Use tissue one time and discard in the nearest waste receptacle. - Wash hands with soap and water or alcohol-based hand rub for at least 15 seconds after coming into contact with respiratory secretions and contaminated objects/materials. - When a tissue is not available, cough into the bend of the elbow. - Avoid touching your eyes, nose or mouth. - When ill wear a disposable face mask when around others, especially around those at high risk for flu-related complications, to help decrease the likelihood of infecting them. ~ Do not go to work, school, or any community activity until you have not had a fever (100F or 37.8C) for at least 24 hours without taking fever-reducing medication. This means staying at home for at least 3 to 5, possibly 7 days. ~ 12/26/2012 11:47:57AM Page 1 of 2 [id number] [facility] Triage Call Report Patient Name: Lynn Campos continuation page/s To help prevent a widespread community outbreak of the flu, call the call center, your clinic or provider's office to discuss any questions or concerns before going in unless you have severe respiratory or any nervous system symptoms. ~ Total water intake includes drinking water, water in  beverages, and water contained in food. Fluids make up about 80% of the body's total hydration need. Individual fluid requirement to maintain hydration vary based on physical activity,  environmental factors and illness. Limit fluids that contain sugar, caffeine, or alcohol. Urine will be very light yellow color when you drink enough fluids. ~

## 2012-12-28 NOTE — Telephone Encounter (Signed)
Spoke with the pt and she stated she saw Dr. Alwyn Ren in the clinic on Sat and she was given Z-pak, and she's feeling much better.//AB/CMA

## 2012-12-28 NOTE — Telephone Encounter (Signed)
Pt seen by Hopp on Friday- please call and see if she's feeling better

## 2013-04-02 ENCOUNTER — Telehealth: Payer: Self-pay | Admitting: Family Medicine

## 2013-04-02 NOTE — Telephone Encounter (Signed)
Please advise if ok to send in eye drops that are on standing order or does pt need an appt?

## 2013-04-02 NOTE — Telephone Encounter (Signed)
Ok to send eye drops 

## 2013-04-02 NOTE — Telephone Encounter (Signed)
Patient Information:  Caller Name: Merrilee  Phone: [phone]  Patient: Lynn Campos, Lynn Campos  Gender: Female  DOB: 07/30/1971  Age: 42 Years  PCP: Sheliah Hatch  Pregnant: No  Office Follow Up:  Does the office need to follow up with this patient?: No  Instructions For The Office: N/A  RN Note:  [facility] pharmacy per patient request [facility] at [facility] and [address]. [phone]. Standing order for Polytrim eye gtts. 2 drops both eyes QID x5 days, NR . per Dr. Beverely Low. Home care instrucitons and call back parameters reviewed with patient. Lauren at pharmacy will have medication ready in .  Symptoms  Reason For Call & Symptoms: Patient she believes she has pink eye in her right eye. ongoing x1 week. 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   Lashes are matted in the morning. Yellow drainage. slight discomfort but no pain. Lids are irritated , sclera pink/red.  Reviewed Health History In EMR: Yes  Reviewed Medications In EMR: Yes  Reviewed Allergies In EMR: Yes  Reviewed Surgeries / Procedures: Yes  Date of Onset of Symptoms: 03/26/2013  Treatments Tried: Warm compressess in the morning, clairtin  Treatments Tried Worked: No OB / GYN:  LMP: 03/09/2013  Guideline(s) Used:  Eye - Pus or Discharge  Disposition Per Guideline:   Home Care  Reason For Disposition Reached:   Eye with yellow/green discharge or eyelashes stick together, and PCP standing order to call in antibiotic eye drops  Advice Given:  Reassurance:  A small amount of pus or mucus in the inner corner of the eye is often due to a cold or virus. Sometimes it's just a reaction to an irritant in the eye.  You can get pink eye from someone who has had it recently.  Pink eye will not harm your eyesight.  Eyelid Cleansing:   Gently wash eyelids and lashes with warm water and wet cotton balls (or cotton gauze). Remove all the dried and liquid pus.  Do this as often as needed.  Contact Lenses:  Switch to  glasses for a short time. This will help stop damage to your eye.  Clean your contacts before wearing them again.  Throw away used contacts if they are meant to be thrown away.  Contagiousness:  Pinkeye is contagious. It is very easy to spread to other people. You can spread it by shaking hands.  Try not to touch your eyes. Wash your hands often. Do not share towels. Try not to touch your eyes. Wash your hands frequently. Do not share towels.  You may return to work or school. Avoid physical contact (e.g., shaking hands) until the symptoms have resolved.  Call Back If  Pus increases in amount  Pus in corner of eye lasts over 3 days  Eyelid becomes red or swollen  You become worse.  Patient Will Follow Care Advice:  YES

## 2013-04-02 NOTE — Telephone Encounter (Signed)
Follow up next week if no better

## 2013-04-05 MED ORDER — POLYMYXIN B-TRIMETHOPRIM 10000-0.1 UNIT/ML-% OP SOLN: 1.0000 [drp] | OPHTHALMIC | Status: DC

## 2013-04-05 NOTE — Telephone Encounter (Signed)
Med was filled. LM for pt.

## 2013-04-20 ENCOUNTER — Ambulatory Visit (INDEPENDENT_AMBULATORY_CARE_PROVIDER_SITE_OTHER): Payer: BC Managed Care – PPO | Admitting: Internal Medicine

## 2013-04-20 VITALS — BP 140/100 | HR 93 | Temp 98.1°F | Wt 188.0 lb

## 2013-04-20 DIAGNOSIS — J069 Acute upper respiratory infection, unspecified: Secondary | ICD-10-CM

## 2013-04-20 DIAGNOSIS — J209 Acute bronchitis, unspecified: Secondary | ICD-10-CM

## 2013-04-20 DIAGNOSIS — R030 Elevated blood-pressure reading, without diagnosis of hypertension: Secondary | ICD-10-CM

## 2013-04-20 MED ORDER — CEFUROXIME AXETIL 500 MG PO TABS: 500.0000 mg | ORAL_TABLET | Freq: Two times a day (BID) | ORAL | Status: DC

## 2013-04-20 NOTE — Patient Instructions (Addendum)
Plain Mucinex (NOT D) for thick secretions ;force NON dairy fluids .   Nasal cleansing in the shower as discussed with lather of mild shampoo.After 10 seconds wash off lather while  exhaling through nostrils. Make sure that all residual soap is removed to prevent irritation.  Use a Neti pot daily only  as needed for significant sinus congestion; going from open side to congested side . Plain Allegra (NOT D )  160 daily , Loratidine 10 mg , OR Zyrtec 10 mg @ bedtime  as needed for itchy eyes & sneezing.  Qvar one - two inhalations every 12 hours;exhale through nose.Please gargle and spit after use.

## 2013-04-20 NOTE — Progress Notes (Signed)
  Subjective:    Patient ID: Lynn Campos, female    DOB: September 23, 1971, 42 y.o.   MRN: [id number]  HPI Symptoms began approximately one month ago as cough in the context of extrinsic symptoms of itchy, watery eyes, and sneezing. Over the next 10 days she developed purulent nasal secretions. She took a ten-day course of Keflex  of her son that he did not use.  This did improve  the purulent nasal secretions but she had persistent sinus pressure and sore throat  As of 5/26 she developed fever which has risen as high as 101.4. The cough became markedly more productive with thick, brown sputum.  She's had some persistent facial pressure. Nasal secretions are intermittently discolored.  She does have a history of asthma.    Review of Systems   She denies otic discharge, significant shortness of breath or wheezing.   C. blood pressure; she has been using decongestants. She has a history of elevated blood pressure without diagnosis of hypertension     Objective:   Physical Exam General appearance:good health ;well nourished; no acute distress or increased work of breathing is present.  No  lymphadenopathy about the head, neck, or axilla noted.   Eyes: No conjunctival inflammation or lid edema is present.   Ears:  External ear exam shows no significant lesions or deformities.  Otoscopic examination reveals clear canals, tympanic membranes are intact bilaterally without bulging, retraction, inflammation or discharge.  Nose:  External nasal examination shows no deformity or inflammation. Nasal mucosa are pink and moist without lesions or exudates. No septal dislocation or deviation.No obstruction to airflow.   Oral exam: Dental hygiene is good; lips and gums are healthy appearing.There is no oropharyngeal erythema or exudate noted.   Neck:  No deformities, thyromegaly, masses, or tenderness noted.    Heart:  Normal rate and regular rhythm. S1 and S2 normal without gallop, murmur, click, rub or  other extra sounds.   Lungs:Chest clear to auscultation; no wheezes, rhonchi,rales ,or rubs present.No increased work of breathing.  Intermittent brassy cough  Extremities:  No cyanosis, edema, or clubbing  noted    Skin: damp. Mild erythema over the maxilla bilaterally        Assessment & Plan:  #1 acute bronchitis in the context of a history of asthma  #2 upper respiratory infection  #3 elevated blood pressure, possibly related to decongestants.  Plan: See orders and recommendations

## 2013-05-10 ENCOUNTER — Encounter: Payer: Self-pay | Admitting: Pulmonary Disease

## 2013-06-25 ENCOUNTER — Encounter: Payer: Self-pay | Admitting: Family Medicine

## 2013-06-25 ENCOUNTER — Ambulatory Visit (INDEPENDENT_AMBULATORY_CARE_PROVIDER_SITE_OTHER): Payer: BC Managed Care – PPO | Admitting: Family Medicine

## 2013-06-25 VITALS — BP 104/70 | HR 76 | Temp 98.4°F | Ht 64.5 in | Wt 191.0 lb

## 2013-06-25 DIAGNOSIS — Z9889 Other specified postprocedural states: Secondary | ICD-10-CM | POA: Insufficient documentation

## 2013-06-25 MED ORDER — OXYCODONE-ACETAMINOPHEN 5-325 MG PO TABS: 1.0000 | ORAL_TABLET | ORAL | Status: DC | PRN

## 2013-06-25 MED ORDER — CYCLOBENZAPRINE HCL 10 MG PO TABS: 10.0000 mg | ORAL_TABLET | Freq: Every day | ORAL | Status: DC | PRN

## 2013-06-25 NOTE — Patient Instructions (Addendum)
Follow up as needed Take the meds for pain as needed Heating pad or ice- whichever feels better Call with any questions or concerns Hang in there!!

## 2013-06-25 NOTE — Assessment & Plan Note (Signed)
New.  Pt only having pain intermittently.  Refill provided on flexeril and pain meds.  Controlled substance agreement signed and UDS collected.

## 2013-06-25 NOTE — Progress Notes (Signed)
  Subjective:    Patient ID: Lynn Campos, female    DOB: 1971/11/25, 42 y.o.   MRN: [id number]  HPI Neck pain- was treated by neurosurg.  Is now out of percocet and flexeril.  Tends to flare w/ stress, travel, increased typing.  S/p 2 level discectomy w/ plate in neck.  Has 'knot' in neck on R side that prevents her from turning R.     Review of Systems For ROS see HPI     Objective:   Physical Exam  Vitals reviewed. Constitutional: She appears well-developed and well-nourished. No distress.  HENT:  Head: Normocephalic and atraumatic.  Cardiovascular: Intact distal pulses.   Musculoskeletal:  Limited ROM in cervical spine- decreased motion to R Some discomfort w/ flexion/extension  Neurological: She has normal reflexes. Coordination normal.          Assessment & Plan:

## 2013-06-28 ENCOUNTER — Ambulatory Visit: Payer: BC Managed Care – PPO | Admitting: Pulmonary Disease

## 2013-07-05 ENCOUNTER — Ambulatory Visit (INDEPENDENT_AMBULATORY_CARE_PROVIDER_SITE_OTHER): Payer: BC Managed Care – PPO | Admitting: Pulmonary Disease

## 2013-07-05 ENCOUNTER — Encounter: Payer: Self-pay | Admitting: Pulmonary Disease

## 2013-07-05 VITALS — BP 112/70 | HR 73 | Temp 97.6°F | Ht 64.0 in | Wt 190.8 lb

## 2013-07-05 DIAGNOSIS — J45909 Unspecified asthma, uncomplicated: Secondary | ICD-10-CM

## 2013-07-05 DIAGNOSIS — J4520 Mild intermittent asthma, uncomplicated: Secondary | ICD-10-CM

## 2013-07-05 MED ORDER — BECLOMETHASONE DIPROPIONATE 80 MCG/ACT IN AERS: 2.0000 | INHALATION_SPRAY | Freq: Two times a day (BID) | RESPIRATORY_TRACT | Status: DC

## 2013-07-05 MED ORDER — ALBUTEROL SULFATE HFA 108 (90 BASE) MCG/ACT IN AERS: 2.0000 | INHALATION_SPRAY | RESPIRATORY_TRACT | Status: DC | PRN

## 2013-07-05 NOTE — Addendum Note (Signed)
Addended by: Maisie Fus on: 07/05/2013 12:52 PM   Modules accepted: Orders

## 2013-07-05 NOTE — Patient Instructions (Addendum)
Stay on qvar everyday for maintenance.  Will send prescription for this and your rescue inhaler to your pharmacy. followup with me in one year if doing well.

## 2013-07-05 NOTE — Progress Notes (Signed)
  Subjective:    Patient ID: Lynn Campos, female    DOB: 03/05/1971, 42 y.o.   MRN: [id number]  HPI The patient comes in today for followup of her known asthma.  She has done well on her current maintenance therapy, although she has been noncompliant on a few occasions with increased symptoms and therefore went back on her medication.  She has not required her rescue inhaler, and feels her breathing is excellent.   Review of Systems  Constitutional: Negative for fever and unexpected weight change.  HENT: Negative for ear pain, nosebleeds, congestion, sore throat, rhinorrhea, sneezing, trouble swallowing, dental problem, postnasal drip and sinus pressure.   Eyes: Negative for redness and itching.  Respiratory: Negative for cough, chest tightness, shortness of breath and wheezing.   Cardiovascular: Negative for palpitations and leg swelling.  Gastrointestinal: Negative for nausea and vomiting.  Genitourinary: Negative for dysuria.  Musculoskeletal: Negative for joint swelling.  Skin: Negative for rash.  Neurological: Negative for headaches.  Hematological: Does not bruise/bleed easily.  Psychiatric/Behavioral: Negative for dysphoric mood. The patient is not nervous/anxious.        Objective:   Physical Exam Obese female in no acute distress Nose without purulence or discharge noted Neck without lymphadenopathy or thyromegaly Chest clear to auscultation Cardiac exam is regular rate and rhythm Lower extremities without edema, no cyanosis Alert and oriented, moves all 4 extremities.       Assessment & Plan:

## 2013-07-05 NOTE — Assessment & Plan Note (Signed)
The patient is very well as long as she stays on inhaled corticosteroids.  I have stressed to her again the inflammatory component of asthma, and that it will always be there and ready to flare up if she does not suppress it with her inhaled steroids.  I will see her back in one year if doing well.

## 2013-07-16 ENCOUNTER — Telehealth: Payer: Self-pay | Admitting: Family Medicine

## 2013-07-16 NOTE — Telephone Encounter (Signed)
Patient Information:  Caller Name: Aishah  Phone: [phone]  Patient: Lynn Campos, Lynn Campos  Gender: Female  DOB: [birth year]  Age: 42 Years  PCP: Sheliah Hatch  Pregnant: No  Office Follow Up:  Does the office need to follow up with this patient?: No  Instructions For The Office: N/A  RN Note:  Patient states she is worried she may have a stomach ulcer.  States she notes new onset severe upper abdominal pain 4 days ago, worse after eating.  States the pain is sharp and keeps her from sleeping.  Has appt with Dr. Beverely Low 07/22/13.  Per upper abdominal pain protocol, emergent symptoms denied; advised appt within 2 weeks.  Will discuss at visit 07/22/13.  Care measures advised, with callback parameters given.  krs/can  Symptoms  Reason For Call & Symptoms: stomach pain, awakens in the middle of the night  Reviewed Health History In EMR: Yes  Reviewed Medications In EMR: Yes  Reviewed Allergies In EMR: Yes  Reviewed Surgeries / Procedures: Yes  Date of Onset of Symptoms: 07/12/2013 OB / GYN:  LMP: 07/02/2013  Guideline(s) Used:  Abdominal Pain - Upper  Disposition Per Guideline:   See Within 2 Weeks in Office  Reason For Disposition Reached:   Intermittent burning pains radiating into chest or sour taste in mouth  Advice Given:  Reassurance:  A mild stomachache can be from indigestion, stomach irritation, or overeating. Sometimes a stomachache signals the onset of a vomiting illness from a viral infection.  Here is some care advice that should help.  Fluids:   Sip clear fluids only (e.g., water, flat soft drinks, or half-strength fruit juice) until the pain is gone for 2 hours. Then slowly return to a regular diet.  Diet:  Slowly advance diet from clear liquids to a bland diet.  Avoid alcohol or caffeinated beverages.  Avoid greasy or fatty foods.  Antacid:  If having pain now, try taking an antacid (e.g., Mylanta, Maalox). Dose: 2 tablespoons (30 ml) of liquid by mouth.  Avoid  NSAIDS and Aspirin  : Avoid any drug that can irritate the stomach lining and make the pain worse (especially aspirin and NSAIDs like ibuprofen).  Stop Smoking:  Smoking can aggravate heartburn and stomach problems.  Reducing Reflux Symptoms (GERD):  Eat smaller meals and avoid snacks for 2 hours before sleeping. Avoid the following foods, which tend to aggravate heartburn and stomach problems: fatty/greasy foods, spicy foods, caffeinated beverages, mints, and chocolate.  Expected Course:  With harmless causes, the pain usually lessens or is resolved in 2 hours. With gastroenteritis, stomach cramps may precede each bout of vomiting or diarrhea. With serious causes (such as appendicitis), the pain becomes constant and severe.  Call Back If:  Abdominal pain is constant and present for more than 2 hours.  You become worse.  Patient Will Follow Care Advice:  YES

## 2013-07-16 NOTE — Telephone Encounter (Signed)
Pt should start OTC Omeprazole or Nexium twice daily, avoid spicy/acidic foods.  If sxs worsen should be seen sooner than 8/28- Saturday clinic is an option

## 2013-07-16 NOTE — Telephone Encounter (Signed)
Spoke with patient and advised of Dr. Rennis Golden recommendations. Patient states that she feels like she may have taken too many ibuprofen's. Was out of pain meds for a while and she took them as a substitute. Patient will keep appt unless something changes. Advised to seek medical attention if symptoms worsen or something changes. Patient agrees with plan.

## 2013-07-20 ENCOUNTER — Encounter: Payer: Self-pay | Admitting: Family Medicine

## 2013-07-22 ENCOUNTER — Encounter: Payer: BC Managed Care – PPO | Admitting: Family Medicine

## 2013-09-08 ENCOUNTER — Ambulatory Visit (INDEPENDENT_AMBULATORY_CARE_PROVIDER_SITE_OTHER): Payer: BC Managed Care – PPO | Admitting: Family Medicine

## 2013-09-08 ENCOUNTER — Encounter: Payer: Self-pay | Admitting: Family Medicine

## 2013-09-08 VITALS — BP 140/80 | HR 94 | Temp 98.5°F | Resp 16 | Wt 191.0 lb

## 2013-09-08 DIAGNOSIS — R109 Unspecified abdominal pain: Secondary | ICD-10-CM

## 2013-09-08 DIAGNOSIS — R1013 Epigastric pain: Secondary | ICD-10-CM | POA: Insufficient documentation

## 2013-09-08 LAB — CBC WITH DIFFERENTIAL/PLATELET
Basophils Absolute: 0 10*3/uL (ref 0.0–0.1)
Basophils Relative: 0.2 % (ref 0.0–3.0)
Eosinophils Absolute: 0.1 10*3/uL (ref 0.0–0.7)
Eosinophils Relative: 1.3 % (ref 0.0–5.0)
HCT: 40.4 % (ref 36.0–46.0)
Hemoglobin: 13.7 g/dL (ref 12.0–15.0)
Lymphocytes Relative: 9.7 % — ABNORMAL LOW (ref 12.0–46.0)
Lymphs Abs: 1 10*3/uL (ref 0.7–4.0)
MCHC: 33.8 g/dL (ref 30.0–36.0)
MCV: 88.1 fl (ref 78.0–100.0)
Monocytes Absolute: 0.7 10*3/uL (ref 0.1–1.0)
Monocytes Relative: 6.8 % (ref 3.0–12.0)
Neutro Abs: 8.9 10*3/uL — ABNORMAL HIGH (ref 1.4–7.7)
Neutrophils Relative %: 82 % — ABNORMAL HIGH (ref 43.0–77.0)
Platelets: 260 10*3/uL (ref 150.0–400.0)
RBC: 4.58 Mil/uL (ref 3.87–5.11)
RDW: 13.1 % (ref 11.5–14.6)
WBC: 10.8 10*3/uL — ABNORMAL HIGH (ref 4.5–10.5)

## 2013-09-08 MED ORDER — ONDANSETRON 8 MG PO TBDP: 8.0000 mg | ORAL_TABLET | Freq: Three times a day (TID) | ORAL | Status: DC | PRN

## 2013-09-08 MED ORDER — DICYCLOMINE HCL 20 MG PO TABS: 20.0000 mg | ORAL_TABLET | Freq: Four times a day (QID) | ORAL | Status: DC

## 2013-09-08 MED ORDER — CIPROFLOXACIN HCL 500 MG PO TABS: 500.0000 mg | ORAL_TABLET | Freq: Two times a day (BID) | ORAL | Status: DC

## 2013-09-08 MED ORDER — METRONIDAZOLE 500 MG PO TABS: 500.0000 mg | ORAL_TABLET | Freq: Three times a day (TID) | ORAL | Status: DC

## 2013-09-08 NOTE — Addendum Note (Signed)
Addended by: Sheliah Hatch on: 09/08/2013 02:17 PM   Modules accepted: Orders

## 2013-09-08 NOTE — Assessment & Plan Note (Signed)
New.  Mother w/ similar sxs recently in setting of viral illness.  Pt is much more tender than mother was.  Suspect diverticulitis but will get CBC to assess for elevated WBC.  Low threshold to start abx.  Bentyl for cramping/spasm.  zofran as needed for nausea.

## 2013-09-08 NOTE — Progress Notes (Signed)
  Subjective:    Patient ID: Lynn Campos, female    DOB: 1971-11-14, 42 y.o.   MRN: [id number]  HPI abd pain- pain is centered around umbilicus, 'hurts really bad'.  sxs started last night.  Pain will come in waves- 'stabbing and cramping'.  Vomited x1 but no nausea.  'explosive'.  Several episodes of watery stools.  No new or different foods.  No recent camping, traveling.  + sick contacts.  No fevers.  + distension, gas pain.  Pt has hx of appendectomy and cholecystectomy.     Review of Systems For ROS see HPI     Objective:   Physical Exam  Vitals reviewed. Constitutional: She appears well-developed and well-nourished.  uncomfortable  Cardiovascular: Normal rate, regular rhythm, normal heart sounds and intact distal pulses.   Pulmonary/Chest: Effort normal and breath sounds normal. No respiratory distress. She has no wheezes. She has no rales.  Abdominal: Soft. Bowel sounds are normal. She exhibits distension. There is tenderness (epigastric and LLQ tenderness). There is no rebound and no guarding.  Skin: Skin is dry.          Assessment & Plan:

## 2013-09-08 NOTE — Patient Instructions (Signed)
We'll notify you of your lab results and make any changes if needed (if the white count is high, we'll do Cipro and Flagyl for presumed diverticulitis) Clear liquid diet until pain improves Use the Bentyl every 6 hrs as needed for pain/spasm/cramping Use the Zofran as needed for nausea Call with any questions or concerns Hang in there!!

## 2013-09-15 ENCOUNTER — Telehealth: Payer: Self-pay

## 2013-09-15 NOTE — Telephone Encounter (Signed)
Left message for call back.

## 2013-09-16 NOTE — Telephone Encounter (Signed)
Medication and allergies: reviewed and updated  90 day supply/mail order: na Local pharmacy: [facility] [address]   Immunizations due:  Admin flu vaccine if patient well  A/P:   No changes to FH, SH MMG--- past due  Pap due---last 08/2010  To Discuss with Provider: Still not feeling well Excessive stools--diarrhea Would like to have ANA test again  Patient thought she had a Tdap at Urgent Care--she only had flu and PPD

## 2013-09-20 ENCOUNTER — Other Ambulatory Visit (HOSPITAL_COMMUNITY)
Admission: RE | Admit: 2013-09-20 | Discharge: 2013-09-20 | Disposition: A | Discharge status: Home / Self Care | Payer: BC Managed Care – PPO | Source: Ambulatory Visit | Attending: Family Medicine | Admitting: Family Medicine

## 2013-09-20 ENCOUNTER — Encounter: Payer: Self-pay | Admitting: Family Medicine

## 2013-09-20 ENCOUNTER — Ambulatory Visit (INDEPENDENT_AMBULATORY_CARE_PROVIDER_SITE_OTHER): Payer: BC Managed Care – PPO | Admitting: Family Medicine

## 2013-09-20 VITALS — BP 132/84 | HR 88 | Temp 97.9°F | Resp 16 | Ht 65.0 in | Wt 191.5 lb

## 2013-09-20 DIAGNOSIS — Z23 Encounter for immunization: Secondary | ICD-10-CM

## 2013-09-20 DIAGNOSIS — Z01419 Encounter for gynecological examination (general) (routine) without abnormal findings: Secondary | ICD-10-CM | POA: Insufficient documentation

## 2013-09-20 DIAGNOSIS — M329 Systemic lupus erythematosus, unspecified: Secondary | ICD-10-CM

## 2013-09-20 DIAGNOSIS — Z1151 Encounter for screening for human papillomavirus (HPV): Secondary | ICD-10-CM | POA: Insufficient documentation

## 2013-09-20 DIAGNOSIS — Z0000 Encounter for general adult medical examination without abnormal findings: Secondary | ICD-10-CM

## 2013-09-20 DIAGNOSIS — Z124 Encounter for screening for malignant neoplasm of cervix: Secondary | ICD-10-CM

## 2013-09-20 LAB — LIPID PANEL
HDL: 53.6 mg/dL (ref >39.00)
LDL Cholesterol: 88 mg/dL (ref 0–99)
Total CHOL/HDL Ratio: 3

## 2013-09-20 LAB — HEPATIC FUNCTION PANEL
AST: 25 U/L (ref 0–37)
Albumin: 3.9 g/dL (ref 3.5–5.2)
Alkaline Phosphatase: 44 U/L (ref 39–117)
Bilirubin, Direct: 0 mg/dL (ref 0.0–0.3)
Total Protein: 7 g/dL (ref 6.0–8.3)

## 2013-09-20 LAB — CBC WITH DIFFERENTIAL/PLATELET
Eosinophils Relative: 2.6 % (ref 0.0–5.0)
Lymphs Abs: 1.1 10*3/uL (ref 0.7–4.0)
MCV: 88.7 fl (ref 78.0–100.0)
Monocytes Absolute: 0.4 10*3/uL (ref 0.1–1.0)
Monocytes Relative: 6.2 % (ref 3.0–12.0)
Neutro Abs: 4.8 10*3/uL (ref 1.4–7.7)
Neutrophils Relative %: 74.1 % (ref 43.0–77.0)
Platelets: 220 10*3/uL (ref 150.0–400.0)
RBC: 4.62 Mil/uL (ref 3.87–5.11)
RDW: 13.2 % (ref 11.5–14.6)
WBC: 6.5 10*3/uL (ref 4.5–10.5)

## 2013-09-20 LAB — BASIC METABOLIC PANEL
CO2: 24 mEq/L (ref 19–32)
GFR: 117.37 mL/min (ref >60.00)
Glucose, Bld: 86 mg/dL (ref 70–99)
Potassium: 3.7 mEq/L (ref 3.5–5.1)
Sodium: 139 mEq/L (ref 135–145)

## 2013-09-20 MED ORDER — PREDNISONE 10 MG PO TABS: ORAL_TABLET | ORAL | Status: DC

## 2013-09-20 NOTE — Progress Notes (Signed)
  Subjective:    Patient ID: Lynn Campos, female    DOB: 08-01-1971, 42 y.o.   MRN: [id number]  HPI CPE- due for mammo and pap.  Too young for colonoscopy.  SLE- was seeing Dr Elliot Gault at [facility] and was told to f/u as needed.  Now having worsening fatigue and diffuse body aches.  Also developed skin lesions- currently w/ one on L index finger   Review of Systems Patient reports no vision/ hearing changes, adenopathy,fever, weight change,  persistant/recurrent hoarseness , swallowing issues, chest pain, palpitations, edema, persistant/recurrent cough, hemoptysis, dyspnea (rest/exertional/paroxysmal nocturnal), gastrointestinal bleeding (melena, rectal bleeding), abdominal pain, significant heartburn, bowel changes, GU symptoms (dysuria, hematuria, incontinence), Gyn symptoms (abnormal  bleeding, pain),  syncope, focal weakness, memory loss, numbness & tingling, hair/nail changes, abnormal bruising or bleeding, anxiety, or depression.     Objective:   Physical Exam  General Appearance:    Alert, cooperative, no distress, appears stated age  Head:    Normocephalic, without obvious abnormality, atraumatic  Eyes:    PERRL, conjunctiva/corneas clear, EOM's intact, fundi    benign, both eyes  Ears:    Normal TM's and external ear canals, both ears  Nose:   Nares normal, septum midline, mucosa normal, no drainage    or sinus tenderness  Throat:   Lips, mucosa, and tongue normal; teeth and gums normal  Neck:   Supple, symmetrical, trachea midline, no adenopathy;    Thyroid: no enlargement/tenderness/nodules  Back:     Symmetric, no curvature, ROM normal, no CVA tenderness  Lungs:     Clear to auscultation bilaterally, respirations unlabored  Chest Wall:    No tenderness or deformity   Heart:    Regular rate and rhythm, S1 and S2 normal, no murmur, rub   or gallop  Breast Exam:    No tenderness, masses, or nipple abnormality  Abdomen:     Soft, non-tender, bowel sounds active all four  quadrants,    no masses, no organomegaly  Genitalia:    External genitalia normal, cervix normal in appearance, no CMT, uterus in normal size and position, adnexa w/out mass or tenderness, mucosa pink and moist, no lesions or discharge present  Rectal:    Normal external appearance  Extremities:   Extremities normal, atraumatic, no cyanosis or edema  Pulses:   2+ and symmetric all extremities  Skin:   Skin color, texture, turgor normal, 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

## 2013-09-20 NOTE — Patient Instructions (Signed)
Follow up in 1 year or as needed Keep up the good work!  You look great! Add a probiotic daily Call [phone] to schedule your mammo We'll notify you of your lab results and make any changes if needed Someone will call you with your Rheumatology appt Hang in there!! Happy Halloween!!

## 2013-09-21 NOTE — Assessment & Plan Note (Signed)
Pap collected. 

## 2013-09-21 NOTE — Assessment & Plan Note (Signed)
Deteriorated.  Pt now having increased pain, fatigue, and has developed skin lesions.  Pt unhappy w/ [facility] experience- will refer locally.  Start steroid taper while waiting on referral.  Pt expressed understanding and is in agreement w/ plan.

## 2013-09-21 NOTE — Assessment & Plan Note (Signed)
Pt's PE WNL w/ exception of discoid lesion on finger.  Check labs.  Anticipatory guidance provided.

## 2013-09-22 ENCOUNTER — Ambulatory Visit: Payer: BC Managed Care – PPO

## 2013-09-22 ENCOUNTER — Encounter: Payer: Self-pay | Admitting: Family Medicine

## 2013-09-22 DIAGNOSIS — H539 Unspecified visual disturbance: Secondary | ICD-10-CM

## 2013-09-22 LAB — HEMOGLOBIN A1C: Hgb A1c MFr Bld: 5.7 % (ref 4.6–6.5)

## 2013-09-23 ENCOUNTER — Other Ambulatory Visit: Payer: Self-pay

## 2013-09-23 DIAGNOSIS — Z1231 Encounter for screening mammogram for malignant neoplasm of breast: Secondary | ICD-10-CM

## 2013-09-24 LAB — VITAMIN D 1,25 DIHYDROXY
Vitamin D 1, 25 (OH)2 Total: 41 pg/mL (ref 18–72)
Vitamin D3 1, 25 (OH)2: 41 pg/mL

## 2013-09-25 ENCOUNTER — Other Ambulatory Visit: Payer: Self-pay | Admitting: Family Medicine

## 2013-09-27 NOTE — Telephone Encounter (Signed)
Last OV 09-20-13 Med filled 06-25-13 330 with 1 refill  Low Risk

## 2013-11-04 ENCOUNTER — Ambulatory Visit
Admission: RE | Admit: 2013-11-04 | Discharge: 2013-11-04 | Disposition: A | Discharge status: Home / Self Care | Payer: BC Managed Care – PPO | Source: Ambulatory Visit

## 2013-11-04 DIAGNOSIS — Z1231 Encounter for screening mammogram for malignant neoplasm of breast: Secondary | ICD-10-CM

## 2013-12-13 ENCOUNTER — Other Ambulatory Visit: Payer: Self-pay | Admitting: Family Medicine

## 2013-12-14 NOTE — Telephone Encounter (Signed)
Med filled.  

## 2014-01-12 ENCOUNTER — Encounter: Payer: Self-pay | Admitting: Nurse Practitioner

## 2014-01-12 ENCOUNTER — Ambulatory Visit (INDEPENDENT_AMBULATORY_CARE_PROVIDER_SITE_OTHER): Payer: BC Managed Care – PPO | Admitting: Nurse Practitioner

## 2014-01-12 VITALS — BP 125/88 | HR 102 | Temp 98.7°F | Ht 65.0 in | Wt 190.8 lb

## 2014-01-12 DIAGNOSIS — J019 Acute sinusitis, unspecified: Secondary | ICD-10-CM

## 2014-01-12 MED ORDER — AMOXICILLIN-POT CLAVULANATE 875-125 MG PO TABS: 1.0000 | ORAL_TABLET | Freq: Two times a day (BID) | ORAL | Status: DC

## 2014-01-12 NOTE — Progress Notes (Signed)
Pre-visit discussion using our clinic review tool. No additional management support is needed unless otherwise documented below in the visit note.  

## 2014-01-12 NOTE — Progress Notes (Signed)
   Subjective:    Patient ID: Lynn HootsJami Campos, female    DOB: [birth year], 43 y.o.   MRN: [id number]  HPI Comments: Pt states she was taking prednisone, prescribed by rheum, but stopped due to increased appetite. She felt much better while taking it-less muscle & joint pain. Rheum ordered additional labs for pt for w/u of SLE. Pt has not had labs done as she does not think she has Lupus. I encouraged her to complete work up.   Sinusitis This is a new problem. The current episode started 1 to 4 weeks ago (2w). The problem has been gradually worsening since onset. There has been no fever. The pain is mild. Associated symptoms include congestion, headaches, a hoarse voice and sinus pressure. Pertinent negatives include no chills, coughing, shortness of breath or sore throat. Treatments tried: sinus rinses. The treatment provided mild relief.      Review of Systems  Constitutional: Positive for fatigue. Negative for fever and chills.  HENT: Positive for congestion, hoarse voice and sinus pressure. Negative for sore throat.   Eyes: Positive for discharge (R eye) and redness.  Respiratory: Negative for cough, chest tightness, shortness of breath and wheezing.   Cardiovascular: Negative for chest pain.  Gastrointestinal: Negative for abdominal pain.  Musculoskeletal: Negative for back pain.  Neurological: Positive for headaches.       Objective:   Physical Exam  Vitals reviewed. Constitutional: She is oriented to person, place, and time. She appears well-developed and well-nourished. No distress.  HENT:  Head: Normocephalic and atraumatic.  Right Ear: External ear normal.  Left Ear: External ear normal.  Mouth/Throat: Oropharynx is clear and moist. No oropharyngeal exudate.  Nasal pharynx red, purluent nasal drainage  Eyes: Right eye exhibits no discharge. Left eye exhibits no discharge.  Conjunctiva mildly red R eye.  Neck: Normal range of motion. Neck supple. No thyromegaly present.    Cardiovascular: Normal rate, regular rhythm and normal heart sounds.   No murmur heard. Pulmonary/Chest: Effort normal and breath sounds normal. No respiratory distress. She has no wheezes. She has no rales.  Lymphadenopathy:    She has no cervical adenopathy.  Neurological: She is alert and oriented to person, place, and time.  Skin: Skin is warm and dry.  Telangiectasias across cheeks.  Psychiatric: She has a normal mood and affect. Her behavior is normal. Thought content normal.          Assessment & Plan:  1. Acute sinus infection 2 week duration - amoxicillin-clavulanate (AUGMENTIN) 875-125 MG per tablet; Take 1 tablet by mouth 2 (two) times daily.  Dispense: 10 tablet; Refill: 0

## 2014-01-12 NOTE — Patient Instructions (Addendum)
Start antibiotic. Eat yogurt daily at lunch or afternoon to help prevent diarrhea that can be caused by antibiotic. Start daily sinus rinses (Neilmed Sinus rinse) for at least 5-7 days. You may use pseudoephedrine 30 mg twice daily for 4-6 days. Please call for re-evaluation if you are not improving. I encourage you to complete labs that Dr Kellie Simmering ordered. You might consider visiting [employer] [facility] website-read patient education regarding SLE or systemic lupus erythematosus.    Sinusitis Sinusitis is redness, soreness, and swelling (inflammation) of the paranasal sinuses. Paranasal sinuses are air pockets within the bones of your face (beneath the eyes, the middle of the forehead, or above the eyes). In healthy paranasal sinuses, mucus is able to drain out, and air is able to circulate through them by way of your nose. However, when your paranasal sinuses are inflamed, mucus and air can become trapped. This can allow bacteria and other germs to grow and cause infection. Sinusitis can develop quickly and last only a short time (acute) or continue over a long period (chronic). Sinusitis that lasts for more than 12 weeks is considered chronic.  CAUSES  Causes of sinusitis include:  Allergies.  Structural abnormalities, such as displacement of the cartilage that separates your nostrils (deviated septum), which can decrease the air flow through your nose and sinuses and affect sinus drainage.  Functional abnormalities, such as when the small hairs (cilia) that line your sinuses and help remove mucus do not work properly or are not present. SYMPTOMS  Symptoms of acute and chronic sinusitis are the same. The primary symptoms are pain and pressure around the affected sinuses. Other symptoms include:  Upper toothache.  Earache.  Headache.  Bad breath.  Decreased sense of smell and taste.  A cough, which worsens when you are lying flat.  Fatigue.  Fever.  Thick drainage  from your nose, which often is green and may contain pus (purulent).  Swelling and warmth over the affected sinuses. DIAGNOSIS  Your caregiver will perform a physical exam. During the exam, your caregiver may:  Look in your nose for signs of abnormal growths in your nostrils (nasal polyps).  Tap over the affected sinus to check for signs of infection.  View the inside of your sinuses (endoscopy) with a special imaging device with a light attached (endoscope), which is inserted into your sinuses. If your caregiver suspects that you have chronic sinusitis, one or more of the following tests may be recommended:  Allergy tests.  Nasal culture A sample of mucus is taken from your nose and sent to a lab and screened for bacteria.  Nasal cytology A sample of mucus is taken from your nose and examined by your caregiver to determine if your sinusitis is related to an allergy. TREATMENT  Most cases of acute sinusitis are related to a viral infection and will resolve on their own within 10 days. Sometimes medicines are prescribed to help relieve symptoms (pain medicine, decongestants, nasal steroid sprays, or saline sprays).  However, for sinusitis related to a bacterial infection, your caregiver will prescribe antibiotic medicines. These are medicines that will help kill the bacteria causing the infection.  Rarely, sinusitis is caused by a fungal infection. In theses cases, your caregiver will prescribe antifungal medicine. For some cases of chronic sinusitis, surgery is needed. Generally, these are cases in which sinusitis recurs more than 3 times per year, despite other treatments. HOME CARE INSTRUCTIONS   Drink plenty of water. Water helps thin the mucus so your  sinuses can drain more easily.  Use a humidifier.  Inhale steam 3 to 4 times a day (for example, sit in the bathroom with the shower running).  Apply a warm, moist washcloth to your face 3 to 4 times a day, or as directed by your  caregiver.  Use saline nasal sprays to help moisten and clean your sinuses.  Take over-the-counter or prescription medicines for pain, discomfort, or fever only as directed by your caregiver. SEEK IMMEDIATE MEDICAL CARE IF:  You have increasing pain or severe headaches.  You have nausea, vomiting, or drowsiness.  You have swelling around your face.  You have vision problems.  You have a stiff neck.  You have difficulty breathing. MAKE SURE YOU:   Understand these instructions.  Will watch your condition.  Will get help right away if you are not doing well or get worse. Document Released: 11/11/2005 Document Revised: 02/03/2012 Document Reviewed: 11/26/2011 [facility] Patient Information 2014 [place]Care, [place].

## 2014-02-03 ENCOUNTER — Other Ambulatory Visit: Payer: Self-pay | Admitting: Family Medicine

## 2014-02-04 NOTE — Telephone Encounter (Signed)
Med filled.  

## 2014-03-31 ENCOUNTER — Ambulatory Visit (INDEPENDENT_AMBULATORY_CARE_PROVIDER_SITE_OTHER): Payer: BC Managed Care – PPO | Admitting: Family Medicine

## 2014-03-31 ENCOUNTER — Encounter: Payer: Self-pay | Admitting: Family Medicine

## 2014-03-31 VITALS — BP 130/78 | HR 91 | Temp 98.2°F | Resp 16 | Wt 189.5 lb

## 2014-03-31 DIAGNOSIS — Z9889 Other specified postprocedural states: Secondary | ICD-10-CM

## 2014-03-31 DIAGNOSIS — F411 Generalized anxiety disorder: Secondary | ICD-10-CM

## 2014-03-31 MED ORDER — OXYCODONE-ACETAMINOPHEN 5-325 MG PO TABS: 1.0000 | ORAL_TABLET | ORAL | Status: DC | PRN

## 2014-03-31 MED ORDER — ALPRAZOLAM 0.25 MG PO TABS
0.2500 mg | ORAL_TABLET | Freq: Two times a day (BID) | ORAL | Status: DC | PRN
Start: 2014-03-31 — End: 2014-12-22

## 2014-03-31 NOTE — Assessment & Plan Note (Signed)
Pain meds refilled 
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
## 2014-03-31 NOTE — Patient Instructions (Signed)
Follow up in 4-6 weeks to recheck anxiety Use the Xanax as needed- start w/ 1 tab and take a 2nd if needed This will improve w/ time Call with any questions or concerns Happy Mother's Day!!

## 2014-03-31 NOTE — Progress Notes (Signed)
Pre visit review using our clinic review tool, if applicable. No additional management support is needed unless otherwise documented below in the visit note. 

## 2014-03-31 NOTE — Assessment & Plan Note (Addendum)
Deteriorated.  Pt's sxs are now specifically due to driving in car w/ family now that son has permit.  Has multiple car trips upcoming and doesn't think she'll be able to make it w/o some sort of medication to calm her.  Start low dose xanax prn.  Will continue to follow.

## 2014-05-24 ENCOUNTER — Ambulatory Visit (INDEPENDENT_AMBULATORY_CARE_PROVIDER_SITE_OTHER): Payer: BC Managed Care – PPO | Admitting: Nurse Practitioner

## 2014-05-24 ENCOUNTER — Encounter: Payer: Self-pay | Admitting: Nurse Practitioner

## 2014-05-24 VITALS — BP 137/83 | HR 90 | Temp 98.8°F | Resp 18 | Ht 64.0 in | Wt 191.0 lb

## 2014-05-24 DIAGNOSIS — R300 Dysuria: Secondary | ICD-10-CM

## 2014-05-24 DIAGNOSIS — N3000 Acute cystitis without hematuria: Secondary | ICD-10-CM

## 2014-05-24 DIAGNOSIS — N3001 Acute cystitis with hematuria: Secondary | ICD-10-CM

## 2014-05-24 LAB — POCT URINALYSIS DIPSTICK
Bilirubin, UA: NEGATIVE
Glucose, UA: NEGATIVE
Ketones, UA: NEGATIVE
Nitrite, UA: NEGATIVE
Protein, UA: 100
SPEC GRAV UA: 1.015
Urobilinogen, UA: 0.2
pH, UA: 6

## 2014-05-24 LAB — URINALYSIS, MICROSCOPIC ONLY

## 2014-05-24 MED ORDER — PHENAZOPYRIDINE HCL 200 MG PO TABS: 200.0000 mg | ORAL_TABLET | Freq: Three times a day (TID) | ORAL | Status: DC | PRN

## 2014-05-24 MED ORDER — SULFAMETHOXAZOLE-TMP DS 800-160 MG PO TABS: 1.0000 | ORAL_TABLET | Freq: Two times a day (BID) | ORAL | Status: DC

## 2014-05-24 NOTE — Patient Instructions (Signed)
Start antibiotic. Our office will call if we need to change the antibiotic. Take pyridium to relax bladder, caution: urine tears & sweat will be orange. Do not be alarmed! Sip hydrating fluids (water, juice, colorless soda, decaff tea) every hour to flush kidneys. Return in 1 month to recheck urine or sooner if symptoms do not improve or you feel worse. Please complete rheum work up.  Urinary Tract Infection Urinary tract infections (UTIs) can develop anywhere along your urinary tract. Your urinary tract is your body's drainage system for removing wastes and extra water. Your urinary tract includes two kidneys, two ureters, a bladder, and a urethra. Your kidneys are a pair of bean-shaped organs. Each kidney is about the size of your fist. They are located below your ribs, one on each side of your spine. CAUSES Infections are caused by microbes, which are microscopic organisms, including fungi, viruses, and bacteria. These organisms are so small that they can only be seen through a microscope. Bacteria are the microbes that most commonly cause UTIs. SYMPTOMS  Symptoms of UTIs may vary by age and gender of the patient and by the location of the infection. Symptoms in young women typically include a frequent and intense urge to urinate and a painful, burning feeling in the bladder or urethra during urination. Older women and men are more likely to be tired, shaky, and weak and have muscle aches and abdominal pain. A fever may mean the infection is in your kidneys. Other symptoms of a kidney infection include pain in your back or sides below the ribs, nausea, and vomiting. DIAGNOSIS To diagnose a UTI, your caregiver will ask you about your symptoms. Your caregiver also will ask to provide a urine sample. The urine sample will be tested for bacteria and white blood cells. White blood cells are made by your body to help fight infection. TREATMENT  Typically, UTIs can be treated with medication. Because most  UTIs are caused by a bacterial infection, they usually can be treated with the use of antibiotics. The choice of antibiotic and length of treatment depend on your symptoms and the type of bacteria causing your infection. HOME CARE INSTRUCTIONS  If you were prescribed antibiotics, take them exactly as your caregiver instructs you. Finish the medication even if you feel better after you have only taken some of the medication.  Drink enough water and fluids to keep your urine clear or pale yellow.  Avoid caffeine, tea, and carbonated beverages. They tend to irritate your bladder.  Empty your bladder often. Avoid holding urine for long periods of time.  Empty your bladder before and after sexual intercourse.  After a bowel movement, women should cleanse from front to back. Use each tissue only once. SEEK MEDICAL CARE IF:   You have back pain.  You develop a fever.  Your symptoms do not begin to resolve within 3 days. SEEK IMMEDIATE MEDICAL CARE IF:   You have severe back pain or lower abdominal pain.  You develop chills.  You have nausea or vomiting.  You have continued burning or discomfort with urination. MAKE SURE YOU:   Understand these instructions.  Will watch your condition.  Will get help right away if you are not doing well or get worse. Document Released: 08/21/2005 Document Revised: 05/12/2012 Document Reviewed: 12/20/2011 [facility] Patient Information 2014 [place]ExitCare, [place]LL[place].

## 2014-05-24 NOTE — Progress Notes (Signed)
Pre visit review using our clinic review tool, if applicable. No additional management support is needed unless otherwise documented below in the visit note. 

## 2014-05-24 NOTE — Progress Notes (Signed)
   Subjective:    Patient ID: Lynn HootsJami Campos, female    DOB: 10/21/1971, 43 y.o.   MRN: [id number]  Dysuria  This is a new problem. The current episode started in the past 7 days. The problem occurs intermittently. The problem has been gradually worsening. The quality of the pain is described as aching. The pain is mild. There has been no fever. She is sexually active. There is no history of pyelonephritis. Associated symptoms include flank pain, frequency and urgency. Pertinent negatives include no chills, discharge, hematuria, hesitancy, nausea, possible pregnancy or vomiting. Associated symptoms comments: Suprapubic pressure. She has tried increased fluids for the symptoms. The treatment provided no relief. SLE suspected, pt has not completed blood work ordered by rheum.      Review of Systems  Constitutional: Negative for fever, chills, activity change, appetite change and fatigue.  Cardiovascular: Palpitations: suprapubic.  Gastrointestinal: Positive for abdominal pain. Negative for nausea and vomiting.  Genitourinary: Positive for dysuria, urgency, frequency, flank pain, menstrual problem (has had MC irregularity for years.) and pelvic pain. Negative for hesitancy, hematuria, decreased urine volume, vaginal bleeding, vaginal discharge, difficulty urinating, genital sores and vaginal pain.       Objective:   Physical Exam  Vitals reviewed. Constitutional: She is oriented to person, place, and time. She appears well-developed and well-nourished. No distress.  HENT:  Head: Normocephalic and atraumatic.  Eyes: Conjunctivae are normal. Right eye exhibits no discharge. Left eye exhibits no discharge.  Cardiovascular: Normal rate.   Pulmonary/Chest: Effort normal. No respiratory distress.  Abdominal: Soft. She exhibits no distension and no mass. There is tenderness (mid abd tender bilat, spurapubic tenderness). There is no rebound and no guarding.  Musculoskeletal: She exhibits tenderness  (bilat CVA tenderness).  Neurological: She is alert and oriented to person, place, and time.  Skin: Skin is warm and dry.  Psychiatric: She has a normal mood and affect. Her behavior is normal. Thought content normal.          Assessment & Plan:  1. Dysuria - POCT urinalysis dipstick-pos blood, lg protein, leuks - Urine culture - Urine Microscopic Only  2. Acute cystitis with hematuria - sulfamethoxazole-trimethoprim (BACTRIM DS) 800-160 MG per tablet; Take 1 tablet by mouth 2 (two) times daily.  Dispense: 6 tablet; Refill: 0 - phenazopyridine (PYRIDIUM) 200 MG tablet; Take 1 tablet (200 mg total) by mouth 3 (three) times daily as needed for pain.  Dispense: 6 tablet; Refill: 0  Recommend f/u 1 mo. to check urine again due to proteinuria & hematuria,

## 2014-05-25 ENCOUNTER — Telehealth: Payer: Self-pay | Admitting: Family Medicine

## 2014-05-25 NOTE — Telephone Encounter (Signed)
Patient Information:  Caller Name: Clearnce HastenJami  Phone: [phone]  Patient: Lynn Campos, Lynn Campos  Gender: Female  DOB: 1971/08/25  Age: [age] Years  PCP: Sheliah Hatchabori, Katherine E.  Pregnant: No  Office Follow Up:  Does the office need to follow up with this patient?: No  Instructions For The Office: N/A  RN Note:  She will continue to take Bactrim unless advised otherwise.  Symptoms  Reason For Call & Symptoms: Irregular menses. Husband Vasectomy. Calling about Flank pain last night and woke up this morning feeling nauseous and vomited x1. Seen in the office on 05/24/14 and started on Bactrim for UTI- sx that started on 05/20/14- urinary frequency and lower abdominal pressure. Taken 4 doses of Bactrim so far and vomited before took dose this morning.  Zofran at 10:30 and helped with nauseousness and is using heating pad on back  And pain is better #3 on 1-10 scale. Afebrile. Voiding QS and urine orange d/t Pyridium.  Advised to call back if back sx worsening or if has any more vomiting. Took Keflex last time for UTI. Please let Dr. Beverely Lowabori know that patient having back pain.  Reviewed Health History In EMR: Yes  Reviewed Medications In EMR: Yes  Reviewed Allergies In EMR: Yes  Reviewed Surgeries / Procedures: Yes  Date of Onset of Symptoms: 05/20/2014  Treatments Tried: Heating pad, zofran  Treatments Tried Worked: No OB / GYN:  LMP: 12/12/2013  Guideline(s) Used:  Flank Pain  Disposition Per Guideline:   See Today in Office  Reason For Disposition Reached:   Moderate pain (e.g., interferes with normal activities or awakens from sleep)  Advice Given:  Cold or Heat:  Heat Pack: If pain lasts over 2 days, apply heat to the sore area. Use a heat pack, heating pad, or warm wet washcloth. Do this for 10 minutes, then as needed.  Pain Medicines:  For pain relief, you can take either acetaminophen, ibuprofen, or naproxen.  Call Back If:  Fever over 100.5 F (38.1 C)  Burning with urination or blood in  urine  Pain lasts over 3 days  You become worse.  Patient Refused Recommendation:  Patient Will Follow Up With Office Later  Please let Dr. Beverely Lowabori know that patient started with flank pain last night and vomited 1x this morning. Back is still hurting.

## 2014-05-25 NOTE — Telephone Encounter (Signed)
Pt called in with increasing back pain, feelings of nausea and vomiting.  Routed to [facility] person for triage.

## 2014-05-25 NOTE — Telephone Encounter (Signed)
FYI

## 2014-05-25 NOTE — Telephone Encounter (Signed)
Left message on voice mail that Dr. Beverely Lowabori feels this may be a kidney stone. Advised the patient to seek treatment in ER or UC if sx worsen.

## 2014-05-25 NOTE — Telephone Encounter (Signed)
Pt's sxs sound consistent w/ kidney stone.  Based on this, I would recommend UC or ER evaluation if symptoms worsen.

## 2014-05-27 LAB — URINE CULTURE

## 2014-05-30 ENCOUNTER — Telehealth: Payer: Self-pay | Admitting: Nurse Practitioner

## 2014-05-30 NOTE — Telephone Encounter (Signed)
pls call pt: Advise Urine culture confirms UTI. Ask if feeling better.  

## 2014-05-30 NOTE — Telephone Encounter (Signed)
Left detailed message on pt's cell vm. 

## 2014-06-16 ENCOUNTER — Ambulatory Visit: Payer: BC Managed Care – PPO | Admitting: Family Medicine

## 2014-06-17 ENCOUNTER — Ambulatory Visit: Payer: BC Managed Care – PPO | Admitting: Family Medicine

## 2014-06-23 ENCOUNTER — Encounter: Payer: Self-pay | Admitting: Family Medicine

## 2014-06-29 ENCOUNTER — Other Ambulatory Visit: Payer: Self-pay | Admitting: Family Medicine

## 2014-06-30 NOTE — Telephone Encounter (Signed)
Med filled.  

## 2014-09-08 ENCOUNTER — Ambulatory Visit (INDEPENDENT_AMBULATORY_CARE_PROVIDER_SITE_OTHER): Payer: BC Managed Care – PPO | Admitting: Nurse Practitioner

## 2014-09-08 ENCOUNTER — Other Ambulatory Visit: Payer: Self-pay | Admitting: *Deleted

## 2014-09-08 ENCOUNTER — Encounter: Payer: Self-pay | Admitting: Nurse Practitioner

## 2014-09-08 VITALS — BP 133/92 | HR 92 | Temp 98.3°F | Ht 64.0 in | Wt 188.0 lb

## 2014-09-08 DIAGNOSIS — L719 Rosacea, unspecified: Secondary | ICD-10-CM

## 2014-09-08 DIAGNOSIS — J0111 Acute recurrent frontal sinusitis: Secondary | ICD-10-CM

## 2014-09-08 MED ORDER — AMOXICILLIN-POT CLAVULANATE 875-125 MG PO TABS: 1.0000 | ORAL_TABLET | Freq: Two times a day (BID) | ORAL | Status: DC

## 2014-09-08 MED ORDER — BRIMONIDINE TARTRATE 0.33 % EX GEL: CUTANEOUS | Status: DC

## 2014-09-08 MED ORDER — CYCLOBENZAPRINE HCL 10 MG PO TABS: ORAL_TABLET | ORAL | Status: DC

## 2014-09-08 MED ORDER — OXYCODONE-ACETAMINOPHEN 5-325 MG PO TABS: 1.0000 | ORAL_TABLET | ORAL | Status: DC | PRN

## 2014-09-08 NOTE — Assessment & Plan Note (Signed)
Rheum labs neg for SLE Will treat with mirvasa.

## 2014-09-08 NOTE — Patient Instructions (Signed)
Start antibiotic. Start antibiotic. Eat yogurt daily to help prevent antibiotic -associated diarrhea.  Continue daily sinus rinses. Please let us know if you are still running fever in 3 days.  Apply topical mirvasa daily.   Rosacea Rosacea is a long-term (chronic) condition that affects the skin of the face (cheeks, nose, brow, and chin) and sometimes the eyes. Rosacea causes the blood vessels near the surface of the skin to enlarge, resulting in redness. This condition usually begins after age 45. It occurs most often in light-skinned women. Without treatment, rosacea tends to get worse over time. There is no cure for rosacea, but treatment can help control your symptoms. CAUSES  The cause is unknown. It is thought that some people may inherit a tendency to develop rosacea. Certain triggers can make your rosacea worse, including:  Hot baths.  Exercise.  Sunlight.  Very hot or cold temperatures.  Hot or spicy foods and drinks.  Drinking alcohol.  Stress.  Taking blood pressure medicine.  Long-term use of topical steroids on the face. SYMPTOMS   Redness of the face.  Red bumps or pimples on the face.  Red, enlarged nose (rhinophyma).  Blushing easily.  Red lines on the skin.  Irritated or burning feeling in the eyes.  Swollen eyelids. DIAGNOSIS  Your caregiver can usually tell what is wrong by asking about your symptoms and performing a physical exam. TREATMENT  Avoiding triggers is an important part of treatment. You will also need to see a skin specialist (dermatologist) who can develop a treatment plan for you. The goals of treatment are to control your condition and to improve the appearance of your skin. It may take several weeks or months of treatment before you notice an improvement in your skin. Even after your skin improves, you will likely need to continue treatment to prevent your rosacea from coming back. Treatment methods may include:  Using sunscreen or  sunblock daily to protect the skin.  Antibiotic medicine, such as metronidazole, applied directly to the skin.  Antibiotics taken by mouth. This is usually prescribed if you have eye problems from your rosacea.  Laser surgery to improve the appearance of the skin. This surgery can reduce the appearance of red lines on the skin and can remove excess tissue from the nose to reduce its size. HOME CARE INSTRUCTIONS  Avoid things that seem to trigger your flare-ups.  If you are given antibiotics, take them as directed. Finish them even if you start to feel better.  Use a gentle facial cleanser that does not contain alcohol.  You may use a mild facial moisturizer.  Use a sunscreen or sunblock with SPF 30 or greater.  Wear a green-tinted foundation powder to conceal redness, if needed. Choose cosmetics that are noncomedogenic. This means they do not block your pores.  If your eyelids are affected, apply warm compresses to the eyelids. Do this up to 4 times a day or as directed by your caregiver. SEEK MEDICAL CARE IF:  Your skin problems get worse.  You feel depressed.  You lose your appetite.  You have trouble concentrating.  You have problems with your eyes, such as redness or itching. MAKE SURE YOU:  Understand these instructions.  Will watch your condition.  Will get help right away if you are not doing well or get worse. Document Released: 12/19/2004 Document Revised: 05/12/2012 Document Reviewed: 10/22/2011 [facility]Exit[facility] Patient Information 2015 [place]ajasExitCare, [place]LL[place]. This information is not intended to replace advice given to you by your health  care provider. Make sure you discuss any questions you have with your health care provider.  

## 2014-09-08 NOTE — Progress Notes (Signed)
Subjective:     Lynn Campos is a 43 y.o. female c/o nasal congestion for 2 weeks. Associated symptoms are HA, fever (100.8), sinus pressure, occasional cough. She denies fatigue, chest pain, SOB, ear pain. She is taking ibuprophen for fever, last dose 2 hrs ago. Afebrile in office. She also asks about treatment for rosacea. Recent rheum (Dr Kellie Simmeringruslow) w/u neg for SLE (ENA panel neg).   The following portions of the patient's history were reviewed and updated as appropriate: allergies, current medications, past medical history, past social history, past surgical history and problem list.  Review of Systems Pertinent items are noted in HPI.    Objective:    BP 133/92  Pulse 92  Temp(Src) 98.3 F (36.8 C) (Oral)  Ht 5\' 4"  (1.626 m)  Wt 188 lb (85.276 kg)  BMI 32.25 kg/m2  SpO2 98% BP 133/92  Pulse 92  Temp(Src) 98.3 F (36.8 C) (Oral)  Ht 5\' 4"  (1.626 m)  Wt 188 lb (85.276 kg)  BMI 32.25 kg/m2  SpO2 98% General appearance: alert, cooperative, appears stated age and no distress Head: Normocephalic, without obvious abnormality, atraumatic Eyes: negative findings: lids and lashes normal and conjunctivae and sclerae normal Ears: normal TM's and external ear canals both ears Throat: lips, mucosa, and tongue normal; teeth and gums normal Nose: moist. Voice has nasal quality. Lungs: clear to auscultation bilaterally Heart: regular rate and rhythm, S1, S2 normal, no murmur, click, rub or gallop Skin: multiple telangiectasias across cheeks & nose.   Lymph nodes: Shoddy anterior & posterior cervical nodes, NT, moveable. Assessment:     1. Rosacea - Brimonidine Tartrate 0.33 % GEL; Apply to face daily.  Dispense: 30 g; Refill: 3  2. Acute recurrent frontal sinusitis - amoxicillin-clavulanate (AUGMENTIN) 875-125 MG per tablet; Take 1 tablet by mouth 2 (two) times daily.  Dispense: 10 tablet; Refill: 0 F/u PRN fever after 3 days.

## 2014-09-08 NOTE — Telephone Encounter (Signed)
Last OV 03/31/14 Flexeril filled 12-13-13 #30 with 0 Oxy last filled 03-31-14 #30 with 0

## 2014-09-08 NOTE — Progress Notes (Signed)
Pre visit review using our clinic review tool, if applicable. No additional management support is needed unless otherwise documented below in the visit note. 

## 2014-09-09 ENCOUNTER — Other Ambulatory Visit: Payer: Self-pay

## 2014-09-09 NOTE — Telephone Encounter (Signed)
Med filled and pt notified.  

## 2014-11-16 ENCOUNTER — Ambulatory Visit (INDEPENDENT_AMBULATORY_CARE_PROVIDER_SITE_OTHER): Payer: BC Managed Care – PPO | Admitting: Nurse Practitioner

## 2014-11-16 ENCOUNTER — Telehealth: Payer: Self-pay | Admitting: Nurse Practitioner

## 2014-11-16 ENCOUNTER — Encounter: Payer: Self-pay | Admitting: Nurse Practitioner

## 2014-11-16 VITALS — BP 130/77 | HR 67 | Temp 98.4°F | Ht 64.0 in | Wt 188.0 lb

## 2014-11-16 DIAGNOSIS — N921 Excessive and frequent menstruation with irregular cycle: Secondary | ICD-10-CM

## 2014-11-16 LAB — CBC WITH DIFFERENTIAL/PLATELET
BASOS ABS: 0 10*3/uL (ref 0.0–0.1)
BASOS PCT: 0.5 % (ref 0.0–3.0)
Eosinophils Absolute: 0.1 10*3/uL (ref 0.0–0.7)
Eosinophils Relative: 1.8 % (ref 0.0–5.0)
HEMATOCRIT: 37.3 % (ref 36.0–46.0)
Hemoglobin: 12.1 g/dL (ref 12.0–15.0)
LYMPHS ABS: 1.5 10*3/uL (ref 0.7–4.0)
Lymphocytes Relative: 21 % (ref 12.0–46.0)
MCHC: 32.6 g/dL (ref 30.0–36.0)
MCV: 89.9 fl (ref 78.0–100.0)
MONO ABS: 0.4 10*3/uL (ref 0.1–1.0)
Monocytes Relative: 5.7 % (ref 3.0–12.0)
NEUTROS ABS: 5.1 10*3/uL (ref 1.4–7.7)
Neutrophils Relative %: 71 % (ref 43.0–77.0)
Platelets: 228 10*3/uL (ref 150.0–400.0)
RBC: 4.15 Mil/uL (ref 3.87–5.11)
RDW: 13.9 % (ref 11.5–15.5)
WBC: 7.2 10*3/uL (ref 4.0–10.5)

## 2014-11-16 LAB — TSH: TSH: 1.28 u[IU]/mL (ref 0.35–4.50)

## 2014-11-16 LAB — COMPREHENSIVE METABOLIC PANEL WITH GFR
ALT: 13 U/L (ref 0–35)
AST: 14 U/L (ref 0–37)
Albumin: 3.8 g/dL (ref 3.5–5.2)
Alkaline Phosphatase: 48 U/L (ref 39–117)
BUN: 9 mg/dL (ref 6–23)
CO2: 26 meq/L (ref 19–32)
Calcium: 8.4 mg/dL (ref 8.4–10.5)
Chloride: 105 meq/L (ref 96–112)
Creatinine, Ser: 0.7 mg/dL (ref 0.4–1.2)
GFR: 104.42 mL/min
Glucose, Bld: 75 mg/dL (ref 70–99)
Potassium: 3.9 meq/L (ref 3.5–5.1)
Sodium: 136 meq/L (ref 135–145)
Total Bilirubin: 0.5 mg/dL (ref 0.2–1.2)
Total Protein: 6.4 g/dL (ref 6.0–8.3)

## 2014-11-16 LAB — T4, FREE: Free T4: 0.76 ng/dL (ref 0.60–1.60)

## 2014-11-16 NOTE — Telephone Encounter (Signed)
pls call pt: Advise No anemia thyroid is not causing menstrual irregularity Continue w/plan to see gynecology.

## 2014-11-16 NOTE — Progress Notes (Signed)
Pre visit review using our clinic review tool, if applicable. No additional management support is needed unless otherwise documented below in the visit note. 

## 2014-11-16 NOTE — Patient Instructions (Addendum)
Take 800 mg ibuprophen. With food. Repeat 1 time in 8 hours. OK to do this up to 5 days.   Please see gynecology for further evaluation.  Nice to see you!  Merry Christmas!

## 2014-11-17 NOTE — Telephone Encounter (Signed)
Patient notified of results.

## 2014-11-17 NOTE — Telephone Encounter (Signed)
LMOVM for pt to return call 

## 2014-11-19 NOTE — Progress Notes (Signed)
Subjective:     Lynn HootsJami Campos is a 43 y.o. woman who presents for irregular menses. Patient's last menstrual period was 10/19/2014.  Periods are irregular, lasting 5 days. This last cycle has been unusually long: 3 weeks. It waxes & wanes from light to heavy requiring her to change clothes during night on several occasions. She has some mild cramping-not bad enough to take pain meds. Sometimes pain seems localized in RLQ. BC; husband had vasectomy. History of abnormal Pap smear: no.  The following portions of the patient's history were reviewed and updated as appropriate: allergies, current medications, past medical history, past social history, past surgical history and problem list.  Review of Systems Constitutional: negative for fatigue, fevers and night sweats Respiratory: negative for dyspnea on exertion Cardiovascular: negative for near-syncope and palpitations Gastrointestinal: negative for change in bowel habits and diarrhea Genitourinary:negative for hot flashes Integument/breast: negative for breast tenderness Endocrine: negative for temperature intolerance     Objective:    BP 130/77 mmHg  Pulse 67  Temp(Src) 98.4 F (36.9 C) (Temporal)  Ht 5\' 4"  (1.626 m)  Wt 188 lb (85.276 kg)  BMI 32.25 kg/m2  SpO2 98%  LMP 10/19/2014 General appearance: alert, cooperative, appears stated age and no distress Head: Normocephalic, without obvious abnormality, atraumatic Eyes: negative findings: lids and lashes normal and conjunctivae and sclerae normal Neck: no adenopathy, no carotid bruit, supple, symmetrical, trachea midline and thyroid not enlarged, symmetric, no tenderness/mass/nodules Lungs: clear to auscultation bilaterally Heart: regular rate and rhythm, S1, S2 normal, no murmur, click, rub or gallop Abdomen: soft, non-tender; bowel sounds normal; no masses,  no organomegaly    Assessment:Plan   1. Menorrhagia with irregular cycle DD: perimenopausal, uterine fibroids, thyroid  disease - CBC with Differential - Comprehensive metabolic panel - TSH - T4, free - Ambulatory referral to Gynecology High dose ibuprophen for 3 days to try to get bleeding to decrease: 800 mg twice daily with food

## 2014-11-23 ENCOUNTER — Ambulatory Visit: Payer: BC Managed Care – PPO | Admitting: Family Medicine

## 2014-11-25 HISTORY — PX: SIGMOIDOSCOPY: SUR1295

## 2014-12-22 ENCOUNTER — Other Ambulatory Visit: Payer: Self-pay | Admitting: Family Medicine

## 2014-12-22 NOTE — Telephone Encounter (Signed)
Last OV 03-31-14 Alprazolam last filled 03-31-14 #60 with 1 Flexeril last filled 03-31-14 #30 with 0

## 2014-12-22 NOTE — Telephone Encounter (Signed)
Med filled and faxed.  

## 2015-03-07 ENCOUNTER — Other Ambulatory Visit: Payer: Self-pay | Admitting: Family Medicine

## 2015-03-08 NOTE — Telephone Encounter (Signed)
Med filled.  

## 2015-03-24 ENCOUNTER — Telehealth: Payer: Self-pay | Admitting: Family Medicine

## 2015-03-24 MED ORDER — OXYCODONE-ACETAMINOPHEN 5-325 MG PO TABS: 1.0000 | ORAL_TABLET | ORAL | Status: DC | PRN

## 2015-03-24 NOTE — Telephone Encounter (Signed)
Last OV 03-31-14  Oxycodone last filled 09-08-14 #30 with 0

## 2015-03-24 NOTE — Telephone Encounter (Signed)
Med filled and pt notified.  

## 2015-03-24 NOTE — Telephone Encounter (Signed)
Pt will be here on Monday to pick up.

## 2015-03-24 NOTE — Telephone Encounter (Signed)
Relation to pt: self  Call back number: [phone]   Reason for call:  Pt requesting a refill oxyCODONE-acetaminophen (PERCOCET/ROXICET) 5-325 MG per tablet. Pt scheduled appointment for 03/31/15 at 2:30 requesting RX before then.

## 2015-03-24 NOTE — Telephone Encounter (Signed)
Ok for #30 

## 2015-03-31 ENCOUNTER — Ambulatory Visit (INDEPENDENT_AMBULATORY_CARE_PROVIDER_SITE_OTHER): Payer: BC Managed Care – PPO | Admitting: Family Medicine

## 2015-03-31 ENCOUNTER — Encounter: Payer: Self-pay | Admitting: Family Medicine

## 2015-03-31 VITALS — BP 120/82 | HR 91 | Temp 98.0°F | Resp 16 | Wt 192.5 lb

## 2015-03-31 DIAGNOSIS — Z9889 Other specified postprocedural states: Secondary | ICD-10-CM | POA: Diagnosis not present

## 2015-03-31 DIAGNOSIS — F411 Generalized anxiety disorder: Secondary | ICD-10-CM | POA: Diagnosis not present

## 2015-03-31 DIAGNOSIS — J029 Acute pharyngitis, unspecified: Secondary | ICD-10-CM | POA: Diagnosis not present

## 2015-03-31 MED ORDER — ALPRAZOLAM 0.25 MG PO TABS: 0.2500 mg | ORAL_TABLET | Freq: Two times a day (BID) | ORAL | Status: DC | PRN

## 2015-03-31 NOTE — Patient Instructions (Signed)
Schedule your complete physical in 6 months Continue the Alprazolam as needed for anxiety Use the percocet as needed for severe neck pain Call with any questions or concerns Keep up the good work!!! Happy Mother's Day!!!
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
## 2015-03-31 NOTE — Progress Notes (Signed)
Pre visit review using our clinic review tool, if applicable. No additional management support is needed unless otherwise documented below in the visit note. 

## 2015-04-02 DIAGNOSIS — J029 Acute pharyngitis, unspecified: Secondary | ICD-10-CM | POA: Insufficient documentation

## 2015-04-02 NOTE — Assessment & Plan Note (Signed)
New.  No evidence of bacterial infection.  Reassurance provided.  Reviewed supportive care and red flags that should prompt return.  Pt expressed understanding and is in agreement w/ plan.
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
## 2015-04-02 NOTE — Assessment & Plan Note (Signed)
Continues to have chronic pain from this procedure.  Taking oxycodone prn.  Refill given earlier this week.  Pt only takes 1-2 pills/week for severe pain.  Will continue to follow.

## 2015-04-20 ENCOUNTER — Ambulatory Visit (INDEPENDENT_AMBULATORY_CARE_PROVIDER_SITE_OTHER): Payer: BC Managed Care – PPO | Admitting: Family Medicine

## 2015-04-20 ENCOUNTER — Telehealth: Payer: Self-pay | Admitting: Family Medicine

## 2015-04-20 ENCOUNTER — Encounter: Payer: Self-pay | Admitting: Family Medicine

## 2015-04-20 VITALS — BP 131/83 | HR 92 | Temp 98.6°F | Wt 193.2 lb

## 2015-04-20 DIAGNOSIS — J0100 Acute maxillary sinusitis, unspecified: Secondary | ICD-10-CM | POA: Diagnosis not present

## 2015-04-20 MED ORDER — EPINEPHRINE HCL 1 MG/ML IJ SOLN
1.0000 mg | Freq: Once | INTRAMUSCULAR | Status: DC
Start: 2015-04-20 — End: 2015-04-21

## 2015-04-20 MED ORDER — PROMETHAZINE-DM 6.25-15 MG/5ML PO SYRP: 5.0000 mL | ORAL_SOLUTION | Freq: Four times a day (QID) | ORAL | Status: DC | PRN

## 2015-04-20 MED ORDER — AMOXICILLIN 875 MG PO TABS
875.0000 mg | ORAL_TABLET | Freq: Two times a day (BID) | ORAL | Status: DC
Start: 2015-04-20 — End: 2015-10-18

## 2015-04-20 NOTE — Patient Instructions (Signed)
Follow up as needed Start the Amoxicillin twice daily- take w/ food Use the cough syrup as needed- will cause drowsiness Drink plenty of fluids REST! Mucinex DM for daytime cough Call with any questions or concerns Hang in there!!!

## 2015-04-20 NOTE — Progress Notes (Signed)
   Subjective:    Patient ID: Lynn Campos, female    DOB: Mar 22, 1971, 44 y.o.   MRN: [id number]  HPI URI- sxs started 3 weeks ago.  Now having sinus pain/pressure.  + cough- productive of brown sputum.  No fevers.  + tooth pain- upper jaw.  No ear pain.  + nausea, vomiting x1.  + sick contacts.  Hx of sinus infxns.   Review of Systems For ROS see HPI     Objective:   Physical Exam  Constitutional: She appears well-developed and well-nourished. No distress.  HENT:  Head: Normocephalic and atraumatic.  Right Ear: Tympanic membrane normal.  Left Ear: Tympanic membrane normal.  Nose: Mucosal edema and rhinorrhea present. Right sinus exhibits maxillary sinus tenderness and frontal sinus tenderness. Left sinus exhibits maxillary sinus tenderness and frontal sinus tenderness.  Mouth/Throat: Uvula is midline and mucous membranes are normal. Posterior oropharyngeal erythema present. No oropharyngeal exudate.  Eyes: Conjunctivae and EOM are normal. Pupils are equal, round, and reactive to light.  Neck: Normal range of motion. Neck supple.  Cardiovascular: Normal rate, regular rhythm and normal heart sounds.   Pulmonary/Chest: Effort normal and breath sounds normal. No respiratory distress. She has no wheezes.  Lymphadenopathy:    She has no cervical adenopathy.  Vitals reviewed.         Assessment & Plan:

## 2015-04-20 NOTE — Telephone Encounter (Signed)
Please advise if this is what you prefer or the regular epi-pen.     KP

## 2015-04-20 NOTE — Progress Notes (Signed)
Pre visit review using our clinic review tool, if applicable. No additional management support is needed unless otherwise documented below in the visit note. 

## 2015-04-20 NOTE — Telephone Encounter (Signed)
Call[facility]  Call back number: [phone]   Reason for call:   As per pharmacy EPINEPHrine (ADRENALIN) 1 MG/ML injection medication is not available in injection form comes in close container glass capsule form. in need of clarification.

## 2015-04-21 MED ORDER — EPINEPHRINE 0.3 MG/0.3ML IJ SOAJ: 0.3000 mg | Freq: Once | INTRAMUSCULAR | Status: AC

## 2015-04-21 NOTE — Assessment & Plan Note (Signed)
Pt's sxs and PE consistent w/ infxn.  Start abx.  Cough med prn.  Reviewed supportive care and red flags that should prompt return.  Pt expressed understanding and is in agreement w/ plan.

## 2015-04-21 NOTE — Telephone Encounter (Signed)
Med filled.  

## 2015-04-21 NOTE — Telephone Encounter (Signed)
It was supposed to be an epi-pen.  Please clarify w/ pharmacy

## 2015-05-10 ENCOUNTER — Other Ambulatory Visit: Payer: Self-pay | Admitting: Family Medicine

## 2015-05-10 NOTE — Telephone Encounter (Signed)
Med filled.  

## 2015-05-25 ENCOUNTER — Ambulatory Visit: Payer: BC Managed Care – PPO | Attending: Orthopedic Surgery | Admitting: Physical Therapy

## 2015-05-25 ENCOUNTER — Encounter: Payer: Self-pay | Admitting: Physical Therapy

## 2015-05-25 DIAGNOSIS — M25612 Stiffness of left shoulder, not elsewhere classified: Secondary | ICD-10-CM

## 2015-05-25 DIAGNOSIS — M25512 Pain in left shoulder: Secondary | ICD-10-CM | POA: Diagnosis not present

## 2015-05-25 NOTE — Therapy (Signed)
[facility] [address] [place], [place], [address] Phone: [phone]   Fax:  [phone]  Physical Therapy Evaluation  Patient Details  Name: Lynn HootsJami Campos MRN: [id number] Date of Birth: Jan 28, 1971 Referring Provider:  Beverely LowNorris, Steve, MD  Encounter Date: 05/25/2015      PT End of Session - 05/25/15 1011    Visit Number 1   Date for PT Re-Evaluation 07/25/15   PT Start Time 0921   PT Stop Time 1015   PT Time Calculation (min) 54 min   Activity Tolerance Patient limited by pain      Past Medical History  Diagnosis Date  . Mitral regurgitation   . Dyspnea   . GERD (gastroesophageal reflux disease)   . Lupus   . Palpitations   . Asthma   . Rosacea     Past Surgical History  Procedure Laterality Date  . Appendectomy    . Cholecystectomy  05/2010  . Cervical spine surgery  01/2010    There were no vitals filed for this visit.  Visit Diagnosis:  Left shoulder pain - Plan: PT plan of care cert/re-cert  Shoulder stiffness, left - Plan: PT plan of care cert/re-cert      Subjective Assessment - 05/25/15 0930    Subjective Patient undewent a left RC repair on 05/22/15.  She has been having pain in the left shoulder for 3 years, has been dealing with this with cortisone injections   Limitations Lifting;House hold activities   Patient Stated Goals less pain and normal ROM   Currently in Pain? Yes   Pain Score 2    Pain Location Shoulder   Pain Orientation Left;Lateral;Posterior   Pain Descriptors / Indicators Aching   Pain Type Surgical pain   Pain Onset More than a month ago   Pain Frequency Constant   Aggravating Factors  without pain meds and with any motions pain is a 10/10   Pain Relieving Factors with pain meds and in the sling the pain can be 0/10   Effect of Pain on Daily Activities limits everything            [facility] PT Assessment - 05/25/15 0001    Assessment   Medical Diagnosis s/p left  shoulder RC repair   Onset Date/Surgical Date 05/22/15   Hand Dominance Right   Next MD Visit 06/01/15   Prior Therapy no   Precautions   Precautions Shoulder   Type of Shoulder Precautions start AAROM, no abduction   Balance Screen   Has the patient fallen in the past 6 months No   Has the patient had a decrease in activity level because of a fear of falling?  No   Is the patient reluctant to leave their home because of a fear of falling?  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  [facility] Adult PT Treatment/Exercise - 05/25/15 0001    Manual Therapy   Manual Therapy Passive ROM   Passive ROM PROM for flexion, ER and IR, manual joint distraction and occilation                PT Education - 05/25/15 1011    Education provided Yes   Education Details HEP for AAROM/PROM for flexion ER/IR   Person(s) Educated Patient   Methods Explanation;Demonstration;Handout;Verbal cues   Comprehension Returned demonstration;Verbalized understanding          PT Short Term Goals - 05/25/15 1016    PT SHORT TERM GOAL #1   Title independent with initial HEP   Time 2   Period Weeks   Status New           PT Long Term Goals - 05/25/15 1016    PT LONG TERM GOAL #1   Title decrease pain 50%   Time 8   Period Weeks   Status New   PT LONG TERM GOAL #2   Title dress and do hair without help   Time 8   Period Weeks    Status New   PT LONG TERM GOAL #3   Title increase AROM of the left shoulder flexion to 140 degrees   Time 8   Period Weeks   Status New   PT LONG TERM GOAL #4   Title lift 2# to shoulder height shelf   Time 8   Period Weeks   Status New   PT LONG TERM GOAL #5   Title increase ER of the left shoulder to 65 degrees   Time 8   Period Weeks   Status New               Plan - 05/25/15 1012    Clinical Impression Statement Patient with left RC repair on 05/22/15.  Very painful wtih limited ROM, PROM to 60 degrees flexion.  Protocol is for no abduction.  She is right handed   Pt will benefit from skilled therapeutic intervention in order to improve on the following deficits Decreased strength;Increased edema;Pain;Impaired UE functional use   Rehab Potential Good   PT Frequency 2x / week   PT Duration 8 weeks   PT Treatment/Interventions Cryotherapy;Electrical Stimulation;Patient/family education;Therapeutic exercise;Therapeutic activities;Manual techniques;Passive range of motion   PT Next Visit Plan will add PROM/AAROM as tolerated, modalities for pain, no abduction   Consulted and Agree with Plan of Care Patient         Problem List Patient Active Problem List   Diagnosis Date Noted  . Maxillary sinusitis, acute 04/20/2015  . Sore throat 04/02/2015  . Screening for malignant neoplasm of the cervix 09/20/2013  . Routine gynecological examination 09/20/2013  . Central abdominal pain 09/08/2013  . S/P cervical discectomy 06/25/2013  . Trapezius muscle spasm 12/11/2012  . Shoulder pain 12/11/2012  . Skin tag 11/19/2012  . Keratosis 11/19/2012  . 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 Cough 03/11/2011  . Anxiety state 08/14/2010  . ELEVATED BP W/O HYPERTENSION 08/04/2009  . ROSACEA 07/25/2009  . MITRAL REGURGITATION 02/18/2009  . GERD 02/18/2009  . LUPUS 02/18/2009  . PALPITATIONS 02/18/2009    Jearld Lesch., PT 05/25/2015, 10:22 AM  [facility] [address] [facility] [address] [place], [place], [address] Phone: [phone]   Fax:  [phone]

## 2015-05-31 ENCOUNTER — Ambulatory Visit: Payer: BC Managed Care – PPO | Attending: Orthopedic Surgery | Admitting: Physical Therapy

## 2015-05-31 ENCOUNTER — Encounter: Payer: Self-pay | Admitting: Physical Therapy

## 2015-05-31 DIAGNOSIS — M25512 Pain in left shoulder: Secondary | ICD-10-CM | POA: Diagnosis not present

## 2015-05-31 DIAGNOSIS — M25612 Stiffness of left shoulder, not elsewhere classified: Secondary | ICD-10-CM | POA: Diagnosis present

## 2015-05-31 NOTE — Therapy (Signed)
[facility] [address] [place], [place], [address] Phone: [phone]   Fax:  [phone]  Physical Therapy Treatment  Patient Details  Name: Lynn Campos MRN: [id number] Date of Birth: 11-Dec-1970 Referring Provider:  Beverely Low, MD  Encounter Date: 05/31/2015      PT End of Session - 05/31/15 0927    Visit Number 2   PT Start Time 0847   PT Stop Time 0926   PT Time Calculation (min) 39 min   Activity Tolerance Patient limited by pain   Behavior During Therapy [facility] for tasks assessed/performed      Past Medical History  Diagnosis Date  . Mitral regurgitation   . Dyspnea   . GERD (gastroesophageal reflux disease)   . Lupus   . Palpitations   . Asthma   . Rosacea     Past Surgical History  Procedure Laterality Date  . Appendectomy    . Cholecystectomy  05/2010  . Cervical spine surgery  01/2010    There were no vitals filed for this visit.  Visit Diagnosis:  Left shoulder pain  Shoulder stiffness, left      Subjective Assessment - 05/31/15 0848    Subjective Pt reports that she has been doing a lot os stretching and has progressed well with that. Pt reports that she has been sleeping better.    Limitations Lifting;House hold activities   Patient Stated Goals less pain and normal ROM   Currently in Pain? Yes  more tight than anything    Pain Score 3    Pain Location Shoulder   Pain Orientation Left   Pain Descriptors / Indicators Aching   Pain Type Surgical pain   Pain Onset More than a month ago                        [facility] Adult PT Treatment/Exercise - 05/31/15 0001    Posture/Postural Control   Posture Comments presents in sling,    Shoulder Exercises: Supine   Flexion AAROM;5 reps   Manual Therapy   Manual Therapy Passive ROM;Joint mobilization;Manual Traction   Joint Mobilization glenohumeral joint    Passive ROM PROM for flexion, ER and IR, manual joint distraction  and oscillation                  PT Short Term Goals - 05/31/15 0931    PT SHORT TERM GOAL #1   Title independent with initial HEP   Status Achieved           PT Long Term Goals - 05/31/15 0931    PT LONG TERM GOAL #3   Title increase AROM of the left shoulder flexion to 140 degrees   Status On-going               Plan - 05/31/15 0928    Clinical Impression Statement Pt tolerated manual therapy well. Pt has made considerable gains with PROM during session and is making progress towards PT goals. Pt still limited to no abduction. Recommend continued PT services to address defects an increase functional I.   Pt will benefit from skilled therapeutic intervention in order to improve on the following deficits Decreased strength;Increased edema;Pain;Impaired UE functional use   Rehab Potential Good   PT Frequency 2x / week   PT Treatment/Interventions Patient/family education;Therapeutic exercise;Therapeutic activities;Manual techniques;Passive range of motion   PT Next Visit Plan will add PROM/AAROM as tolerated, modalities for pain,  no abduction   Consulted and Agree with Plan of Care Patient        Problem List Patient Active Problem List   Diagnosis Date Noted  . Maxillary sinusitis, acute 04/20/2015  . Sore throat 04/02/2015  . Screening for malignant neoplasm of the cervix 09/20/2013  . Routine gynecological examination 09/20/2013  . Central abdominal pain 09/08/2013  . S/P cervical discectomy 06/25/2013  . Trapezius muscle spasm 12/11/2012  . Shoulder pain 12/11/2012  . Skin tag 11/19/2012  . Keratosis 11/19/2012  . 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 Cough 03/11/2011  . Anxiety state 08/14/2010  . ELEVATED BP W/O HYPERTENSION 08/04/2009  . ROSACEA 07/25/2009  . MITRAL REGURGITATION 02/18/2009  . GERD 02/18/2009  . LUPUS 02/18/2009  . PALPITATIONS 02/18/2009    Grayce Sessionsonald G Lillyauna Jenkinson, PTA 05/31/2015, 9:32 AM  [facility] [address] [place], [place], [address] Phone: [phone]   Fax:  [phone]

## 2015-06-02 ENCOUNTER — Ambulatory Visit: Payer: BC Managed Care – PPO | Admitting: Physical Therapy

## 2015-06-02 DIAGNOSIS — M25512 Pain in left shoulder: Secondary | ICD-10-CM

## 2015-06-02 DIAGNOSIS — M25612 Stiffness of left shoulder, not elsewhere classified: Secondary | ICD-10-CM

## 2015-06-02 NOTE — Therapy (Signed)
[facility] [place] [address] [place], [place], [address] Phone: [phone]   Fax:  [phone]  Physical Therapy Treatment  Patient Details  Name: Lynn HootsJami Campos MRN: [id number] Date of Birth: Oct 19, 1971 Referring Provider:  Beverely LowNorris, Steve, MD  Encounter Date: 06/02/2015      PT End of Session - 06/02/15 1015    Visit Number 3   Date for PT Re-Evaluation 07/25/15   PT Start Time 0845   PT Stop Time 0955   PT Time Calculation (min) 70 min      Past Medical History  Diagnosis Date   Mitral regurgitation    Dyspnea    GERD (gastroesophageal reflux disease)    Lupus    Palpitations    Asthma    Rosacea     Past Surgical History  Procedure Laterality Date   Appendectomy     Cholecystectomy  05/2010   Cervical spine surgery  01/2010    There were no vitals filed for this visit.  Visit Diagnosis:  Left shoulder pain  Shoulder stiffness, left      Subjective Assessment - 06/02/15 0925    Subjective MD apointment went well yesterday. Did have increased soreness after MD had her ER in th eoffice.     Pain Score 3                          [facility] Adult PT Treatment/Exercise - 06/02/15 0001    Posture/Postural Control   Posture Comments presents in sling,    Exercises   Exercises Shoulder   Shoulder Exercises: Sidelying   Other Sidelying Exercises scapular mobilizations   Shoulder Exercises: Standing   Other Standing Exercises arms across chest diagonal trunk rotations 12x ea   Modalities   Modalities Cryotherapy;Electrical Stimulation   Cryotherapy   Number Minutes Cryotherapy 15 Minutes   Cryotherapy Location Shoulder   Type of Cryotherapy Ice pack   Electrical Stimulation   Electrical Stimulation Location Left shoulder   Electrical Stimulation Action IFC   Electrical Stimulation Parameters hi   Electrical Stimulation Goals Pain;Edema   Manual Therapy   Manual Therapy Passive  ROM;Joint mobilization;Manual Traction   Joint Mobilization glenohumeral joint    Passive ROM PROM for flexion, ER and IR, manual joint distraction and occilation                  PT Short Term Goals - 05/31/15 0931    PT SHORT TERM GOAL #1   Title independent with initial HEP   Status Achieved           PT Long Term Goals - 05/31/15 0931    PT LONG TERM GOAL #3   Title increase AROM of the left shoulder flexion to 140 degrees   Status On-going               Plan - 06/02/15 1016    Clinical Impression Statement Increased ROM throughout.  Tolerates gentle ROM progression with minimal pain.     Pt will benefit from skilled therapeutic intervention in order to improve on the following deficits Decreased strength;Increased edema;Pain;Impaired UE functional use   Rehab Potential Good   PT Frequency 2x / week   PT Duration 8 weeks   PT Treatment/Interventions Patient/family education;Therapeutic exercise;Therapeutic activities;Manual techniques;Passive range of motion   PT Next Visit Plan will add PROM/AAROM as tolerated, modalities for pain, no abduction  Problem List Patient Active Problem List   Diagnosis Date Noted   Maxillary sinusitis, acute 04/20/2015   Sore throat 04/02/2015   Screening for malignant neoplasm of the cervix 09/20/2013   Routine gynecological examination 09/20/2013   Central abdominal pain 09/08/2013   S/P cervical discectomy 06/25/2013   Trapezius muscle spasm 12/11/2012   Shoulder pain 12/11/2012   Skin tag 11/19/2012   Keratosis 11/19/2012   Asthma, intrinsic 10/14/2011   Cough 03/11/2011   Anxiety state 08/14/2010   ELEVATED BP W/O HYPERTENSION 08/04/2009   ROSACEA 07/25/2009   MITRAL REGURGITATION 02/18/2009   GERD 02/18/2009   LUPUS 02/18/2009   PALPITATIONS 02/18/2009    Tomie China, PTA 06/02/2015, 10:20 AM  [facility] [place] [address] [place], [place], [address] Phone: [phone]   Fax:  [phone]

## 2015-06-05 ENCOUNTER — Ambulatory Visit: Payer: BC Managed Care – PPO | Admitting: Physical Therapy

## 2015-06-05 DIAGNOSIS — M25612 Stiffness of left shoulder, not elsewhere classified: Secondary | ICD-10-CM

## 2015-06-05 DIAGNOSIS — M25512 Pain in left shoulder: Secondary | ICD-10-CM

## 2015-06-05 NOTE — Therapy (Signed)
[facility] [place] [address] [place], [place], [address] Phone: [phone]   Fax:  [phone]  Physical Therapy Treatment  Patient Details  Name: Lynn Campos MRN: [id number] Date of Birth: 01-May-1971 Referring Provider:  Beverely Low, MD  Encounter Date: 06/05/2015      PT End of Session - 06/05/15 0926    Visit Number 4   Date for PT Re-Evaluation 07/25/15   PT Start Time 0847   PT Stop Time 0943   PT Time Calculation (min) 56 min   Activity Tolerance Patient limited by pain   Behavior During Therapy [facility] for tasks assessed/performed      Past Medical History  Diagnosis Date  . Mitral regurgitation   . Dyspnea   . GERD (gastroesophageal reflux disease)   . Lupus   . Palpitations   . Asthma   . Rosacea     Past Surgical History  Procedure Laterality Date  . Appendectomy    . Cholecystectomy  05/2010  . Cervical spine surgery  01/2010    There were no vitals filed for this visit.  Visit Diagnosis:  Left shoulder pain  Shoulder stiffness, left      Subjective Assessment - 06/05/15 0849    Subjective bumped arm on counter and it's very sore this morning.  really sore over weekend   Patient Stated Goals less pain and normal ROM   Currently in Pain? Yes   Pain Score 5    Pain Location Shoulder   Pain Orientation Left   Pain Descriptors / Indicators Aching   Pain Type Surgical pain   Pain Onset More than a month ago   Pain Frequency Constant   Aggravating Factors  movement                         [facility] Adult PT Treatment/Exercise - 06/05/15 0851    Shoulder Exercises: Supine   External Rotation AAROM;Left;20 reps   Flexion AAROM;20 reps   Flexion Limitations with cane   Other Supine Exercises chest press with cane x 20   Shoulder Exercises: Isometric Strengthening   Flexion 5X5"   Extension 5X5"   External Rotation 5X5"   Internal Rotation 5X5"   Modalities   Modalities  Cryotherapy;Electrical Stimulation   Cryotherapy   Number Minutes Cryotherapy 15 Minutes   Cryotherapy Location Shoulder   Type of Cryotherapy Ice pack   Electrical Stimulation   Electrical Stimulation Location Left shoulder   Electrical Stimulation Action IFC   Electrical Stimulation Parameters to tolerance x 15 min   Electrical Stimulation Goals Pain;Edema   Manual Therapy   Manual Therapy Passive ROM   Passive ROM PROM for flexion, ER and IR, manual joint distraction and occilation                  PT Short Term Goals - 05/31/15 0931    PT SHORT TERM GOAL #1   Title independent with initial HEP   Status Achieved           PT Long Term Goals - 05/31/15 0931    PT LONG TERM GOAL #3   Title increase AROM of the left shoulder flexion to 140 degrees   Status On-going               Plan - 06/05/15 0926    Clinical Impression Statement AAROM continues to improve.  Initiated isometrics today without increase in pain.  PT Next Visit Plan will add PROM/AAROM as tolerated, modalities for pain, no abduction   Consulted and Agree with Plan of Care Patient        Problem List Patient Active Problem List   Diagnosis Date Noted  . Maxillary sinusitis, acute 04/20/2015  . Sore throat 04/02/2015  . Screening for malignant neoplasm of the cervix 09/20/2013  . Routine gynecological examination 09/20/2013  . Central abdominal pain 09/08/2013  . S/P cervical discectomy 06/25/2013  . Trapezius muscle spasm 12/11/2012  . Shoulder pain 12/11/2012  . Skin tag 11/19/2012  . Keratosis 11/19/2012  . 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 Cough 03/11/2011  . Anxiety state 08/14/2010  . ELEVATED BP W/O HYPERTENSION 08/04/2009  . ROSACEA 07/25/2009  . MITRAL REGURGITATION 02/18/2009  . GERD 02/18/2009  . LUPUS 02/18/2009  . PALPITATIONS 02/18/2009   Clarita CraneStephanie F Travaughn Vue, PT, DPT 06/05/2015 10:13 AM  [facility] [address] [address] [place], [place], [address] Phone: [phone]   Fax:  [phone]

## 2015-06-08 ENCOUNTER — Ambulatory Visit: Payer: BC Managed Care – PPO | Admitting: Physical Therapy

## 2015-06-08 DIAGNOSIS — M25612 Stiffness of left shoulder, not elsewhere classified: Secondary | ICD-10-CM

## 2015-06-08 DIAGNOSIS — M25512 Pain in left shoulder: Secondary | ICD-10-CM | POA: Diagnosis not present

## 2015-06-08 NOTE — Therapy (Signed)
[facility] [address] [place], [place], [address] Phone: [phone]   Fax:  [phone]  Physical Therapy Treatment  Patient Details  Name: Lynn HootsJami Koopman MRN: [id number] Date of Birth: August 02, 1971 Referring Provider:  Beverely LowNorris, Steve, MD  Encounter Date: 06/08/2015      PT End of Session - 06/08/15 0926    Visit Number 5   PT Start Time 0845   PT Stop Time 0940   PT Time Calculation (min) 55 min      Past Medical History  Diagnosis Date  . Mitral regurgitation   . Dyspnea   . GERD (gastroesophageal reflux disease)   . Lupus   . Palpitations   . Asthma   . Rosacea     Past Surgical History  Procedure Laterality Date  . Appendectomy    . Cholecystectomy  05/2010  . Cervical spine surgery  01/2010    There were no vitals filed for this visit.  Visit Diagnosis:  Left shoulder pain  Shoulder stiffness, left          [facility] PT Assessment - 06/08/15 0001    PROM   Left Shoulder Flexion 150 Degrees   Left Shoulder Internal Rotation 75 Degrees   Left Shoulder External Rotation 23 Degrees                     [facility] Adult PT Treatment/Exercise - 06/08/15 0001    Shoulder Exercises: Supine   Other Supine Exercises AA chest press,flex,IR/ER 15 times each   Shoulder Exercises: Standing   Other Standing Exercises finger ladder flex 5 times   Shoulder Exercises: Pulleys   Flexion --  15   Other Pulley Exercises IR,ext  15   Shoulder Exercises: Therapy Ball   Flexion 20 reps   Other Therapy Ball Exercises CC/CW  15   Modalities   Modalities Cryotherapy   Cryotherapy   Number Minutes Cryotherapy 15 Minutes   Cryotherapy Location Shoulder   Type of Cryotherapy Ice pack   Electrical Stimulation   Electrical Stimulation Location Left shoulder   Electrical Stimulation Action IFC   Electrical Stimulation Goals Pain;Edema   Manual Therapy   Manual Therapy Joint mobilization;Soft tissue  mobilization;Passive ROM                PT Education - 06/08/15 0926    Education Details added ext to pulleys   Person(s) Educated Patient   Methods Explanation;Demonstration   Comprehension Verbalized understanding;Returned demonstration          PT Short Term Goals - 05/31/15 0931    PT SHORT TERM GOAL #1   Title independent with initial HEP   Status Achieved           PT Long Term Goals - 06/08/15 0927    PT LONG TERM GOAL #1   Title decrease pain 50%   Status On-going   PT LONG TERM GOAL #2   Title dress and do hair without help   Status On-going   PT LONG TERM GOAL #3   Title increase AROM of the left shoulder flexion to 140 degrees   Status On-going   PT LONG TERM GOAL #4   Title lift 2# to shoulder height shelf   Status On-going   PT LONG TERM GOAL #5   Title increase ER of the left shoulder to 65 degrees   Status On-going  Plan - 06/08/15 0926    Clinical Impression Statement making great PROM improvements,explained to pt  PROM and AA, no Act to let tendon heal,elbow mvmt only   PT Next Visit Plan will add PROM/AAROM as tolerated, modalities for pain, no abduction        Problem List Patient Active Problem List   Diagnosis Date Noted  . Maxillary sinusitis, acute 04/20/2015  . Sore throat 04/02/2015  . Screening for malignant neoplasm of the cervix 09/20/2013  . Routine gynecological examination 09/20/2013  . Central abdominal pain 09/08/2013  . S/P cervical discectomy 06/25/2013  . Trapezius muscle spasm 12/11/2012  . Shoulder pain 12/11/2012  . Skin tag 11/19/2012  . Keratosis 11/19/2012  . 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 Cough 03/11/2011  . Anxiety state 08/14/2010  . ELEVATED BP W/O HYPERTENSION 08/04/2009  . ROSACEA 07/25/2009  . MITRAL REGURGITATION 02/18/2009  . GERD 02/18/2009  . LUPUS 02/18/2009  . PALPITATIONS 02/18/2009    Aidaly Cordner,ANGIE PTA 06/08/2015, 9:28 AM  [facility] [place] [address] [place], [place], [address] Phone: [phone]   Fax:  [phone]

## 2015-06-13 ENCOUNTER — Ambulatory Visit: Payer: BC Managed Care – PPO | Admitting: Physical Therapy

## 2015-06-13 ENCOUNTER — Encounter: Payer: Self-pay | Admitting: Physical Therapy

## 2015-06-13 DIAGNOSIS — M25612 Stiffness of left shoulder, not elsewhere classified: Secondary | ICD-10-CM

## 2015-06-13 DIAGNOSIS — M25512 Pain in left shoulder: Secondary | ICD-10-CM

## 2015-06-13 NOTE — Therapy (Signed)
[facility] [place] [address] [place], [place], [address] Phone: [phone]   Fax:  [phone]  Physical Therapy Treatment  Patient Details  Name: Lynn Campos MRN: [id number] Date of Birth: January 20, 1971 Referring Provider:  Beverely Low, MD  Encounter Date: 06/13/2015      PT End of Session - 06/13/15 0941    Visit Number 6   Date for PT Re-Evaluation 07/25/15   PT Start Time 0845   PT Stop Time 0950   PT Time Calculation (min) 65 min      Past Medical History  Diagnosis Date  . Mitral regurgitation   . Dyspnea   . GERD (gastroesophageal reflux disease)   . Lupus   . Palpitations   . Asthma   . Rosacea     Past Surgical History  Procedure Laterality Date  . Appendectomy    . Cholecystectomy  05/2010  . Cervical spine surgery  01/2010    There were no vitals filed for this visit.  Visit Diagnosis:  Left shoulder pain  Shoulder stiffness, left      Subjective Assessment - 06/13/15 0938    Subjective good and bad days   Currently in Pain? Yes   Pain Score 4    Pain Location Shoulder   Pain Orientation Left                         [facility] Adult PT Treatment/Exercise - 06/13/15 0001    Shoulder Exercises: Standing   Shoulder Elevation Strengthening;Both  shruggs and backward rolls 15 times 3#   Other Standing Exercises finger ladder flex 5 times   Other Standing Exercises cane ex 10 imes flex,chest press,IR ,ext   Shoulder Exercises: Therapy Ball   Flexion 20 reps  CC and CW   Shoulder Exercises: ROM/Strengthening   Other ROM/Strengthening Exercises yelloe tband ext and retraction   Other ROM/Strengthening Exercises tricep ext 15# 15 times   Modalities   Modalities Cryotherapy   Cryotherapy   Number Minutes Cryotherapy 15 Minutes   Cryotherapy Location Shoulder   Type of Cryotherapy Ice pack   Electrical Stimulation   Electrical Stimulation Location Left shoulder   Electrical  Stimulation Action IFC   Electrical Stimulation Goals Pain;Edema   Manual Therapy   Manual Therapy Joint mobilization;Soft tissue mobilization;Passive ROM   Joint Mobilization glenohumeral joint    Passive ROM PROM for flexion, ER and IR, manual joint distraction and occilation                  PT Short Term Goals - 05/31/15 0931    PT SHORT TERM GOAL #1   Title independent with initial HEP   Status Achieved           PT Long Term Goals - 06/08/15 0272    PT LONG TERM GOAL #1   Title decrease pain 50%   Status On-going   PT LONG TERM GOAL #2   Title dress and do hair without help   Status On-going   PT LONG TERM GOAL #3   Title increase AROM of the left shoulder flexion to 140 degrees   Status On-going   PT LONG TERM GOAL #4   Title lift 2# to shoulder height shelf   Status On-going   PT LONG TERM GOAL #5   Title increase ER of the left shoulder to 65 degrees   Status On-going  Plan - 06/13/15 0941    Clinical Impression Statement pt with increased tolerance to ROM and AA beginner ther ex   PT Next Visit Plan will add PROM/AAROM as tolerated, modalities for pain, no abduction        Problem List Patient Active Problem List   Diagnosis Date Noted  . Maxillary sinusitis, acute 04/20/2015  . Sore throat 04/02/2015  . Screening for malignant neoplasm of the cervix 09/20/2013  . Routine gynecological examination 09/20/2013  . Central abdominal pain 09/08/2013  . S/P cervical discectomy 06/25/2013  . Trapezius muscle spasm 12/11/2012  . Shoulder pain 12/11/2012  . Skin tag 11/19/2012  . Keratosis 11/19/2012  . 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 Cough 03/11/2011  . Anxiety state 08/14/2010  . ELEVATED BP W/O HYPERTENSION 08/04/2009  . ROSACEA 07/25/2009  . MITRAL REGURGITATION 02/18/2009  . GERD 02/18/2009  . LUPUS 02/18/2009  . PALPITATIONS 02/18/2009    PAYSEUR,ANGIE PTA 06/13/2015, 9:48 AM  [facility] [place] [address] [place], [place], [address] Phone: [phone]   Fax:  [phone]

## 2015-06-15 ENCOUNTER — Ambulatory Visit: Payer: BC Managed Care – PPO | Admitting: Physical Therapy

## 2015-06-15 ENCOUNTER — Encounter: Payer: Self-pay | Admitting: Physical Therapy

## 2015-06-15 DIAGNOSIS — M25512 Pain in left shoulder: Secondary | ICD-10-CM

## 2015-06-15 DIAGNOSIS — M25612 Stiffness of left shoulder, not elsewhere classified: Secondary | ICD-10-CM

## 2015-06-15 NOTE — Therapy (Signed)
[facility] [place] [address] [place], [place], [address] Phone: [phone]   Fax:  [phone]  Physical Therapy Treatment  Patient Details  Name: Lynn Campos MRN: [id number] Date of Birth: 1971-08-20 Referring Provider:  Beverely Low, MD  Encounter Date: 06/15/2015      PT End of Session - 06/15/15 1011    Visit Number 7   Date for PT Re-Evaluation 07/25/15   PT Start Time 0845   PT Stop Time 0945   PT Time Calculation (min) 60 min      Past Medical History  Diagnosis Date  . Mitral regurgitation   . Dyspnea   . GERD (gastroesophageal reflux disease)   . Lupus   . Palpitations   . Asthma   . Rosacea     Past Surgical History  Procedure Laterality Date  . Appendectomy    . Cholecystectomy  05/2010  . Cervical spine surgery  01/2010    There were no vitals filed for this visit.  Visit Diagnosis:  Left shoulder pain  Shoulder stiffness, left      Subjective Assessment - 06/15/15 1005    Subjective sore na dtight, trying to decrease pain meds, making progress   Currently in Pain? Yes   Pain Score 6    Pain Location Shoulder   Pain Orientation Left            [facility] PT Assessment - 06/15/15 0001    PROM   Left Shoulder Flexion 150 Degrees   Left Shoulder Internal Rotation 90 Degrees   Left Shoulder External Rotation 40 Degrees                     [facility] Adult PT Treatment/Exercise - 06/15/15 0001    Shoulder Exercises: Standing   Flexion AAROM;Left;10 reps  2 sets PTA assistance   Other Standing Exercises wall slides with PTA assistance   Other Standing Exercises cane ex 10 imes flex,chest press,IR ,ext   Shoulder Exercises: ROM/Strengthening   Other ROM/Strengthening Exercises yelloe tband ext and retraction   Other ROM/Strengthening Exercises quadreped weight shifta nd weight bearing activities   Electrical Stimulation   Electrical Stimulation Location Left shoulder   Electrical Stimulation Action IFC   Electrical Stimulation Goals Pain   Manual Therapy   Manual Therapy Joint mobilization;Soft tissue mobilization;Passive ROM   Passive ROM PROM for flexion, ER and IR, manual joint distraction and occilation                  PT Short Term Goals - 05/31/15 0931    PT SHORT TERM GOAL #1   Title independent with initial HEP   Status Achieved           PT Long Term Goals - 06/15/15 1013    PT LONG TERM GOAL #1   Title decrease pain 50%   Status On-going   PT LONG TERM GOAL #2   Title dress and do hair without help   Status On-going   PT LONG TERM GOAL #3   Title increase AROM of the left shoulder flexion to 140 degrees   Status On-going   PT LONG TERM GOAL #4   Title lift 2# to shoulder height shelf   Status On-going   PT LONG TERM GOAL #5   Title increase ER of the left shoulder to 65 degrees   Status On-going  Plan - 06/15/15 1011    Clinical Impression Statement pt is making progress with PROM ( more tight and resistive today), tolerating start of AA ther ex well.   PT Next Visit Plan on vacation next week, then MD after return -write note        Problem List Patient Active Problem List   Diagnosis Date Noted  . Maxillary sinusitis, acute 04/20/2015  . Sore throat 04/02/2015  . Screening for malignant neoplasm of the cervix 09/20/2013  . Routine gynecological examination 09/20/2013  . Central abdominal pain 09/08/2013  . S/P cervical discectomy 06/25/2013  . Trapezius muscle spasm 12/11/2012  . Shoulder pain 12/11/2012  . Skin tag 11/19/2012  . Keratosis 11/19/2012  . 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 Cough 03/11/2011  . Anxiety state 08/14/2010  . ELEVATED BP W/O HYPERTENSION 08/04/2009  . ROSACEA 07/25/2009  . MITRAL REGURGITATION 02/18/2009  . GERD 02/18/2009  . LUPUS 02/18/2009  . PALPITATIONS 02/18/2009    Bianco Cange,ANGIE PTA 06/15/2015, 10:14 AM  [facility] [place] [address] [place], [place], [address] Phone: [phone]   Fax:  [phone]

## 2015-06-27 ENCOUNTER — Ambulatory Visit: Payer: BC Managed Care – PPO | Attending: Orthopedic Surgery | Admitting: Physical Therapy

## 2015-06-27 ENCOUNTER — Encounter: Payer: Self-pay | Admitting: Physical Therapy

## 2015-06-27 DIAGNOSIS — M25612 Stiffness of left shoulder, not elsewhere classified: Secondary | ICD-10-CM | POA: Diagnosis present

## 2015-06-27 DIAGNOSIS — M25512 Pain in left shoulder: Secondary | ICD-10-CM | POA: Diagnosis not present

## 2015-06-27 NOTE — Therapy (Signed)
[facility] [address] [place], [place], [address] Phone: [phone]   Fax:  [phone]  Physical Therapy Treatment  Patient Details  Name: Lynn Campos MRN: [id number] Date of Birth: [birth year] Referring Provider:  Beverely Low, MD  Encounter Date: 06/27/2015      PT End of Session - 06/27/15 0952    Visit Number 8   Date for PT Re-Evaluation 07/25/15   PT Start Time 0849   PT Stop Time 1004   PT Time Calculation (min) 75 min   Activity Tolerance Patient tolerated treatment well   Behavior During Therapy [place] for tasks assessed/performed      Past Medical History  Diagnosis Date  . Mitral regurgitation   . Dyspnea   . GERD (gastroesophageal reflux disease)   . Lupus   . Palpitations   . Asthma   . Rosacea     Past Surgical History  Procedure Laterality Date  . Appendectomy    . Cholecystectomy  05/2010  . Cervical spine surgery  01/2010    There were no vitals filed for this visit.  Visit Diagnosis:  Left shoulder pain  Shoulder stiffness, left      Subjective Assessment - 06/27/15 0851    Subjective Pt states she went on vacation last week and she was pulling out a drawer in the fridge and it was broken, I caught the drawer with the L arm and now I am having some pain in the outside of the arm and a knot in the deltoid.   Currently in Pain? No/denies            [facility] PT Assessment - 06/27/15 0001    PROM   Left Shoulder Flexion 145 Degrees   Left Shoulder Internal Rotation 80 Degrees   Left Shoulder External Rotation 45 Degrees                     [facility] Adult PT Treatment/Exercise - 06/27/15 0001    Shoulder Exercises: Standing   Flexion AAROM;Left;10 reps  2 sets, assist   Other Standing Exercises wall slides with SPT assistance   Other Standing Exercises cane ex 10 imes flex,chest press,IR ,ext  IR with pillow case   Shoulder Exercises: ROM/Strengthening   Other  ROM/Strengthening Exercises yellow tband ext and retraction  1 x 10   Other ROM/Strengthening Exercises quadruped weight shift and weight bearing activities                PT Education - 06/27/15 0952    Education provided Yes   Education Details continue HEP   Person(s) Educated Patient   Methods Explanation   Comprehension Verbalized understanding          PT Short Term Goals - 05/31/15 0931    PT SHORT TERM GOAL #1   Title independent with initial HEP   Status Achieved           PT Long Term Goals - 06/15/15 1013    PT LONG TERM GOAL #1   Title decrease pain 50%   Status On-going   PT LONG TERM GOAL #2   Title dress and do hair without help   Status On-going   PT LONG TERM GOAL #3   Title increase AROM of the left shoulder flexion to 140 degrees   Status On-going   PT LONG TERM GOAL #4   Title lift 2# to shoulder height shelf   Status On-going  PT LONG TERM GOAL #5   Title increase ER of the left shoulder to 65 degrees   Status On-going               Plan - 06/27/15 0954    Clinical Impression Statement Pt has increased soreness and slight decreased ROM from previous treatment session.  Pt complains of band type pain with ER around deltoid tuberosity which started after she caught a drawer that fell from the fridge while on vacation last week. Pt will have MD appt on Thursday morning and report back to rehab on Friday.     Pt will benefit from skilled therapeutic intervention in order to improve on the following deficits Decreased strength;Increased edema;Pain;Impaired UE functional use   Rehab Potential Good   PT Frequency 2x / week   PT Duration 8 weeks   PT Treatment/Interventions Patient/family education;Therapeutic exercise;Therapeutic activities;Manual techniques;Passive range of motion   PT Next Visit Plan Continue with AAROM, PROM, work on exercises per MD protocol   Consulted and Agree with Plan of Care Patient        Problem  List Patient Active Problem List   Diagnosis Date Noted  . Maxillary sinusitis, acute 04/20/2015  . Sore throat 04/02/2015  . Screening for malignant neoplasm of the cervix 09/20/2013  . Routine gynecological examination 09/20/2013  . Central abdominal pain 09/08/2013  . S/P cervical discectomy 06/25/2013  . Trapezius muscle spasm 12/11/2012  . Shoulder pain 12/11/2012  . Skin tag 11/19/2012  . Keratosis 11/19/2012  . 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 Cough 03/11/2011  . Anxiety state 08/14/2010  . ELEVATED BP W/O HYPERTENSION 08/04/2009  . ROSACEA 07/25/2009  . MITRAL REGURGITATION 02/18/2009  . GERD 02/18/2009  . LUPUS 02/18/2009  . PALPITATIONS 02/18/2009    Jearld Lesch., PT 06/27/2015, 10:48 AM  [facility]- [address] [place] [address] [place], [place], [address] Phone: [phone]   Fax:  [phone]

## 2015-06-30 ENCOUNTER — Encounter: Payer: Self-pay | Admitting: Physical Therapy

## 2015-06-30 ENCOUNTER — Ambulatory Visit: Payer: BC Managed Care – PPO | Admitting: Physical Therapy

## 2015-06-30 DIAGNOSIS — M25512 Pain in left shoulder: Secondary | ICD-10-CM | POA: Diagnosis not present

## 2015-06-30 DIAGNOSIS — M25612 Stiffness of left shoulder, not elsewhere classified: Secondary | ICD-10-CM

## 2015-06-30 NOTE — Therapy (Signed)
[facility] [place] [address] [place], [place], [address] Phone: [phone]   Fax:  [phone]  Physical Therapy Treatment  Patient Details  Name: Lynn Campos MRN: [id number] Date of Birth: 1971-05-03 Referring Provider:  Beverely Low, MD  Encounter Date: 06/30/2015      PT End of Session - 06/30/15 0949    Visit Number 9   Date for PT Re-Evaluation 07/25/15   PT Start Time 0848   PT Stop Time 0945   PT Time Calculation (min) 57 min   Activity Tolerance Patient tolerated treatment well   Behavior During Therapy [facility] for tasks assessed/performed      Past Medical History  Diagnosis Date  . Mitral regurgitation   . Dyspnea   . GERD (gastroesophageal reflux disease)   . Lupus   . Palpitations   . Asthma   . Rosacea     Past Surgical History  Procedure Laterality Date  . Appendectomy    . Cholecystectomy  05/2010  . Cervical spine surgery  01/2010    There were no vitals filed for this visit.  Visit Diagnosis:  Left shoulder pain  Shoulder stiffness, left      Subjective Assessment - 06/30/15 0849    Subjective Pt states she had a dr appt yesterday and the doctor was very pleased.  Can no do full AAROM, AROM as tolerated, periscapular strengthening, progress strengthening with therabands having elbows close.     Currently in Pain? Yes   Pain Score 2    Pain Location Shoulder   Pain Orientation Left   Pain Descriptors / Indicators Aching   Pain Type Surgical pain                         [facility] Adult PT Treatment/Exercise - 06/30/15 0001    Shoulder Exercises: Supine   Protraction Strengthening;Left;10 reps  2 sets, towel under elbow   Other Supine Exercises proprioception, rhythmic stabilization  2 x 30 sec   Shoulder Exercises: Standing   Extension Strengthening;Both;10 reps  2 sets   Theraband Level (Shoulder Extension) Level 1 (Yellow)   Row Strengthening;Both;10 reps  2 sets    Theraband Level (Shoulder Row) Level 1 (Yellow)   Shoulder Elevation AROM;Left;10 reps  2 sets   Other Standing Exercises wall slides 2 x 10, AROM   Other Standing Exercises shoulder rolls foward/backward 2 x 10   Shoulder Exercises: ROM/Strengthening   Other ROM/Strengthening Exercises quadruped weight shift and weight bearing activities  2 x 10   Modalities   Modalities Moist Heat   Cryotherapy   Number Minutes Cryotherapy 15 Minutes   Cryotherapy Location Shoulder   Electrical Stimulation   Electrical Stimulation Location Left shoulder   Electrical Stimulation Action IFC   Electrical Stimulation Goals Pain   Manual Therapy   Manual Therapy Passive ROM   Passive ROM PROM for flexion, ER and IR, manual joint distraction and occilation                  PT Short Term Goals - 05/31/15 0931    PT SHORT TERM GOAL #1   Title independent with initial HEP   Status Achieved           PT Long Term Goals - 06/15/15 1013    PT LONG TERM GOAL #1   Title decrease pain 50%   Status On-going   PT LONG TERM GOAL #2   Title  dress and do hair without help   Status On-going   PT LONG TERM GOAL #3   Title increase AROM of the left shoulder flexion to 140 degrees   Status On-going   PT LONG TERM GOAL #4   Title lift 2# to shoulder height shelf   Status On-going   PT LONG TERM GOAL #5   Title increase ER of the left shoulder to 65 degrees   Status On-going               Plan - 06/30/15 0950    Clinical Impression Statement Pt is doing very well, had a good report from her dr. appt on Wed. and can now start on AROM and strengthening, she is sling free.  The estim and heat really worked well on her shoulder and decreasing the "knot".   Rehab Potential Good   PT Frequency 2x / week   PT Duration 8 weeks   PT Treatment/Interventions Patient/family education;Therapeutic exercise;Therapeutic activities;Manual techniques;Passive range of motion   PT Next Visit Plan  scapular stabilization, PROM, proprioception, AROM, strength   Consulted and Agree with Plan of Care Patient        Problem List Patient Active Problem List   Diagnosis Date Noted  . Maxillary sinusitis, acute 04/20/2015  . Sore throat 04/02/2015  . Screening for malignant neoplasm of the cervix 09/20/2013  . Routine gynecological examination 09/20/2013  . Central abdominal pain 09/08/2013  . S/P cervical discectomy 06/25/2013  . Trapezius muscle spasm 12/11/2012  . Shoulder pain 12/11/2012  . Skin tag 11/19/2012  . Keratosis 11/19/2012  . 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 Cough 03/11/2011  . Anxiety state 08/14/2010  . ELEVATED BP W/O HYPERTENSION 08/04/2009  . ROSACEA 07/25/2009  . MITRAL REGURGITATION 02/18/2009  . GERD 02/18/2009  . LUPUS 02/18/2009  . PALPITATIONS 02/18/2009    Jearld Lesch., PT 06/30/2015, 11:44 AM  [facility]- [address] [place] [address] [place], [place], [address] Phone: [phone]   Fax:  [phone]

## 2015-07-04 ENCOUNTER — Ambulatory Visit: Payer: BC Managed Care – PPO | Admitting: Physical Therapy

## 2015-07-04 ENCOUNTER — Encounter: Payer: Self-pay | Admitting: Physical Therapy

## 2015-07-04 DIAGNOSIS — M25512 Pain in left shoulder: Secondary | ICD-10-CM

## 2015-07-04 DIAGNOSIS — M25612 Stiffness of left shoulder, not elsewhere classified: Secondary | ICD-10-CM

## 2015-07-04 NOTE — Therapy (Signed)
[facility] [place] [address] [place], [place], [address] Phone: [phone]   Fax:  [phone]  Physical Therapy Treatment  Patient Details  Name: Lynn Campos MRN: [id number] Date of Birth: Aug 09, 1971 Referring Provider:  Beverely Low, MD  Encounter Date: 07/04/2015      PT End of Session - 07/04/15 0942    Visit Number 10   Date for PT Re-Evaluation 07/25/15   PT Start Time 0848   PT Stop Time 0952   PT Time Calculation (min) 64 min      Past Medical History  Diagnosis Date  . Mitral regurgitation   . Dyspnea   . GERD (gastroesophageal reflux disease)   . Lupus   . Palpitations   . Asthma   . Rosacea     Past Surgical History  Procedure Laterality Date  . Appendectomy    . Cholecystectomy  05/2010  . Cervical spine surgery  01/2010    There were no vitals filed for this visit.  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 Yes   Pain Score 2    Pain Orientation Left                         [facility] Adult PT Treatment/Exercise - 07/04/15 0001    Shoulder Exercises: Standing   Extension AROM;Strengthening;Both;15 reps;Theraband   Theraband Level (Shoulder Extension) Level 2 (Red)   Row AROM;Strengthening;Both;15 reps;Theraband   Theraband Level (Shoulder Row) Level 2 (Red)   Other Standing Exercises 1# flex,ext,abd 2 sets 10   Other Standing Exercises red tband IR /ER 2 set s10   Shoulder Exercises: ROM/Strengthening   UBE (Upper Arm Bike) 81fwd/2 back L 2   Cybex Row 10 reps;1.5 plate  2 sets   Wall Pushups 15 reps  with ball   Electrical Stimulation   Electrical Stimulation Location Left shoulder   Electrical Stimulation Action IFC   Electrical Stimulation Goals Pain   Manual Therapy   Manual Therapy Passive ROM;Manual  Traction;Joint mobilization                  PT Short Term Goals - 05/31/15 0931    PT SHORT TERM GOAL #1   Title independent with initial HEP   Status Achieved           PT Long Term Goals - 06/15/15 1013    PT LONG TERM GOAL #1   Title decrease pain 50%   Status On-going   PT LONG TERM GOAL #2   Title dress and do hair without help   Status On-going   PT LONG TERM GOAL #3   Title increase AROM of the left shoulder flexion to 140 degrees   Status On-going   PT LONG TERM GOAL #4   Title lift 2# to shoulder height shelf   Status On-going   PT LONG TERM GOAL #5   Title increase ER of the left shoulder to 65 degrees   Status On-going               Plan - 07/04/15 0943    Clinical Impression Statement pt tolerated AA and Active ther ex withminimal pain but weakness noted   PT Next Visit Plan scapular stabilization, PROM, proprioception, AROM, strength  Problem List Patient Active Problem List   Diagnosis Date Noted  . Maxillary sinusitis, acute 04/20/2015  . Sore throat 04/02/2015  . Screening for malignant neoplasm of the cervix 09/20/2013  . Routine gynecological examination 09/20/2013  . Central abdominal pain 09/08/2013  . S/P cervical discectomy 06/25/2013  . Trapezius muscle spasm 12/11/2012  . Shoulder pain 12/11/2012  . Skin tag 11/19/2012  . Keratosis 11/19/2012  . 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 Cough 03/11/2011  . Anxiety state 08/14/2010  . ELEVATED BP W/O HYPERTENSION 08/04/2009  . ROSACEA 07/25/2009  . MITRAL REGURGITATION 02/18/2009  . GERD 02/18/2009  . LUPUS 02/18/2009  . PALPITATIONS 02/18/2009    Hashem Goynes,ANGIE PTA 07/04/2015, 9:44 AM  [facility] [place] [address] [place], [place], [address] Phone: [phone]   Fax:  [phone]

## 2015-07-06 ENCOUNTER — Ambulatory Visit: Payer: BC Managed Care – PPO | Admitting: Physical Therapy

## 2015-07-06 DIAGNOSIS — M25612 Stiffness of left shoulder, not elsewhere classified: Secondary | ICD-10-CM

## 2015-07-06 DIAGNOSIS — M25512 Pain in left shoulder: Secondary | ICD-10-CM | POA: Diagnosis not present

## 2015-07-06 NOTE — Therapy (Signed)
[facility] [place] [address] [place], [place], [address] Phone: [phone]   Fax:  [phone]  Physical Therapy Treatment  Patient Details  Name: Lynn Campos MRN: [id number] Date of Birth: [birth year] Referring Provider:  Beverely Low, MD  Encounter Date: 07/06/2015      PT End of Session - 07/06/15 0949    Visit Number 11   Date for PT Re-Evaluation 07/25/15   PT Start Time 0845   PT Stop Time 0940   PT Time Calculation (min) 55 min      Past Medical History  Diagnosis Date  . Mitral regurgitation   . Dyspnea   . GERD (gastroesophageal reflux disease)   . Lupus   . Palpitations   . Asthma   . Rosacea     Past Surgical History  Procedure Laterality Date  . Appendectomy    . Cholecystectomy  05/2010  . Cervical spine surgery  01/2010    There were no vitals filed for this visit.  Visit Diagnosis:  Left shoulder pain  Shoulder stiffness, left          [facility] PT Assessment - 07/06/15 0001    PROM   Left Shoulder Flexion 154 Degrees   Left Shoulder Internal Rotation 85 Degrees   Left Shoulder External Rotation 50 Degrees                     [facility] Adult PT Treatment/Exercise - 07/06/15 0001    Shoulder Exercises: Supine   Other Supine Exercises rhy stab with PNF 3 #   Shoulder Exercises: Standing   External Rotation Strengthening;Left;10 reps;Theraband  2 sets   Theraband Level (Shoulder External Rotation) Level 2 (Red)   Internal Rotation Strengthening;Left;10 reps;Theraband  2 sets   Theraband Level (Shoulder Internal Rotation) Level 2 (Red)   Extension AROM;Strengthening;Both;15 reps;Theraband   Theraband Level (Shoulder Extension) Level 2 (Red)   Row AROM;Strengthening;Both;15 reps;Theraband   Theraband Level (Shoulder Row) Level 2 (Red)   Other Standing Exercises finger ladder flexion 8 times eccentric lowering   Other Standing Exercises towel slide on wall 4 directions 15  times   Electrical Stimulation   Electrical Stimulation Location Left shoulder   Electrical Stimulation Action IFC  with ice   Electrical Stimulation Goals Pain   Manual Therapy   Manual Therapy Passive ROM;Manual Traction;Joint mobilization  joint capsule stretch                  PT Short Term Goals - 05/31/15 0931    PT SHORT TERM GOAL #1   Title independent with initial HEP   Status Achieved           PT Long Term Goals - 06/15/15 1013    PT LONG TERM GOAL #1   Title decrease pain 50%   Status On-going   PT LONG TERM GOAL #2   Title dress and do hair without help   Status On-going   PT LONG TERM GOAL #3   Title increase AROM of the left shoulder flexion to 140 degrees   Status On-going   PT LONG TERM GOAL #4   Title lift 2# to shoulder height shelf   Status On-going   PT LONG TERM GOAL #5   Title increase ER of the left shoulder to 65 degrees   Status On-going               Plan - 07/06/15 0951    Clinical  Impression Statement pt with improved AROM, still painfula nd limited with abd and slowly increasing as pt tolerates.On target for RTC recovery   PT Next Visit Plan scapular stabilization, PROM, proprioception, AROM, strength        Problem List Patient Active Problem List   Diagnosis Date Noted  . Maxillary sinusitis, acute 04/20/2015  . Sore throat 04/02/2015  . Screening for malignant neoplasm of the cervix 09/20/2013  . Routine gynecological examination 09/20/2013  . Central abdominal pain 09/08/2013  . S/P cervical discectomy 06/25/2013  . Trapezius muscle spasm 12/11/2012  . Shoulder pain 12/11/2012  . Skin tag 11/19/2012  . Keratosis 11/19/2012  . 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 Cough 03/11/2011  . Anxiety state 08/14/2010  . ELEVATED BP W/O HYPERTENSION 08/04/2009  . ROSACEA 07/25/2009  . MITRAL REGURGITATION 02/18/2009  . GERD 02/18/2009  . LUPUS 02/18/2009  . PALPITATIONS 02/18/2009    Tong Pieczynski,ANGIE  PTA 07/06/2015, 9:52 AM  [facility]- [place] [address] [place], [place], [address] Phone: [phone]   Fax:  [phone]

## 2015-07-11 ENCOUNTER — Ambulatory Visit: Payer: BC Managed Care – PPO | Admitting: Physical Therapy

## 2015-07-11 ENCOUNTER — Encounter: Payer: Self-pay | Admitting: Physical Therapy

## 2015-07-11 DIAGNOSIS — M25612 Stiffness of left shoulder, not elsewhere classified: Secondary | ICD-10-CM

## 2015-07-11 DIAGNOSIS — M25512 Pain in left shoulder: Secondary | ICD-10-CM | POA: Diagnosis not present

## 2015-07-11 NOTE — Therapy (Signed)
[facility]- [place] [address] [place], [place], [address] Phone: [phone]   Fax:  [phone]  Physical Therapy Treatment  Patient Details  Name: Lynn Campos MRN: [id number] Date of Birth: [birth year] Referring Provider:  Beverely Low, MD  Encounter Date: 07/11/2015      PT End of Session - 07/11/15 0933    Visit Number 12   PT Start Time 0845   PT Stop Time 0945   PT Time Calculation (min) 60 min      Past Medical History  Diagnosis Date  . Mitral regurgitation   . Dyspnea   . GERD (gastroesophageal reflux disease)   . Lupus   . Palpitations   . Asthma   . Rosacea     Past Surgical History  Procedure Laterality Date  . Appendectomy    . Cholecystectomy  05/2010  . Cervical spine surgery  01/2010    There were no vitals filed for this visit.  Visit Diagnosis:  Left shoulder pain  Shoulder stiffness, left      Subjective Assessment - 07/11/15 0850    Subjective 1st day at work yesterday so very sore   Currently in Pain? Yes   Pain Score 5    Pain Location Shoulder   Pain Orientation Left                         [facility] Adult PT Treatment/Exercise - 07/11/15 0001    Shoulder Exercises: Seated   Other Seated Exercises UBE 49fwd/2 back   Shoulder Exercises: Standing   External Rotation Strengthening;Left;10 reps;Theraband  2 sets   Theraband Level (Shoulder External Rotation) Level 2 (Red)   Internal Rotation Strengthening;Left;10 reps;Theraband  2 sets   Theraband Level (Shoulder Internal Rotation) Level 2 (Red)   Other Standing Exercises finger ladder flexion/abd 8 times eccentric lowering   Other Standing Exercises Ball vs wall 5 CC and CW  3# standing cane ther ex 15 times each   Shoulder Exercises: ROM/Strengthening   Cybex Row 1.5 plate;15 reps  2 sets   Other ROM/Strengthening Exercises lat pull 15# 2 sets 10   Electrical Stimulation   Electrical Stimulation Location  Left shoulder   Electrical Stimulation Action IFC   Electrical Stimulation Goals Pain   Manual Therapy   Manual Therapy Passive ROM                  PT Short Term Goals - 05/31/15 0931    PT SHORT TERM GOAL #1   Title independent with initial HEP   Status Achieved           PT Long Term Goals - 06/15/15 1013    PT LONG TERM GOAL #1   Title decrease pain 50%   Status On-going   PT LONG TERM GOAL #2   Title dress and do hair without help   Status On-going   PT LONG TERM GOAL #3   Title increase AROM of the left shoulder flexion to 140 degrees   Status On-going   PT LONG TERM GOAL #4   Title lift 2# to shoulder height shelf   Status On-going   PT LONG TERM GOAL #5   Title increase ER of the left shoulder to 65 degrees   Status On-going               Plan - 07/11/15 0933    Clinical Impression Statement pt with improving strength overall  and staying within limits of prot.   PT Next Visit Plan scapular stabilization, PROM, proprioception, AROM, strength        Problem List Patient Active Problem List   Diagnosis Date Noted  . Maxillary sinusitis, acute 04/20/2015  . Sore throat 04/02/2015  . Screening for malignant neoplasm of the cervix 09/20/2013  . Routine gynecological examination 09/20/2013  . Central abdominal pain 09/08/2013  . S/P cervical discectomy 06/25/2013  . Trapezius muscle spasm 12/11/2012  . Shoulder pain 12/11/2012  . Skin tag 11/19/2012  . Keratosis 11/19/2012  . 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 Cough 03/11/2011  . Anxiety state 08/14/2010  . ELEVATED BP W/O HYPERTENSION 08/04/2009  . ROSACEA 07/25/2009  . MITRAL REGURGITATION 02/18/2009  . GERD 02/18/2009  . LUPUS 02/18/2009  . PALPITATIONS 02/18/2009    Eyleen Rawlinson,ANGIE PTA 07/11/2015, 9:35 AM  [facility]- [place] [address] [place], [place], [address] Phone: [phone]   Fax:  [phone]

## 2015-07-14 ENCOUNTER — Ambulatory Visit: Payer: BC Managed Care – PPO | Admitting: Physical Therapy

## 2015-07-14 DIAGNOSIS — M25512 Pain in left shoulder: Secondary | ICD-10-CM | POA: Diagnosis not present

## 2015-07-14 DIAGNOSIS — M25612 Stiffness of left shoulder, not elsewhere classified: Secondary | ICD-10-CM

## 2015-07-14 NOTE — Therapy (Signed)
[facility] [place] [address] [place], [place], [address] Phone: [phone]   Fax:  [phone]  Physical Therapy Treatment  Patient Details  Name: Lynn Campos MRN: [id number] Date of Birth: 06/03/1971 Referring Provider:  Beverely Low, MD  Encounter Date: 07/14/2015      PT End of Session - 07/14/15 1025    Visit Number 13   Date for PT Re-Evaluation 07/25/15   PT Start Time 0932   PT Stop Time 1020   PT Time Calculation (min) 48 min      Past Medical History  Diagnosis Date  . Mitral regurgitation   . Dyspnea   . GERD (gastroesophageal reflux disease)   . Lupus   . Palpitations   . Asthma   . Rosacea     Past Surgical History  Procedure Laterality Date  . Appendectomy    . Cholecystectomy  05/2010  . Cervical spine surgery  01/2010    There were no vitals filed for this visit.  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 Yes   Pain Score 5    Pain Location Shoulder   Pain Orientation Left                         [facility] Adult PT Treatment/Exercise - 07/14/15 0001    Shoulder Exercises: Standing   Other Standing Exercises AAROM flex,,abd,IR, ER 2 set s10   end range stretch by PTA   Other Standing Exercises stdning 2# behind back pass   Shoulder Exercises: ROM/Strengthening   UBE (Upper Arm Bike) 50fwd/3 back L 2   Cybex Press 10 reps  2 sets 5# with serratus   Cybex Row 15 reps  2 sets 15#   Other ROM/Strengthening Exercises pulleys 5# ext,retratction, IR ,ER   Other ROM/Strengthening Exercises lat pull 15# 2 sets 15  2 sets 10   Electrical Stimulation   Electrical Stimulation Location left shld   Electrical Stimulation Action TENS   Electrical Stimulation Goals Pain   Manual Therapy   Manual Therapy Passive ROM                PT  Education - 07/14/15 1025    Education provided Yes   Education Details TENS use, application and safety   Person(s) Educated Patient   Methods Explanation;Demonstration   Comprehension Verbalized understanding;Returned demonstration          PT Short Term Goals - 05/31/15 0931    PT SHORT TERM GOAL #1   Title independent with initial HEP   Status Achieved           PT Long Term Goals - 06/15/15 1013    PT LONG TERM GOAL #1   Title decrease pain 50%   Status On-going   PT LONG TERM GOAL #2   Title dress and do hair without help   Status On-going   PT LONG TERM GOAL #3   Title increase AROM of the left shoulder flexion to 140 degrees   Status On-going   PT LONG TERM GOAL #4   Title lift 2# to shoulder height shelf   Status On-going   PT LONG TERM GOAL #5   Title increase ER of the left shoulder to 65 degrees   Status On-going  Plan - 07/14/15 1025    Clinical Impression Statement pt with improved ROM but limited with resistance, instructed in TENS for home pain relief   PT Next Visit Plan scapular stabilization, PROM, proprioception, AROM, strength        Problem List Patient Active Problem List   Diagnosis Date Noted  . Maxillary sinusitis, acute 04/20/2015  . Sore throat 04/02/2015  . Screening for malignant neoplasm of the cervix 09/20/2013  . Routine gynecological examination 09/20/2013  . Central abdominal pain 09/08/2013  . S/P cervical discectomy 06/25/2013  . Trapezius muscle spasm 12/11/2012  . Shoulder pain 12/11/2012  . Skin tag 11/19/2012  . Keratosis 11/19/2012  . 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 Cough 03/11/2011  . Anxiety state 08/14/2010  . ELEVATED BP W/O HYPERTENSION 08/04/2009  . ROSACEA 07/25/2009  . MITRAL REGURGITATION 02/18/2009  . GERD 02/18/2009  . LUPUS 02/18/2009  . PALPITATIONS 02/18/2009    Ridhaan Dreibelbis,ANGIE PTA 07/14/2015, 10:28 AM  [facility] [place] [address] [place], [place], [address] Phone: [phone]   Fax:  [phone]

## 2015-07-19 ENCOUNTER — Ambulatory Visit: Payer: BC Managed Care – PPO | Admitting: Physical Therapy

## 2015-07-19 ENCOUNTER — Encounter: Payer: Self-pay | Admitting: Physical Therapy

## 2015-07-19 DIAGNOSIS — M25512 Pain in left shoulder: Secondary | ICD-10-CM | POA: Diagnosis not present

## 2015-07-19 DIAGNOSIS — M25612 Stiffness of left shoulder, not elsewhere classified: Secondary | ICD-10-CM

## 2015-07-19 NOTE — Therapy (Signed)
[facility] [place] [address] [place], [place], [address] Phone: [phone]   Fax:  [phone]  Physical Therapy Treatment  Patient Details  Name: Lynn Campos MRN: [id number] Date of Birth: [birth year] Referring Provider:  Beverely Low, MD  Encounter Date: 07/19/2015      PT End of Session - 07/19/15 1745    Visit Number 14   Date for PT Re-Evaluation 07/25/15   PT Start Time 1652   PT Stop Time 1745   PT Time Calculation (min) 53 min   Activity Tolerance Patient tolerated treatment well   Behavior During Therapy [facility] for tasks assessed/performed      Past Medical History  Diagnosis Date  . Mitral regurgitation   . Dyspnea   . GERD (gastroesophageal reflux disease)   . Lupus   . Palpitations   . Asthma   . Rosacea     Past Surgical History  Procedure Laterality Date  . Appendectomy    . Cholecystectomy  05/2010  . Cervical spine surgery  01/2010    There were no vitals filed for this visit.  Visit Diagnosis:  Left shoulder pain  Shoulder stiffness, left      Subjective Assessment - 07/19/15 1654    Subjective Work is still getting me sore.   Currently in Pain? Yes   Pain Score 2    Pain Location Shoulder   Pain Orientation Left   Pain Descriptors / Indicators Aching   Pain Type Surgical pain            [facility] PT Assessment - 07/19/15 0001    AROM   Left Shoulder Flexion 115 Degrees   Left Shoulder Internal Rotation 40 Degrees  in abduction with assist for abduction   Left Shoulder External Rotation 55 Degrees  in abduction with assist to abduction   PROM   Left Shoulder Flexion 145 Degrees   Left Shoulder Internal Rotation 50 Degrees   Left Shoulder External Rotation 60 Degrees                     [facility] Adult PT Treatment/Exercise - 07/19/15 0001    Shoulder Exercises: Supine   Protraction Strengthening;20 reps;Weights   Protraction Weight (lbs) 3#   External Rotation  Strengthening;15 reps   Theraband Level (Shoulder External Rotation) Other (comment)  1#   Other Supine Exercises AROM to end ranges with over pressures   Shoulder Exercises: Seated   Other Seated Exercises UBE level 4 x 4 minutes   Shoulder Exercises: Prone   Other Prone Exercises yellow tband ER, red tband IR and scapular retraction   Other Prone Exercises 2# body circles for IR   Shoulder Exercises: Standing   External Rotation Strengthening;Left;10 reps;Theraband   Theraband Level (Shoulder External Rotation) Level 2 (Red)   Internal Rotation Strengthening;Left;10 reps;Theraband   Theraband Level (Shoulder Internal Rotation) Level 2 (Red)   Other Standing Exercises stdning 2# behind back pass   Shoulder Exercises: ROM/Strengthening   Other ROM/Strengthening Exercises seated row 20#, lat pulls 20#   Manual Therapy   Manual Therapy Passive ROM   Passive ROM PROM for flexion, ER and IR, manual joint distraction and occilation                  PT Short Term Goals - 05/31/15 0931    PT SHORT TERM GOAL #1   Title independent with initial HEP   Status Achieved  PT Long Term Goals - 06/15/15 1013    PT LONG TERM GOAL #1   Title decrease pain 50%   Status On-going   PT LONG TERM GOAL #2   Title dress and do hair without help   Status On-going   PT LONG TERM GOAL #3   Title increase AROM of the left shoulder flexion to 140 degrees   Status On-going   PT LONG TERM GOAL #4   Title lift 2# to shoulder height shelf   Status On-going   PT LONG TERM GOAL #5   Title increase ER of the left shoulder to 65 degrees   Status On-going               Plan - 07/19/15 1745    Clinical Impression Statement Started more AROM and strength today, measured for first time AROM in standing, good motions but is very tight for ER/IR, weak above 90 degrees   PT Next Visit Plan scapular stabilization, PROM, proprioception, AROM, strength, will need MD note next week    Consulted and Agree with Plan of Care Patient        Problem List Patient Active Problem List   Diagnosis Date Noted  . Maxillary sinusitis, acute 04/20/2015  . Sore throat 04/02/2015  . Screening for malignant neoplasm of the cervix 09/20/2013  . Routine gynecological examination 09/20/2013  . Central abdominal pain 09/08/2013  . S/P cervical discectomy 06/25/2013  . Trapezius muscle spasm 12/11/2012  . Shoulder pain 12/11/2012  . Skin tag 11/19/2012  . Keratosis 11/19/2012  . 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 Cough 03/11/2011  . Anxiety state 08/14/2010  . ELEVATED BP W/O HYPERTENSION 08/04/2009  . ROSACEA 07/25/2009  . MITRAL REGURGITATION 02/18/2009  . GERD 02/18/2009  . LUPUS 02/18/2009  . PALPITATIONS 02/18/2009    Jearld Lesch., PT 07/19/2015, 5:47 PM  [facility] [place] [address] [place], [place], [address] Phone: [phone]   Fax:  [phone]

## 2015-07-21 ENCOUNTER — Encounter: Payer: Self-pay | Admitting: Physical Therapy

## 2015-07-21 ENCOUNTER — Ambulatory Visit: Payer: BC Managed Care – PPO | Admitting: Physical Therapy

## 2015-07-21 DIAGNOSIS — M25512 Pain in left shoulder: Secondary | ICD-10-CM

## 2015-07-21 DIAGNOSIS — M25612 Stiffness of left shoulder, not elsewhere classified: Secondary | ICD-10-CM

## 2015-07-21 NOTE — Therapy (Signed)
[facility] [place] [address] [place], [place], [address] Phone: [phone]   Fax:  [phone]  Physical Therapy Treatment  Patient Details  Name: Lynn Campos MRN: [id number] Date of Birth: 1971/11/25 Referring Provider:  Beverely Low, MD  Encounter Date: 07/21/2015      PT End of Session - 07/21/15 0928    Visit Number 15   Date for PT Re-Evaluation 07/25/15   PT Start Time 0845   PT Stop Time 0930   PT Time Calculation (min) 45 min      Past Medical History  Diagnosis Date  . Mitral regurgitation   . Dyspnea   . GERD (gastroesophageal reflux disease)   . Lupus   . Palpitations   . Asthma   . Rosacea     Past Surgical History  Procedure Laterality Date  . Appendectomy    . Cholecystectomy  05/2010  . Cervical spine surgery  01/2010    There were no vitals filed for this visit.  Visit Diagnosis:  Left shoulder pain  Shoulder stiffness, left      Subjective Assessment - 07/21/15 0847    Subjective definately getting stronger, working is killing me. MD SEPT 1st   Currently in Pain? Yes   Pain Score 3    Pain Location Shoulder                         [facility] Adult PT Treatment/Exercise - 07/21/15 0001    Shoulder Exercises: Standing   Flexion AAROM;Right;Left;20 reps  wall   Other Standing Exercises 1# shld flex,abd and chest press   Other Standing Exercises IR/ER red   Shoulder Exercises: ROM/Strengthening   UBE (Upper Arm Bike) 45fwd/3 back L 2   Cybex Press 10 reps  2 sets 5# with serratus   Cybex Row 15 reps  2 sets 15#   Other ROM/Strengthening Exercises lat pull down 15 # 2 sets 10 for ROM   Manual Therapy   Manual Therapy Passive ROM   Passive ROM PROM for flexion, ER and IR, manual joint distraction and occilation                  PT Short Term Goals - 05/31/15 0931    PT SHORT TERM GOAL #1   Title independent with initial HEP   Status Achieved            PT Long Term Goals - 06/15/15 1013    PT LONG TERM GOAL #1   Title decrease pain 50%   Status On-going   PT LONG TERM GOAL #2   Title dress and do hair without help   Status On-going   PT LONG TERM GOAL #3   Title increase AROM of the left shoulder flexion to 140 degrees   Status On-going   PT LONG TERM GOAL #4   Title lift 2# to shoulder height shelf   Status On-going   PT LONG TERM GOAL #5   Title increase ER of the left shoulder to 65 degrees   Status On-going               Plan - 07/21/15 0928    Clinical Impression Statement pt with inproved strength and func. fatigues easily and most pain with abd so go to tolerance only   PT Next Visit Plan MD Sept 1        Problem List Patient Active Problem List  Diagnosis Date Noted  . Maxillary sinusitis, acute 04/20/2015  . Sore throat 04/02/2015  . Screening for malignant neoplasm of the cervix 09/20/2013  . Routine gynecological examination 09/20/2013  . Central abdominal pain 09/08/2013  . S/P cervical discectomy 06/25/2013  . Trapezius muscle spasm 12/11/2012  . Shoulder pain 12/11/2012  . Skin tag 11/19/2012  . Keratosis 11/19/2012  . 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 Cough 03/11/2011  . Anxiety state 08/14/2010  . ELEVATED BP W/O HYPERTENSION 08/04/2009  . ROSACEA 07/25/2009  . MITRAL REGURGITATION 02/18/2009  . GERD 02/18/2009  . LUPUS 02/18/2009  . PALPITATIONS 02/18/2009    PAYSEUR,ANGIE PTA 07/21/2015, 9:29 AM  [facility] [place] [address] [place], [place], [address] Phone: [phone]   Fax:  [phone]

## 2015-07-25 ENCOUNTER — Ambulatory Visit: Payer: BC Managed Care – PPO | Admitting: Physical Therapy

## 2015-07-25 ENCOUNTER — Encounter: Payer: Self-pay | Admitting: Physical Therapy

## 2015-07-25 DIAGNOSIS — M25612 Stiffness of left shoulder, not elsewhere classified: Secondary | ICD-10-CM

## 2015-07-25 DIAGNOSIS — M25512 Pain in left shoulder: Secondary | ICD-10-CM

## 2015-07-25 NOTE — Therapy (Signed)
[place], [place], [address] Phone: [phone]   Fax:  [phone]  Physical Therapy Treatment  Patient Details  Name: Lynn Campos MRN: [id number] Date of Birth: 02/20/1971 Referring Provider:  Netta Cedars, MD  Encounter Date: 07/25/2015      PT End of Session - 07/25/15 1518    Visit Number 16   Date for PT Re-Evaluation 07/25/15   PT Start Time 1444   PT Stop Time 1530   PT Time Calculation (min) 46 min   Activity Tolerance Patient limited by lethargy   Behavior During Therapy [facility] for tasks assessed/performed      Past Medical History  Diagnosis Date  . Mitral regurgitation   . Dyspnea   . GERD (gastroesophageal reflux disease)   . Lupus   . Palpitations   . Asthma   . Rosacea     Past Surgical History  Procedure Laterality Date  . Appendectomy    . Cholecystectomy  05/2010  . Cervical spine surgery  01/2010    There were no vitals filed for this visit.  Visit Diagnosis:  Left shoulder pain - Plan: PT plan of care cert/re-cert  Shoulder stiffness, left - Plan: PT plan of care cert/re-cert      Subjective Assessment - 07/25/15 1445    Subjective Reports that she is back to work and shoulder is mostly fatigued.  A litte sore and tired, not bad at all.   Currently in Pain? No/denies            [facility] PT Assessment - 07/25/15 0001    AROM   Left Shoulder Flexion 130 Degrees   Left Shoulder Internal Rotation 45 Degrees   Left Shoulder External Rotation 65 Degrees                     [facility] Adult PT Treatment/Exercise - 07/25/15 0001    Shoulder Exercises: Supine   Other Supine Exercises AROM to end ranges with over pressures   Shoulder Exercises: Prone   Other Prone Exercises yellow tband ER, red tband IR and scapular retraction   Other Prone Exercises 2# body circles for IR   Shoulder Exercises: Standing   External Rotation Strengthening;Left;10  reps;Theraband   Theraband Level (Shoulder External Rotation) Level 2 (Red)   Internal Rotation Strengthening;Left;10 reps;Theraband   Theraband Level (Shoulder Internal Rotation) Level 2 (Red)   Flexion AAROM;Right;Left;20 reps   Shoulder Exercises: ROM/Strengthening   Other ROM/Strengthening Exercises seated row 15#, lat pull down 15 # 2 sets 10 for ROM                  PT Short Term Goals - 05/31/15 0931    PT SHORT TERM GOAL #1   Title independent with initial HEP   Status Achieved           PT Long Term Goals - 07/25/15 1518    PT LONG TERM GOAL #1   Title decrease pain 50%   Status Partially Met   PT LONG TERM GOAL #2   Title dress and do hair without help   Status Partially Met   PT LONG TERM GOAL #3   Title increase AROM of the left shoulder flexion to 140 degrees   Status Partially Met               Plan - 07/25/15 1519    Clinical Impression Statement Great increase in ROM, still tight,  c/o pain and tightness in the anterior/lateral upper arm with activities, still some difficulty doing hair and dressing.  Her AROM in standing is flexion 130, ER 65 and IR 45 degrees   PT Frequency 2x / week   PT Duration 4 weeks   PT Next Visit Plan We will continue to follow MD protocol of no abduction and slowly progress with AROM adding PRE's as tolerated   Consulted and Agree with Plan of Care Patient        Problem List Patient Active Problem List   Diagnosis Date Noted  . Maxillary sinusitis, acute 04/20/2015  . Sore throat 04/02/2015  . Screening for malignant neoplasm of the cervix 09/20/2013  . Routine gynecological examination 09/20/2013  . Central abdominal pain 09/08/2013  . S/P cervical discectomy 06/25/2013  . Trapezius muscle spasm 12/11/2012  . Shoulder pain 12/11/2012  . Skin tag 11/19/2012  . Keratosis 11/19/2012  . 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 Cough 03/11/2011  . Anxiety state 08/14/2010  . ELEVATED BP W/O HYPERTENSION  08/04/2009  . ROSACEA 07/25/2009  . MITRAL REGURGITATION 02/18/2009  . GERD 02/18/2009  . LUPUS 02/18/2009  . PALPITATIONS 02/18/2009    Sumner Boast., PT 07/25/2015, 3:24 PM  [place] [address] [place], [place], [address] Phone: [phone]   Fax:  [phone]

## 2015-07-27 ENCOUNTER — Ambulatory Visit: Payer: BC Managed Care – PPO | Admitting: Physical Therapy

## 2015-08-02 ENCOUNTER — Ambulatory Visit: Payer: BC Managed Care – PPO | Admitting: Physical Therapy

## 2015-08-08 ENCOUNTER — Ambulatory Visit: Payer: BC Managed Care – PPO | Admitting: Physical Therapy

## 2015-08-15 ENCOUNTER — Encounter: Payer: Self-pay | Admitting: Physical Therapy

## 2015-08-15 ENCOUNTER — Ambulatory Visit: Payer: BC Managed Care – PPO | Attending: Orthopedic Surgery | Admitting: Physical Therapy

## 2015-08-15 DIAGNOSIS — M25612 Stiffness of left shoulder, not elsewhere classified: Secondary | ICD-10-CM | POA: Diagnosis present

## 2015-08-15 DIAGNOSIS — M25512 Pain in left shoulder: Secondary | ICD-10-CM | POA: Diagnosis not present

## 2015-08-15 NOTE — Therapy (Signed)
[place], [place], [address] Phone: [phone]   Fax:  [phone]  Physical Therapy Treatment  Patient Details  Name: Lynn Campos MRN: [id number] Date of Birth: 1971/08/11 Referring Provider:  Netta Cedars, MD  Encounter Date: 08/15/2015      PT End of Session - 08/15/15 1641    Visit Number 17   Date for PT Re-Evaluation 08/26/15   PT Start Time 8786   PT Stop Time 1643   PT Time Calculation (min) 58 min   Activity Tolerance Patient limited by pain   Behavior During Therapy [facility] for tasks assessed/performed      Past Medical History  Diagnosis Date  . Mitral regurgitation   . Dyspnea   . GERD (gastroesophageal reflux disease)   . Lupus   . Palpitations   . Asthma   . Rosacea     Past Surgical History  Procedure Laterality Date  . Appendectomy    . Cholecystectomy  05/2010  . Cervical spine surgery  01/2010    There were no vitals filed for this visit.  Visit Diagnosis:  Left shoulder pain  Shoulder stiffness, left      Subjective Assessment - 08/15/15 1548    Subjective I am a lot better.  My most difficult thing is still reaching behind my back to get dressed.   Currently in Pain? Yes   Pain Score 1    Pain Location Shoulder   Pain Orientation Left   Pain Descriptors / Indicators Aching            [facility] PT Assessment - 08/15/15 0001    AROM   Left Shoulder Flexion 139 Degrees   Left Shoulder Internal Rotation 63 Degrees   Left Shoulder External Rotation 72 Degrees                     [facility] Adult PT Treatment/Exercise - 08/15/15 0001    Shoulder Exercises: Seated   Other Seated Exercises UBE level 5 x 6 minutes   Shoulder Exercises: Prone   Other Prone Exercises yellow tband ER, red tband IR and scapular retraction   Other Prone Exercises 2# body circles for IR   Shoulder Exercises: Standing   Other Standing Exercises 1# shld flex,abd and  chest press   Other Standing Exercises wall slides, circles, rhythmic stabilization   Shoulder Exercises: ROM/Strengthening   Other ROM/Strengthening Exercises seated row 15#, lat pull down 15 # 2 sets 10 for ROM   Manual Therapy   Manual Therapy Passive ROM   Joint Mobilization glenohumeral joint    Passive ROM PROM for flexion, ER and IR, manual joint distraction and occilation                  PT Short Term Goals - 05/31/15 0931    PT SHORT TERM GOAL #1   Title independent with initial HEP   Status Achieved           PT Long Term Goals - 08/15/15 1643    PT LONG TERM GOAL #1   Title decrease pain 50%   Status Achieved   PT LONG TERM GOAL #2   Title dress and do hair without help   Status Partially Met               Plan - 08/15/15 1642    Clinical Impression Statement Continues to improve with AROM and PROM, she is very tight  in the anterior shoulder, biggest issue for her is the ability to hold hand overhead to dry hair and for dressing   PT Next Visit Plan Add exercises as tolerated and work on the end ranges of motion   Consulted and Agree with Plan of Care Patient        Problem List Patient Active Problem List   Diagnosis Date Noted  . Maxillary sinusitis, acute 04/20/2015  . Sore throat 04/02/2015  . Screening for malignant neoplasm of the cervix 09/20/2013  . Routine gynecological examination 09/20/2013  . Central abdominal pain 09/08/2013  . S/P cervical discectomy 06/25/2013  . Trapezius muscle spasm 12/11/2012  . Shoulder pain 12/11/2012  . Skin tag 11/19/2012  . Keratosis 11/19/2012  . 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 Cough 03/11/2011  . Anxiety state 08/14/2010  . ELEVATED BP W/O HYPERTENSION 08/04/2009  . ROSACEA 07/25/2009  . MITRAL REGURGITATION 02/18/2009  . GERD 02/18/2009  . LUPUS 02/18/2009  . PALPITATIONS 02/18/2009    Sumner Boast., PT 08/15/2015, 4:45 PM  [address] [place], [place], [address] Phone: [phone]   Fax:  [phone]

## 2015-08-21 ENCOUNTER — Ambulatory Visit: Payer: BC Managed Care – PPO | Admitting: Physical Therapy

## 2015-08-22 ENCOUNTER — Ambulatory Visit: Payer: BC Managed Care – PPO | Admitting: Physical Therapy

## 2015-08-29 ENCOUNTER — Ambulatory Visit: Payer: BC Managed Care – PPO | Admitting: Physical Therapy

## 2015-09-07 ENCOUNTER — Ambulatory Visit: Payer: BC Managed Care – PPO | Attending: Orthopedic Surgery | Admitting: Physical Therapy

## 2015-09-07 ENCOUNTER — Encounter: Payer: Self-pay | Admitting: Physical Therapy

## 2015-09-07 DIAGNOSIS — M25512 Pain in left shoulder: Secondary | ICD-10-CM | POA: Diagnosis not present

## 2015-09-07 DIAGNOSIS — M25612 Stiffness of left shoulder, not elsewhere classified: Secondary | ICD-10-CM

## 2015-09-07 NOTE — Therapy (Addendum)
[place], [place], [address] Phone: [phone]   Fax:  [phone]  Physical Therapy Treatment  Patient Details  Name: Lynn Campos MRN: [id number] Date of Birth: 10/17/1971 Referring Provider:  Netta Cedars, MD  Encounter Date: 09/07/2015      PT End of Session - 09/07/15 1549    Visit Number 18   PT Start Time 1520   PT Stop Time 1600   PT Time Calculation (min) 40 min      Past Medical History  Diagnosis Date  . Mitral regurgitation   . Dyspnea   . GERD (gastroesophageal reflux disease)   . Lupus ([place])   . Palpitations   . Asthma   . Rosacea     Past Surgical History  Procedure Laterality Date  . Appendectomy    . Cholecystectomy  05/2010  . Cervical spine surgery  01/2010    There were no vitals filed for this visit.  Visit Diagnosis:  Left shoulder pain  Shoulder stiffness, left      Subjective Assessment - 09/07/15 1524    Subjective cleaned closet a week again and very sore   Currently in Pain? No/denies            [facility] PT Assessment - 09/07/15 0001    ROM / Strength   AROM / PROM / Strength Strength   AROM   Left Shoulder Flexion 165 Degrees  abd 150   Left Shoulder Internal Rotation 62 Degrees   Left Shoulder External Rotation 90 Degrees   Strength   Overall Strength Comments left shld 4+/5                     [facility] Adult PT Treatment/Exercise - 09/07/15 0001    Shoulder Exercises: Standing   Other Standing Exercises OH func strengthening   Manual Therapy   Manual Therapy Passive ROM;Joint mobilization                PT Education - 09/07/15 1548    Education provided Yes   Education Details reviewed HEP and gym safety and progression when okayed by MD   Person(s) Educated Patient   Methods Explanation;Demonstration   Comprehension Returned demonstration;Verbalized understanding          PT Short Term Goals -  05/31/15 0931    PT SHORT TERM GOAL #1   Title independent with initial HEP   Status Achieved           PT Long Term Goals - 09/07/15 1549    PT LONG TERM GOAL #1   Title decrease pain 50%   Status Achieved   PT LONG TERM GOAL #2   Title dress and do hair without help   Status Achieved   PT LONG TERM GOAL #3   Title increase AROM of the left shoulder flexion to 140 degrees   Status Achieved   PT LONG TERM GOAL #4   Title lift 2# to shoulder height shelf   Status Achieved   PT LONG TERM GOAL #5   Title increase ER of the left shoulder to 65 degrees   Status Achieved   Additional Long Term Goals   Additional Long Term Goals Yes               Plan - 09/07/15 1551    Clinical Impression Statement excellent progress, goals met. Minimal limited weakness and strength   PT Next Visit Plan MD  follow up 10/18 and progress to gym as per MD     PHYSICAL THERAPY DISCHARGE SUMMARY   Plan: Patient agrees to discharge.  Patient goals were met. Patient is being discharged due to meeting the stated rehab goals.  ?????        Problem List Patient Active Problem List   Diagnosis Date Noted  . Maxillary sinusitis, acute 04/20/2015  . Sore throat 04/02/2015  . Screening for malignant neoplasm of the cervix 09/20/2013  . Routine gynecological examination 09/20/2013  . Central abdominal pain 09/08/2013  . S/P cervical discectomy 06/25/2013  . Trapezius muscle spasm 12/11/2012  . Shoulder pain 12/11/2012  . Skin tag 11/19/2012  . Keratosis 11/19/2012  . 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 Cough 03/11/2011  . Anxiety state 08/14/2010  . ELEVATED BP W/O HYPERTENSION 08/04/2009  . ROSACEA 07/25/2009  . MITRAL REGURGITATION 02/18/2009  . GERD 02/18/2009  . LUPUS 02/18/2009  . PALPITATIONS 02/18/2009    Aveon Colquhoun,ANGIE PTA 09/07/2015, 3:52 PM  [place] [address] [place], [place], [address] Phone:  [phone]   Fax:  [phone]

## 2015-09-13 ENCOUNTER — Ambulatory Visit: Payer: BC Managed Care – PPO | Admitting: Physical Therapy

## 2015-10-18 ENCOUNTER — Ambulatory Visit (INDEPENDENT_AMBULATORY_CARE_PROVIDER_SITE_OTHER): Payer: BC Managed Care – PPO | Admitting: Family Medicine

## 2015-10-18 ENCOUNTER — Encounter: Payer: Self-pay | Admitting: Family Medicine

## 2015-10-18 VITALS — BP 128/78 | HR 63 | Temp 98.1°F | Resp 16 | Ht 64.0 in | Wt 184.2 lb

## 2015-10-18 DIAGNOSIS — R1013 Epigastric pain: Secondary | ICD-10-CM | POA: Diagnosis not present

## 2015-10-18 LAB — CBC WITH DIFFERENTIAL/PLATELET
BASOS PCT: 0.3 % (ref 0.0–3.0)
Basophils Absolute: 0 10*3/uL (ref 0.0–0.1)
EOS PCT: 2.1 % (ref 0.0–5.0)
Eosinophils Absolute: 0.1 10*3/uL (ref 0.0–0.7)
HEMATOCRIT: 41 % (ref 36.0–46.0)
HEMOGLOBIN: 13.5 g/dL (ref 12.0–15.0)
LYMPHS PCT: 25.1 % (ref 12.0–46.0)
Lymphs Abs: 1.4 10*3/uL (ref 0.7–4.0)
MCHC: 32.8 g/dL (ref 30.0–36.0)
MCV: 87.7 fl (ref 78.0–100.0)
MONO ABS: 0.3 10*3/uL (ref 0.1–1.0)
MONOS PCT: 6 % (ref 3.0–12.0)
Neutro Abs: 3.6 10*3/uL (ref 1.4–7.7)
Neutrophils Relative %: 66.5 % (ref 43.0–77.0)
Platelets: 271 10*3/uL (ref 150.0–400.0)
RBC: 4.68 Mil/uL (ref 3.87–5.11)
RDW: 13.7 % (ref 11.5–15.5)
WBC: 5.4 10*3/uL (ref 4.0–10.5)

## 2015-10-18 LAB — HEPATIC FUNCTION PANEL
ALBUMIN: 4.1 g/dL (ref 3.5–5.2)
ALT: 12 U/L (ref 0–35)
AST: 14 U/L (ref 0–37)
Alkaline Phosphatase: 45 U/L (ref 39–117)
BILIRUBIN TOTAL: 0.4 mg/dL (ref 0.2–1.2)
Bilirubin, Direct: 0.1 mg/dL (ref 0.0–0.3)
Total Protein: 7.1 g/dL (ref 6.0–8.3)

## 2015-10-18 LAB — BASIC METABOLIC PANEL
BUN: 10 mg/dL (ref 6–23)
CALCIUM: 9 mg/dL (ref 8.4–10.5)
CO2: 24 mEq/L (ref 19–32)
CREATININE: 0.67 mg/dL (ref 0.40–1.20)
Chloride: 106 mEq/L (ref 96–112)
GFR: 100.42 mL/min (ref >60.00)
GLUCOSE: 86 mg/dL (ref 70–99)
Potassium: 3.9 mEq/L (ref 3.5–5.1)
Sodium: 138 mEq/L (ref 135–145)

## 2015-10-18 LAB — AMYLASE: AMYLASE: 32 U/L (ref 27–131)

## 2015-10-18 LAB — LIPASE: Lipase: 12 U/L (ref 11.0–59.0)

## 2015-10-18 MED ORDER — SUCRALFATE 1 G PO TABS: 1.0000 g | ORAL_TABLET | Freq: Three times a day (TID) | ORAL | Status: DC

## 2015-10-18 MED ORDER — ESOMEPRAZOLE MAGNESIUM 40 MG PO CPDR: 40.0000 mg | DELAYED_RELEASE_CAPSULE | Freq: Every day | ORAL | Status: DC

## 2015-10-18 NOTE — Assessment & Plan Note (Signed)
New.  Pt's pain after eating, particularly w/ spicy and acidic foods, consistent w/ gastritis.  Pt admits to poor compliance w/ PPI.  Restart PPI but increase to 40mg  daily.  Start Carafate to allow healing of gastric lining.  Check labs to r/o pancreatitis.  If no improvement w/ carafate and PPI, will do H pylori testing.  Reviewed supportive care and red flags that should prompt return.  Pt expressed understanding and is in agreement w/ plan.

## 2015-10-18 NOTE — Patient Instructions (Signed)
Follow up as needed We'll notify you of your lab results and make any changes if needed Drink plenty of fluids Take the Nexium daily (40mg ) Take the Carafate- prior to each meal and before bed (take before bed x1-2 weeks) Advance diet slowly!  Avoid acidic and spicy foods Call with any questions or concerns If you want to join us at the new [place] office, any scheduled appointments will automatically transfer and we will see you at [address], [place], [place] [id number] (OPENING 11/28/15) Happy Holidays!!!

## 2015-10-18 NOTE — Progress Notes (Signed)
Pre visit review using our clinic review tool, if applicable. No additional management support is needed unless otherwise documented below in the visit note. 

## 2015-10-18 NOTE — Progress Notes (Signed)
   Subjective:    Patient ID: Lynn HootsJami Seelman, female    DOB: [birth year], 44 y.o.   MRN: [id number]  HPI abd pain- pt w/ epigastric pain, 'a lot of diarrhea'.  sxs started 2 weeks ago.  Intermittent low grade fevers.  Pain occurs 15-20 minutes after eating- regardless of food.  Spicy or acidic food is much worse.  1 episode of nausea, no vomiting.  + sick contacts.  Hx of cholecystectomy.  Recent shoulder surgery- taking NSAIDs.  Increased stress at home.   Review of Systems For ROS see HPI     Objective:   Physical Exam  Constitutional: She is oriented to person, place, and time. She appears well-developed and well-nourished. No distress.  HENT:  Head: Normocephalic and atraumatic.  Abdominal: Soft. Bowel sounds are normal. She exhibits no distension and no mass. There is tenderness. There is no rebound (mild epigastric TTP) and no guarding.  Neurological: She is alert and oriented to person, place, and time.  Skin: Skin is warm and dry.  Psychiatric: She has a normal mood and affect. Her behavior is normal. Thought content normal.  Vitals reviewed.         Assessment & Plan:

## 2015-11-22 ENCOUNTER — Other Ambulatory Visit: Payer: Self-pay | Admitting: Family Medicine

## 2015-11-23 NOTE — Telephone Encounter (Signed)
Last OV 10/18/15 Alprazolam last filled 03/31/15 #60 with 1

## 2015-11-23 NOTE — Telephone Encounter (Signed)
Medication filled to pharmacy as requested.   

## 2015-12-05 ENCOUNTER — Encounter: Payer: Self-pay | Admitting: Physician Assistant

## 2015-12-05 ENCOUNTER — Ambulatory Visit (INDEPENDENT_AMBULATORY_CARE_PROVIDER_SITE_OTHER): Payer: BC Managed Care – PPO | Admitting: Physician Assistant

## 2015-12-05 ENCOUNTER — Ambulatory Visit (HOSPITAL_BASED_OUTPATIENT_CLINIC_OR_DEPARTMENT_OTHER)
Admission: RE | Admit: 2015-12-05 | Discharge: 2015-12-05 | Disposition: A | Discharge status: Home / Self Care | Payer: BC Managed Care – PPO | Source: Ambulatory Visit | Attending: Physician Assistant | Admitting: Physician Assistant

## 2015-12-05 VITALS — BP 138/108 | HR 115 | Temp 98.3°F | Ht 64.0 in | Wt 190.0 lb

## 2015-12-05 DIAGNOSIS — J45909 Unspecified asthma, uncomplicated: Secondary | ICD-10-CM | POA: Diagnosis not present

## 2015-12-05 DIAGNOSIS — R509 Fever, unspecified: Secondary | ICD-10-CM | POA: Diagnosis present

## 2015-12-05 DIAGNOSIS — R05 Cough: Secondary | ICD-10-CM | POA: Insufficient documentation

## 2015-12-05 DIAGNOSIS — R0789 Other chest pain: Secondary | ICD-10-CM | POA: Insufficient documentation

## 2015-12-05 DIAGNOSIS — R6889 Other general symptoms and signs: Secondary | ICD-10-CM

## 2015-12-05 LAB — POCT INFLUENZA A/B
Influenza A, POC: NEGATIVE
Influenza B, POC: NEGATIVE

## 2015-12-05 MED ORDER — ALBUTEROL SULFATE HFA 108 (90 BASE) MCG/ACT IN AERS: 2.0000 | INHALATION_SPRAY | RESPIRATORY_TRACT | Status: DC | PRN

## 2015-12-05 MED ORDER — OSELTAMIVIR PHOSPHATE 75 MG PO CAPS: 75.0000 mg | ORAL_CAPSULE | Freq: Two times a day (BID) | ORAL | Status: DC

## 2015-12-05 MED ORDER — BENZONATATE 200 MG PO CAPS: 200.0000 mg | ORAL_CAPSULE | Freq: Three times a day (TID) | ORAL | Status: DC | PRN

## 2015-12-05 NOTE — Progress Notes (Signed)
Pre visit review using our clinic review tool, if applicable. No additional management support is needed unless otherwise documented below in the visit note. 

## 2015-12-05 NOTE — Progress Notes (Signed)
Patient presents to clinic today c/o 1.5 days of fever (101), fatigue, aches and sore throat. Fever maxed at 102 but responded to tylenol and ibuprofen. 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 Has history of asthma not typically requiring any medication but has had to use every 6 hours over the past day. Denies chest pain. Patient did not have the flu shot this year.   Past Medical History  Diagnosis Date  . Mitral regurgitation   . Dyspnea   . GERD (gastroesophageal reflux disease)   . Lupus (HCC)   . Palpitations   . Asthma   . Rosacea     Current Outpatient Prescriptions on File Prior to Visit  Medication Sig Dispense Refill  . ALPRAZolam (XANAX) 0.25 MG tablet TAKE 1 TABLET BY MOUTH TWICE DAILY AS NEEDED 60 tablet 0  . cyclobenzaprine (FLEXERIL) 10 MG tablet TAKE 1 TABLET BY MOUTH DAILY AS NEEDED 30 tablet 1  . EPINEPHrine 0.3 mg/0.3 mL IJ SOAJ injection Inject 0.3 mLs (0.3 mg total) into the muscle once. 1 Device 2  . esomeprazole (NEXIUM) 40 MG capsule Take 1 capsule (40 mg total) by mouth daily. 30 capsule 3  . oxyCODONE-acetaminophen (PERCOCET/ROXICET) 5-325 MG per tablet Take 1 tablet by mouth every 4 (four) hours as needed. 30 tablet 0  . sucralfate (CARAFATE) 1 G tablet Take 1 tablet (1 g total) by mouth 4 (four) times daily -  with meals and at bedtime. 90 tablet 0  . traMADol (ULTRAM) 50 MG tablet TK 1 TO 2 T PO Q 4 TO 6 H  0  . AMETHIA LO 0.1-0.02 & 0.01 MG tablet Reported on 12/05/2015     No current facility-administered medications on file prior to visit.    Allergies  Allergen Reactions  . Flovent [Fluticasone Propionate]     Difficulty breathing. No issues with Qvar Rxed by Dr Shelle Ironlance  . Codeine     REACTION: vomiting    Family History  Problem Relation Age of Onset  . Coronary artery disease Mother   . Coronary artery disease Father   . Hypertension Mother   . Stroke Mother   . Allergies Sister   . Asthma Sister   . Rheum  arthritis Mother   . Rheum arthritis Maternal Grandmother     Social History   Social History  . Marital Status: Married    Spouse Name: N/A  . Number of Children: 2  . Years of Education: N/A   Occupational History  .     [employer]land [employer]. Teacher-- [employer]    Social History Main Topics  . Smoking status: Never Smoker   . Smokeless tobacco: Never Used  . Alcohol Use: No  . Drug Use: No  . Sexual Activity: Not Asked   Other Topics Concern  . None   Social History Narrative   Review of Systems  Constitutional: Positive for fever and malaise/fatigue.  HENT: Positive for ear pain.   Respiratory: Positive for cough and wheezing. Negative for hemoptysis, sputum production and shortness of breath.   Cardiovascular: Negative for chest pain and palpitations.  Neurological: Negative for dizziness, loss of consciousness and headaches.     BP 138/108 mmHg  Pulse 115  Temp(Src) 98.3 F (36.8 C) (Oral)  Ht 5\' 4"  (1.626 m)  Wt 190 lb (86.183 kg)  BMI 32.60 kg/m2  SpO2 98%  LMP 12/03/2015  Physical Exam  Constitutional: She is oriented to person, place, and time  and well-developed, well-nourished, and in no distress.  HENT:  Head: Normocephalic and atraumatic.  Right Ear: External ear normal.  Left Ear: External ear normal.  Nose: Nose normal.  Mouth/Throat: Oropharynx is clear and moist. No oropharyngeal exudate.  TM within normal limits bilaterally.  Eyes: Conjunctivae are normal.  Neck: Neck supple.  Cardiovascular: Regular rhythm, normal heart sounds and intact distal pulses.   Pulmonary/Chest: Effort normal. No respiratory distress. She has wheezes. She has no rales. She exhibits no tenderness.  Lymphadenopathy:    She has no cervical adenopathy.  Neurological: She is alert and oriented to person, place, and time.  Skin: Skin is warm and dry. No rash noted.  Psychiatric: Affect normal.  Vitals reviewed.   Recent Results (from the past 2160 hour(s))    Basic metabolic panel     Status: None   Collection Time: 10/18/15 10:27 AM  Result Value Ref Range   Sodium 138 135 - 145 mEq/L   Potassium 3.9 3.5 - 5.1 mEq/L   Chloride 106 96 - 112 mEq/L   CO2 24 19 - 32 mEq/L   Glucose, Bld 86 70 - 99 mg/dL   BUN 10 6 - 23 mg/dL   Creatinine, Ser 1.61 0.40 - 1.20 mg/dL   Calcium 9.0 8.4 - 09.6 mg/dL   GFR 045.40 >98.11 mL/min  Hepatic function panel     Status: None   Collection Time: 10/18/15 10:27 AM  Result Value Ref Range   Total Bilirubin 0.4 0.2 - 1.2 mg/dL   Bilirubin, Direct 0.1 0.0 - 0.3 mg/dL   Alkaline Phosphatase 45 39 - 117 U/L   AST 14 0 - 37 U/L   ALT 12 0 - 35 U/L   Total Protein 7.1 6.0 - 8.3 g/dL   Albumin 4.1 3.5 - 5.2 g/dL  CBC with Differential/Platelet     Status: None   Collection Time: 10/18/15 10:27 AM  Result Value Ref Range   WBC 5.4 4.0 - 10.5 K/uL   RBC 4.68 3.87 - 5.11 Mil/uL   Hemoglobin 13.5 12.0 - 15.0 g/dL   HCT 91.4 78.2 - 95.6 %   MCV 87.7 78.0 - 100.0 fl   MCHC 32.8 30.0 - 36.0 g/dL   RDW 21.3 08.6 - 57.8 %   Platelets 271.0 150.0 - 400.0 K/uL   Neutrophils Relative % 66.5 43.0 - 77.0 %   Lymphocytes Relative 25.1 12.0 - 46.0 %   Monocytes Relative 6.0 3.0 - 12.0 %   Eosinophils Relative 2.1 0.0 - 5.0 %   Basophils Relative 0.3 0.0 - 3.0 %   Neutro Abs 3.6 1.4 - 7.7 K/uL   Lymphs Abs 1.4 0.7 - 4.0 K/uL   Monocytes Absolute 0.3 0.1 - 1.0 K/uL   Eosinophils Absolute 0.1 0.0 - 0.7 K/uL   Basophils Absolute 0.0 0.0 - 0.1 K/uL  Amylase     Status: None   Collection Time: 10/18/15 10:27 AM  Result Value Ref Range   Amylase 32 27 - 131 U/L  Lipase     Status: None   Collection Time: 10/18/15 10:27 AM  Result Value Ref Range   Lipase 12.0 11.0 - 59.0 U/L    Assessment/Plan: Flu-like symptoms Flu swab is negative. 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 Rx Tessalon for cough. Will check cxr to r/o pneumonia  giving high fever.

## 2015-12-05 NOTE — Assessment & Plan Note (Signed)
Flu swab is negative. 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 Rx Tessalon for cough. Will check cxr to r/o pneumonia giving high fever.

## 2015-12-05 NOTE — Addendum Note (Signed)
Addended by: Verdie ShireBAYNES, ANGELA M on: 12/05/2015 11:57 AM   Modules accepted: Orders

## 2015-12-05 NOTE — Patient Instructions (Signed)
Please go downstairs for chest x-ray. I will call with your results. Stay well hydrated and get plenty of rest. Take the Tamiflu and Tessalon as directed. Tylenol for fever and aches. Use the Albuterol inhaler as directed if needed for chest tightness. Follow-up if symptoms are not resolving.  I recommend you stay at home until fever has resolved.

## 2016-01-18 ENCOUNTER — Other Ambulatory Visit: Payer: Self-pay | Admitting: Family Medicine

## 2016-01-18 NOTE — Telephone Encounter (Signed)
Medication filled to pharmacy as requested.   

## 2016-01-31 ENCOUNTER — Encounter: Payer: Self-pay | Admitting: Family Medicine

## 2016-02-15 ENCOUNTER — Encounter: Payer: Self-pay | Admitting: Family Medicine

## 2016-02-23 ENCOUNTER — Encounter: Payer: BC Managed Care – PPO | Admitting: Family Medicine

## 2016-02-29 ENCOUNTER — Other Ambulatory Visit: Payer: Self-pay | Admitting: Family Medicine

## 2016-02-29 ENCOUNTER — Other Ambulatory Visit: Payer: Self-pay | Admitting: General Practice

## 2016-02-29 NOTE — Telephone Encounter (Signed)
Last ov 10/18/15 Alprazolam last filled 11/23/15 #60 with 0

## 2016-03-01 MED ORDER — ALPRAZOLAM 0.25 MG PO TABS: 0.2500 mg | ORAL_TABLET | Freq: Two times a day (BID) | ORAL | Status: DC | PRN

## 2016-03-01 NOTE — Telephone Encounter (Signed)
Medication filled to pharmacy as requested.   

## 2016-03-05 ENCOUNTER — Telehealth: Payer: Self-pay | Admitting: Family Medicine

## 2016-03-05 NOTE — Telephone Encounter (Signed)
Pt states that [facility] never got her refill for xanax. Pt states that she is going out of town on Thursday and really needs this.

## 2016-03-06 NOTE — Telephone Encounter (Signed)
This medication was called in to the pharmacy provider line again at 8am this morning.

## 2016-03-27 ENCOUNTER — Encounter: Payer: Self-pay | Admitting: Family Medicine

## 2016-05-14 ENCOUNTER — Other Ambulatory Visit (HOSPITAL_COMMUNITY)
Admission: RE | Admit: 2016-05-14 | Discharge: 2016-05-14 | Disposition: A | Discharge status: Home / Self Care | Payer: BC Managed Care – PPO | Source: Ambulatory Visit | Attending: Family Medicine | Admitting: Family Medicine

## 2016-05-14 ENCOUNTER — Encounter: Payer: Self-pay | Admitting: Family Medicine

## 2016-05-14 ENCOUNTER — Other Ambulatory Visit: Payer: Self-pay | Admitting: Family Medicine

## 2016-05-14 ENCOUNTER — Ambulatory Visit (INDEPENDENT_AMBULATORY_CARE_PROVIDER_SITE_OTHER): Payer: BC Managed Care – PPO | Admitting: Family Medicine

## 2016-05-14 VITALS — BP 128/84 | HR 97 | Temp 98.2°F | Resp 16 | Ht 64.0 in | Wt 192.4 lb

## 2016-05-14 DIAGNOSIS — Z124 Encounter for screening for malignant neoplasm of cervix: Secondary | ICD-10-CM | POA: Diagnosis not present

## 2016-05-14 DIAGNOSIS — Z1151 Encounter for screening for human papillomavirus (HPV): Secondary | ICD-10-CM | POA: Insufficient documentation

## 2016-05-14 DIAGNOSIS — Z1231 Encounter for screening mammogram for malignant neoplasm of breast: Secondary | ICD-10-CM

## 2016-05-14 DIAGNOSIS — Z23 Encounter for immunization: Secondary | ICD-10-CM | POA: Diagnosis not present

## 2016-05-14 DIAGNOSIS — Z01419 Encounter for gynecological examination (general) (routine) without abnormal findings: Secondary | ICD-10-CM

## 2016-05-14 LAB — BASIC METABOLIC PANEL
BUN: 11 mg/dL (ref 6–23)
CALCIUM: 9.1 mg/dL (ref 8.4–10.5)
CO2: 29 meq/L (ref 19–32)
CREATININE: 0.75 mg/dL (ref 0.40–1.20)
Chloride: 101 mEq/L (ref 96–112)
GFR: 87.95 mL/min (ref >60.00)
Glucose, Bld: 90 mg/dL (ref 70–99)
Potassium: 4.2 mEq/L (ref 3.5–5.1)
Sodium: 135 mEq/L (ref 135–145)

## 2016-05-14 LAB — LIPID PANEL
CHOL/HDL RATIO: 3
Cholesterol: 187 mg/dL (ref 0–200)
HDL: 54.7 mg/dL (ref >39.00)
LDL Cholesterol: 109 mg/dL — ABNORMAL HIGH (ref 0–99)
NONHDL: 131.91
Triglycerides: 116 mg/dL (ref 0.0–149.0)
VLDL: 23.2 mg/dL (ref 0.0–40.0)

## 2016-05-14 LAB — CBC WITH DIFFERENTIAL/PLATELET
BASOS PCT: 0.3 % (ref 0.0–3.0)
Basophils Absolute: 0 10*3/uL (ref 0.0–0.1)
EOS ABS: 0.1 10*3/uL (ref 0.0–0.7)
Eosinophils Relative: 1.3 % (ref 0.0–5.0)
HEMATOCRIT: 40.9 % (ref 36.0–46.0)
HEMOGLOBIN: 13.6 g/dL (ref 12.0–15.0)
LYMPHS PCT: 14.1 % (ref 12.0–46.0)
Lymphs Abs: 1.1 10*3/uL (ref 0.7–4.0)
MCHC: 33.3 g/dL (ref 30.0–36.0)
MCV: 87.7 fl (ref 78.0–100.0)
Monocytes Absolute: 0.3 10*3/uL (ref 0.1–1.0)
Monocytes Relative: 4.3 % (ref 3.0–12.0)
Neutro Abs: 6.2 10*3/uL (ref 1.4–7.7)
Neutrophils Relative %: 80 % — ABNORMAL HIGH (ref 43.0–77.0)
Platelets: 273 10*3/uL (ref 150.0–400.0)
RBC: 4.66 Mil/uL (ref 3.87–5.11)
RDW: 13.3 % (ref 11.5–15.5)
WBC: 7.8 10*3/uL (ref 4.0–10.5)

## 2016-05-14 LAB — HEPATIC FUNCTION PANEL
ALT: 16 U/L (ref 0–35)
AST: 17 U/L (ref 0–37)
Albumin: 4.2 g/dL (ref 3.5–5.2)
Alkaline Phosphatase: 48 U/L (ref 39–117)
BILIRUBIN TOTAL: 0.5 mg/dL (ref 0.2–1.2)
Bilirubin, Direct: 0.1 mg/dL (ref 0.0–0.3)
Total Protein: 7.1 g/dL (ref 6.0–8.3)

## 2016-05-14 LAB — VITAMIN D 25 HYDROXY (VIT D DEFICIENCY, FRACTURES): VITD: 37.49 ng/mL (ref 30.00–100.00)

## 2016-05-14 LAB — TSH: TSH: 1 u[IU]/mL (ref 0.35–4.50)

## 2016-05-14 MED ORDER — OXYCODONE-ACETAMINOPHEN 5-325 MG PO TABS: 1.0000 | ORAL_TABLET | ORAL | Status: DC | PRN

## 2016-05-14 NOTE — Assessment & Plan Note (Signed)
Pap collected. 

## 2016-05-14 NOTE — Progress Notes (Signed)
Pre visit review using our clinic review tool, if applicable. No additional management support is needed unless otherwise documented below in the visit note. 

## 2016-05-14 NOTE — Patient Instructions (Signed)
Follow up in 1 year or as needed We'll notify you of your lab results and make any changes if needed Continue to work on healthy diet and regular exercise- you look great! If [facility] from [facility], call and schedule your mammo [phone] Call with any questions or concerns Thanks for sticking with us! Have a great summer!!!

## 2016-05-14 NOTE — Addendum Note (Signed)
Addended by: Yvone NeuBRODMERKEL, Bransen Fassnacht L on: 05/14/2016 11:18 AM   Modules accepted: Orders

## 2016-05-14 NOTE — Addendum Note (Signed)
Addended by: Sheliah HatchABORI, Cort Dragoo E on: 05/14/2016 10:58 AM   Modules accepted: Orders

## 2016-05-14 NOTE — Assessment & Plan Note (Signed)
Pt's PE WNL.  Due for mammo- order placed.  Pap collected today.  Tdap updated.  Check labs.  Anticipatory guidance provided.

## 2016-05-14 NOTE — Progress Notes (Signed)
   Subjective:    Patient ID: Marga HootsJami Beery, female    DOB: Oct 30, 1971, 45 y.o.   MRN: [id number]  HPI CPE- due for pap and mammo.  Due for Tdap.  Exercising regularly.    Review of Systems Patient reports no vision/ hearing changes, adenopathy,fever, weight change,  persistant/recurrent hoarseness , swallowing issues, chest pain, palpitations, edema, persistant/recurrent cough, hemoptysis, dyspnea (rest/exertional/paroxysmal nocturnal), gastrointestinal bleeding (melena, rectal bleeding), abdominal pain, significant heartburn, bowel changes, GU symptoms (dysuria, hematuria, incontinence), Gyn symptoms (abnormal  bleeding, pain),  syncope, focal weakness, memory loss, numbness & tingling, skin/hair/nail changes, abnormal bruising or bleeding, anxiety, or depression.     Objective:   Physical Exam  General Appearance:    Alert, cooperative, no distress, appears stated age  Head:    Normocephalic, without obvious abnormality, atraumatic  Eyes:    PERRL, conjunctiva/corneas clear, EOM's intact, fundi    benign, both eyes  Ears:    Normal TM's and external ear canals, both ears  Nose:   Nares normal, septum midline, mucosa normal, no drainage    or sinus tenderness  Throat:   Lips, mucosa, and tongue normal; teeth and gums normal  Neck:   Supple, symmetrical, trachea midline, no adenopathy;    Thyroid: no enlargement/tenderness/nodules  Back:     Symmetric, no curvature, ROM normal, no CVA tenderness  Lungs:     Clear to auscultation bilaterally, respirations unlabored  Chest Wall:    No tenderness or deformity   Heart:    Regular rate and rhythm, S1 and S2 normal, no murmur, rub   or gallop  Breast Exam:    No tenderness, masses, or nipple abnormality  Abdomen:     Soft, non-tender, bowel sounds active all four quadrants,    no masses, no organomegaly  Genitalia:    External genitalia normal, cervix normal in appearance, no CMT, uterus in normal size and position, adnexa w/out mass or  tenderness, mucosa pink and moist, no lesions or discharge present  Rectal:    Normal external appearance  Extremities:   Extremities normal, atraumatic, no cyanosis or edema  Pulses:   2+ and symmetric all extremities  Skin:   Skin color, texture, turgor normal, no rashes or lesions  Lymph nodes:   Cervical, supraclavicular, and axillary nodes normal  Neurologic:   CNII-XII intact, normal strength, sensation and reflexes    throughout          Assessment & Plan:

## 2016-05-16 LAB — CYTOLOGY - PAP

## 2016-05-30 ENCOUNTER — Ambulatory Visit
Admission: RE | Admit: 2016-05-30 | Discharge: 2016-05-30 | Disposition: A | Discharge status: Home / Self Care | Payer: BC Managed Care – PPO | Source: Ambulatory Visit | Attending: Family Medicine | Admitting: Family Medicine

## 2016-05-30 DIAGNOSIS — Z1231 Encounter for screening mammogram for malignant neoplasm of breast: Secondary | ICD-10-CM

## 2016-05-31 ENCOUNTER — Other Ambulatory Visit: Payer: Self-pay | Admitting: General Practice

## 2016-05-31 MED ORDER — ALPRAZOLAM 0.25 MG PO TABS: 0.2500 mg | ORAL_TABLET | Freq: Two times a day (BID) | ORAL | Status: DC | PRN

## 2016-05-31 NOTE — Telephone Encounter (Signed)
Last ov 05/14/16 Alprazolam last filled 03/01/16 #60 with 0

## 2016-06-04 ENCOUNTER — Other Ambulatory Visit: Payer: Self-pay | Admitting: Family Medicine

## 2016-06-04 DIAGNOSIS — R928 Other abnormal and inconclusive findings on diagnostic imaging of breast: Secondary | ICD-10-CM

## 2016-06-06 ENCOUNTER — Ambulatory Visit
Admission: RE | Admit: 2016-06-06 | Discharge: 2016-06-06 | Disposition: A | Discharge status: Home / Self Care | Payer: BC Managed Care – PPO | Source: Ambulatory Visit | Attending: Family Medicine | Admitting: Family Medicine

## 2016-06-06 DIAGNOSIS — R928 Other abnormal and inconclusive findings on diagnostic imaging of breast: Secondary | ICD-10-CM

## 2016-06-10 ENCOUNTER — Other Ambulatory Visit: Payer: BC Managed Care – PPO

## 2016-07-02 ENCOUNTER — Encounter: Payer: Self-pay | Admitting: Family Medicine

## 2016-07-24 ENCOUNTER — Other Ambulatory Visit: Payer: Self-pay | Admitting: General Practice

## 2016-07-24 MED ORDER — ALPRAZOLAM 0.25 MG PO TABS: 0.2500 mg | ORAL_TABLET | Freq: Two times a day (BID) | ORAL | 0 refills | Status: DC | PRN

## 2016-07-24 NOTE — Telephone Encounter (Signed)
Medication filled to pharmacy as requested.   

## 2016-07-24 NOTE — Telephone Encounter (Signed)
Last OV 05/14/16 Alprazolam last filled 05/31/16 #60 with 0

## 2016-08-01 ENCOUNTER — Other Ambulatory Visit: Payer: Self-pay | Admitting: Family Medicine

## 2016-08-12 ENCOUNTER — Ambulatory Visit (INDEPENDENT_AMBULATORY_CARE_PROVIDER_SITE_OTHER): Payer: BC Managed Care – PPO | Admitting: Family Medicine

## 2016-08-12 ENCOUNTER — Encounter: Payer: Self-pay | Admitting: Family Medicine

## 2016-08-12 VITALS — BP 126/82 | HR 86 | Temp 98.1°F | Resp 16 | Ht 64.0 in | Wt 191.0 lb

## 2016-08-12 DIAGNOSIS — M6248 Contracture of muscle, other site: Secondary | ICD-10-CM | POA: Diagnosis not present

## 2016-08-12 DIAGNOSIS — M62838 Other muscle spasm: Secondary | ICD-10-CM

## 2016-08-12 MED ORDER — PREDNISONE 10 MG PO TABS: ORAL_TABLET | ORAL | 0 refills | Status: DC

## 2016-08-12 MED ORDER — DIAZEPAM 5 MG PO TABS: ORAL_TABLET | ORAL | 0 refills | Status: DC

## 2016-08-12 NOTE — Progress Notes (Signed)
   Subjective:    Patient ID: Lynn Campos, female    DOB: Aug 23, 1971, 45 y.o.   MRN: [id number]  HPI Neck pain- R neck pain.  Pt has hx of R trap spasm.  pt has been sleeping in a recliner as her mom passed away last week.  Pt took all the percocet, flexeril, robaxin that she had left over.  Painful to turn head, unable to get comfortable.   Review of Systems For ROS see HPI     Objective:   Physical Exam  Constitutional: She is oriented to person, place, and time. She appears well-developed and well-nourished. No distress.  HENT:  Head: Normocephalic and atraumatic.  Musculoskeletal: She exhibits tenderness (tight R trap spasm, mild bony TTP). She exhibits no edema or deformity.  Neurological: She is alert and oriented to person, place, and time.  Skin: Skin is warm and dry. No erythema.  Psychiatric: She has a normal mood and affect. Her behavior is normal. Thought content normal.  Vitals reviewed.         Assessment & Plan:

## 2016-08-12 NOTE — Patient Instructions (Signed)
Follow up as needed Start the Prednisone as directed- take w/ food Start the Valium nightly for spasm- start w/ 1/2 tab and increase to 1 tab if needed Heating pad as needed for spasm/pain Call with any questions or concerns Hang in there!!!

## 2016-08-12 NOTE — Progress Notes (Signed)
Pre visit review using our clinic review tool, if applicable. No additional management support is needed unless otherwise documented below in the visit note. 

## 2016-08-13 NOTE — Assessment & Plan Note (Signed)
Deteriorated.  Pt w/ severe spasm of R trap.  Start pred taper.  Add Valium for night pain and sleep in setting of grief.  Reviewed supportive care and red flags that should prompt return.  Pt expressed understanding and is in agreement w/ plan.

## 2016-08-15 ENCOUNTER — Encounter: Payer: Self-pay | Admitting: Family Medicine

## 2016-08-15 DIAGNOSIS — M5412 Radiculopathy, cervical region: Secondary | ICD-10-CM

## 2016-08-16 MED ORDER — PREDNISONE 10 MG PO TABS: ORAL_TABLET | ORAL | 0 refills | Status: DC

## 2016-08-23 ENCOUNTER — Other Ambulatory Visit: Payer: Self-pay | Admitting: Family Medicine

## 2016-08-26 NOTE — Telephone Encounter (Signed)
Medication filled to pharmacy as requested.   

## 2016-08-26 NOTE — Telephone Encounter (Signed)
Last OV 08/12/16 Alprazolam last filled 07/24/16 #60 with 0

## 2016-09-09 ENCOUNTER — Encounter: Payer: Self-pay | Admitting: Family Medicine

## 2016-10-24 ENCOUNTER — Other Ambulatory Visit: Payer: Self-pay | Admitting: Family Medicine

## 2016-10-25 NOTE — Telephone Encounter (Signed)
Med phoned in °

## 2016-10-25 NOTE — Telephone Encounter (Signed)
Last OV 08/12/16 Alprazolam last filled 08/26/16 #60 with 0  [facility], low risk

## 2016-11-14 ENCOUNTER — Other Ambulatory Visit: Payer: Self-pay | Admitting: Family Medicine

## 2016-11-14 MED ORDER — OXYCODONE-ACETAMINOPHEN 5-325 MG PO TABS: 1.0000 | ORAL_TABLET | ORAL | 0 refills | Status: DC | PRN

## 2016-11-14 NOTE — Telephone Encounter (Signed)
Med filled and pt notified available at front desk for pick up.  

## 2016-11-14 NOTE — Telephone Encounter (Signed)
Last OV 08/12/16 Oxycodone last filled 05/14/16 #30 with 0

## 2016-11-14 NOTE — Telephone Encounter (Signed)
Patient calling to request refill of percocet.  She states this is prescribed to her for neck problems.  [place] and only has 3 pills remaining.

## 2016-11-27 ENCOUNTER — Encounter: Payer: Self-pay | Admitting: General Practice

## 2016-11-27 ENCOUNTER — Encounter: Payer: Self-pay | Admitting: Family Medicine

## 2016-11-27 ENCOUNTER — Ambulatory Visit (INDEPENDENT_AMBULATORY_CARE_PROVIDER_SITE_OTHER): Payer: BC Managed Care – PPO | Admitting: Family Medicine

## 2016-11-27 VITALS — BP 130/83 | HR 97 | Temp 98.6°F | Resp 16 | Ht 64.0 in | Wt 193.2 lb

## 2016-11-27 DIAGNOSIS — R05 Cough: Secondary | ICD-10-CM | POA: Diagnosis not present

## 2016-11-27 DIAGNOSIS — R059 Cough, unspecified: Secondary | ICD-10-CM

## 2016-11-27 MED ORDER — ALBUTEROL SULFATE HFA 108 (90 BASE) MCG/ACT IN AERS: 2.0000 | INHALATION_SPRAY | RESPIRATORY_TRACT | 12 refills | Status: DC | PRN

## 2016-11-27 MED ORDER — PREDNISONE 10 MG PO TABS: ORAL_TABLET | ORAL | 0 refills | Status: DC

## 2016-11-27 NOTE — Progress Notes (Signed)
   Subjective:    Patient ID: Lynn Campos, female    DOB: 1971/06/30, 46 y.o.   MRN: [id number]  HPI Cough- pt reports sxs have been ongoing since Thanksgiving.  She previously had a sinus infxn but this got better but the cough continues.  Now having chest soreness due to cough.  Using Delsym, finished prescription cough syrup, mucinex w/o relief.  Some relief w/ inhaler.  + chest tightness.   Review of Systems For ROS see HPI     Objective:   Physical Exam  Constitutional: She is oriented to person, place, and time. She appears well-developed and well-nourished. No distress.  HENT:  Head: Normocephalic and atraumatic.  TMs normal bilaterally Mild nasal congestion Throat w/out erythema, edema, or exudate  Eyes: Conjunctivae and EOM are normal. Pupils are equal, round, and reactive to light.  Neck: Normal range of motion. Neck supple.  Cardiovascular: Normal rate, regular rhythm, normal heart sounds and intact distal pulses.   No murmur heard. Pulmonary/Chest: Effort normal and breath sounds normal. No respiratory distress. She has no wheezes.  + hacking cough  Lymphadenopathy:    She has no cervical adenopathy.  Neurological: She is alert and oriented to person, place, and time.  Skin: Skin is warm and dry.  Psychiatric: She has a normal mood and affect. Her behavior is normal. Thought content normal.  Vitals reviewed.         Assessment & Plan:

## 2016-11-27 NOTE — Assessment & Plan Note (Signed)
Pt's current cough is consistent w/ post infectious bronchospasm.  Start pred taper.  Albuterol prn.  Reviewed supportive care and red flags that should prompt return.  Pt expressed understanding and is in agreement w/ plan.

## 2016-11-27 NOTE — Patient Instructions (Signed)
Follow up as needed Start the Prednisone as directed- take w/ food Use the Albuterol inhaler as needed Call and [facility] your [facility] [phone] Call with any questions or concerns Happy New Year!!!

## 2016-11-27 NOTE — Progress Notes (Signed)
Pre visit review using our clinic review tool, if applicable. No additional management support is needed unless otherwise documented below in the visit note. 

## 2016-12-16 ENCOUNTER — Other Ambulatory Visit: Payer: Self-pay | Admitting: Family Medicine

## 2016-12-17 NOTE — Telephone Encounter (Signed)
Medication filled to pharmacy as requested.   

## 2016-12-17 NOTE — Telephone Encounter (Signed)
Last OV 11/27/16 Alprazolam last filled 10/25/16 #60 with 0

## 2017-02-13 ENCOUNTER — Other Ambulatory Visit: Payer: Self-pay | Admitting: Family Medicine

## 2017-02-17 ENCOUNTER — Other Ambulatory Visit: Payer: Self-pay | Admitting: General Practice

## 2017-02-17 MED ORDER — ALPRAZOLAM 0.25 MG PO TABS: 0.2500 mg | ORAL_TABLET | Freq: Two times a day (BID) | ORAL | 1 refills | Status: DC | PRN

## 2017-02-17 NOTE — Telephone Encounter (Signed)
Last OV 11/27/16 (cough) Alprazolam last filled 12/17/16

## 2017-02-17 NOTE — Telephone Encounter (Signed)
Medication filled to pharmacy as requested.   

## 2017-02-19 ENCOUNTER — Other Ambulatory Visit: Payer: Self-pay | Admitting: Family Medicine

## 2017-02-20 MED ORDER — OXYCODONE-ACETAMINOPHEN 5-325 MG PO TABS: 1.0000 | ORAL_TABLET | ORAL | 0 refills | Status: DC | PRN

## 2017-02-20 NOTE — Telephone Encounter (Signed)
Last OV 11/27/16 (cough) Oxycodone last filled 11/14/16 #30 with 0

## 2017-02-20 NOTE — Telephone Encounter (Signed)
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

## 2017-03-27 ENCOUNTER — Other Ambulatory Visit: Payer: Self-pay | Admitting: Family Medicine

## 2017-04-29 ENCOUNTER — Other Ambulatory Visit: Payer: Self-pay | Admitting: Family Medicine

## 2017-05-26 ENCOUNTER — Other Ambulatory Visit: Payer: Self-pay | Admitting: Family Medicine

## 2017-05-27 ENCOUNTER — Encounter: Payer: Self-pay | Admitting: Physician Assistant

## 2017-05-27 ENCOUNTER — Other Ambulatory Visit: Payer: Self-pay | Admitting: Family Medicine

## 2017-05-27 MED ORDER — OXYCODONE-ACETAMINOPHEN 5-325 MG PO TABS: 1.0000 | ORAL_TABLET | ORAL | 0 refills | Status: DC | PRN

## 2017-05-27 NOTE — Telephone Encounter (Signed)
Will allow 10 tablets in PCP absence. She will need UDS at time of pick up. Needs to schedule follow-up with PCP.  Rx printed and placed at front desk.

## 2017-05-27 NOTE — Telephone Encounter (Signed)
Medication filled to pharmacy as requested.   

## 2017-05-27 NOTE — Telephone Encounter (Signed)
Last OV 08/12/16 Oxycodone last filled 02/20/17 #30 with 0   [facility] on file, due for UDS

## 2017-05-27 NOTE — Telephone Encounter (Signed)
Will allow 10 tablets of Xanax in PCP absence.  Further fills to come from Dr. Beverely Lowabori -- seems patient is due for follow-up regarding medications.  Rx printed and to be faxed.

## 2017-05-27 NOTE — Telephone Encounter (Signed)
Last OV 08/12/16 Alprazolam last filled 02/17/17 #60 with 1

## 2017-06-03 ENCOUNTER — Encounter: Payer: Self-pay | Admitting: Family Medicine

## 2017-06-03 MED ORDER — ALPRAZOLAM 0.25 MG PO TABS: 0.2500 mg | ORAL_TABLET | Freq: Two times a day (BID) | ORAL | 0 refills | Status: DC | PRN

## 2017-06-12 ENCOUNTER — Ambulatory Visit (INDEPENDENT_AMBULATORY_CARE_PROVIDER_SITE_OTHER): Payer: BC Managed Care – PPO | Admitting: Family Medicine

## 2017-06-12 ENCOUNTER — Encounter: Payer: Self-pay | Admitting: Family Medicine

## 2017-06-12 VITALS — BP 140/80 | HR 90 | Temp 98.6°F | Resp 16 | Ht 64.0 in | Wt 179.0 lb

## 2017-06-12 DIAGNOSIS — Z0000 Encounter for general adult medical examination without abnormal findings: Secondary | ICD-10-CM | POA: Diagnosis not present

## 2017-06-12 LAB — BASIC METABOLIC PANEL
BUN: 13 mg/dL (ref 6–23)
CALCIUM: 9.9 mg/dL (ref 8.4–10.5)
CO2: 29 mEq/L (ref 19–32)
Chloride: 102 mEq/L (ref 96–112)
Creatinine, Ser: 0.77 mg/dL (ref 0.40–1.20)
GFR: 84.93 mL/min (ref >60.00)
GLUCOSE: 95 mg/dL (ref 70–99)
Potassium: 4.7 mEq/L (ref 3.5–5.1)
SODIUM: 136 meq/L (ref 135–145)

## 2017-06-12 LAB — CBC WITH DIFFERENTIAL/PLATELET
Basophils Absolute: 0 10*3/uL (ref 0.0–0.1)
Basophils Relative: 0.5 % (ref 0.0–3.0)
EOS PCT: 1.2 % (ref 0.0–5.0)
Eosinophils Absolute: 0.1 10*3/uL (ref 0.0–0.7)
HEMATOCRIT: 40.5 % (ref 36.0–46.0)
HEMOGLOBIN: 13.4 g/dL (ref 12.0–15.0)
LYMPHS ABS: 1.2 10*3/uL (ref 0.7–4.0)
Lymphocytes Relative: 16.8 % (ref 12.0–46.0)
MCHC: 33 g/dL (ref 30.0–36.0)
MCV: 90.8 fl (ref 78.0–100.0)
MONOS PCT: 5.7 % (ref 3.0–12.0)
Monocytes Absolute: 0.4 10*3/uL (ref 0.1–1.0)
Neutro Abs: 5.4 10*3/uL (ref 1.4–7.7)
Neutrophils Relative %: 75.8 % (ref 43.0–77.0)
Platelets: 240 10*3/uL (ref 150.0–400.0)
RBC: 4.46 Mil/uL (ref 3.87–5.11)
RDW: 13.5 % (ref 11.5–15.5)
WBC: 7.1 10*3/uL (ref 4.0–10.5)

## 2017-06-12 LAB — HEPATIC FUNCTION PANEL
ALBUMIN: 4.6 g/dL (ref 3.5–5.2)
ALK PHOS: 47 U/L (ref 39–117)
ALT: 12 U/L (ref 0–35)
AST: 16 U/L (ref 0–37)
Bilirubin, Direct: 0.1 mg/dL (ref 0.0–0.3)
Total Bilirubin: 0.6 mg/dL (ref 0.2–1.2)
Total Protein: 6.9 g/dL (ref 6.0–8.3)

## 2017-06-12 LAB — LIPID PANEL
CHOLESTEROL: 216 mg/dL — AB (ref 0–200)
HDL: 61.4 mg/dL (ref >39.00)
LDL CALC: 131 mg/dL — AB (ref 0–99)
NonHDL: 154.48
Total CHOL/HDL Ratio: 4
Triglycerides: 117 mg/dL (ref 0.0–149.0)
VLDL: 23.4 mg/dL (ref 0.0–40.0)

## 2017-06-12 NOTE — Progress Notes (Signed)
Pre visit review using our clinic review tool, if applicable. No additional management support is needed unless otherwise documented below in the visit note. 

## 2017-06-12 NOTE — Patient Instructions (Signed)
Follow up in 1 year or as needed We'll notify you of your lab results and make any changes if needed Continue to work on healthy diet and regular exercise- you look great!!! Call and schedule your mammogram!!! Call with any questions or concerns Have a great summer!!!

## 2017-06-12 NOTE — Assessment & Plan Note (Signed)
Pt's PE WNL.  UTD on pap, Tdap.  Due for mammo- pt plans to schedule.  Applauded weight loss efforts.  Check labs.  Anticipatory guidance provided.

## 2017-06-12 NOTE — Progress Notes (Signed)
   Subjective:    Patient ID: Lynn HootsJami Campos, female    DOB: [birth year], 46 y.o.   MRN: [id number]  HPI CPE- UTD on pap, due for mammo (pt plans to schedule).  UTD on Tdap.  Pt has lost 14 lbs since Jan   Review of Systems Patient reports no vision/ hearing changes, adenopathy,fever, weight change,  persistant/recurrent hoarseness , swallowing issues, chest pain, palpitations, edema, persistant/recurrent cough, hemoptysis, dyspnea (rest/exertional/paroxysmal nocturnal), gastrointestinal bleeding (melena, rectal bleeding), abdominal pain, significant heartburn, bowel changes, GU symptoms (dysuria, hematuria, incontinence), Gyn symptoms (abnormal  bleeding, pain),  syncope, focal weakness, memory loss, numbness & tingling, skin/hair/nail changes, abnormal bruising or bleeding, anxiety, or depression.     Objective:   Physical Exam General Appearance:    Alert, cooperative, no distress, appears stated age  Head:    Normocephalic, without obvious abnormality, atraumatic  Eyes:    PERRL, conjunctiva/corneas clear, EOM's intact, fundi    benign, both eyes  Ears:    Normal TM's and external ear canals, both ears  Nose:   Nares normal, septum midline, mucosa normal, no drainage    or sinus tenderness  Throat:   Lips, mucosa, and tongue normal; teeth and gums normal  Neck:   Supple, symmetrical, trachea midline, no adenopathy;    Thyroid: no enlargement/tenderness/nodules  Back:     Symmetric, no curvature, ROM normal, no CVA tenderness  Lungs:     Clear to auscultation bilaterally, respirations unlabored  Chest Wall:    No tenderness or deformity   Heart:    Regular rate and rhythm, S1 and S2 normal, no murmur, rub   or gallop  Breast Exam:    Deferred to GYN  Abdomen:     Soft, non-tender, bowel sounds active all four quadrants,    no masses, no organomegaly  Genitalia:    Deferred to GYN  Rectal:    Extremities:   Extremities normal, atraumatic, no cyanosis or edema  Pulses:   2+ and symmetric  all extremities  Skin:   Skin color, texture, turgor normal, no rashes or lesions  Lymph nodes:   Cervical, supraclavicular, and axillary nodes normal  Neurologic:   CNII-XII intact, normal strength, sensation and reflexes    throughout          Assessment & Plan:

## 2017-06-13 ENCOUNTER — Encounter: Payer: Self-pay | Admitting: General Practice

## 2017-07-01 ENCOUNTER — Other Ambulatory Visit: Payer: Self-pay | Admitting: Family Medicine

## 2017-07-02 MED ORDER — ALPRAZOLAM 0.25 MG PO TABS: 0.2500 mg | ORAL_TABLET | Freq: Two times a day (BID) | ORAL | 0 refills | Status: DC | PRN

## 2017-07-02 NOTE — Telephone Encounter (Signed)
Ok for refill, needs UDS at next OV

## 2017-07-02 NOTE — Telephone Encounter (Signed)
Last OV 06/12/17 Alprazolam last filled 06/03/17   [facility], Need UDS

## 2017-07-02 NOTE — Telephone Encounter (Signed)
Medication filled to pharmacy as requested.   

## 2017-07-04 ENCOUNTER — Other Ambulatory Visit: Payer: Self-pay | Admitting: Family Medicine

## 2017-07-04 DIAGNOSIS — Z1231 Encounter for screening mammogram for malignant neoplasm of breast: Secondary | ICD-10-CM

## 2017-07-07 ENCOUNTER — Ambulatory Visit: Payer: BC Managed Care – PPO

## 2017-07-14 ENCOUNTER — Ambulatory Visit: Payer: BC Managed Care – PPO

## 2017-07-15 ENCOUNTER — Other Ambulatory Visit: Payer: Self-pay | Admitting: Physician Assistant

## 2017-07-16 MED ORDER — OXYCODONE-ACETAMINOPHEN 5-325 MG PO TABS: 1.0000 | ORAL_TABLET | ORAL | 0 refills | Status: DC | PRN

## 2017-07-16 NOTE — Telephone Encounter (Signed)
Last OV 06/12/17 Oxycodone last filled 05/27/17 #10 with 0  Pt previously received #30   [facility], UDS/moderate risk

## 2017-07-22 ENCOUNTER — Ambulatory Visit
Admission: RE | Admit: 2017-07-22 | Discharge: 2017-07-22 | Disposition: A | Discharge status: Home / Self Care | Payer: BC Managed Care – PPO | Source: Ambulatory Visit | Attending: Family Medicine | Admitting: Family Medicine

## 2017-07-22 DIAGNOSIS — Z1231 Encounter for screening mammogram for malignant neoplasm of breast: Secondary | ICD-10-CM

## 2017-08-05 ENCOUNTER — Other Ambulatory Visit: Payer: Self-pay | Admitting: Family Medicine

## 2017-08-06 MED ORDER — ALPRAZOLAM 0.25 MG PO TABS: 0.2500 mg | ORAL_TABLET | Freq: Two times a day (BID) | ORAL | 0 refills | Status: DC | PRN

## 2017-08-06 NOTE — Telephone Encounter (Signed)
Noted will keep an eye on her refill.

## 2017-08-06 NOTE — Telephone Encounter (Signed)
Will provide refill but she should not be needing a rescue medication daily.  If she is needing it this frequently, we need to talk about a daily controller medication

## 2017-08-06 NOTE — Telephone Encounter (Signed)
Last OV 06/12/17 Alprazolam last filled 07/02/17 #30 with 0, pt is taking nearly one daily as this is a common filling.

## 2017-08-25 ENCOUNTER — Encounter: Payer: Self-pay | Admitting: Family Medicine

## 2017-08-25 ENCOUNTER — Ambulatory Visit (INDEPENDENT_AMBULATORY_CARE_PROVIDER_SITE_OTHER): Payer: BC Managed Care – PPO | Admitting: Family Medicine

## 2017-08-25 ENCOUNTER — Encounter: Payer: Self-pay | Admitting: General Practice

## 2017-08-25 VITALS — BP 130/80 | HR 79 | Temp 98.2°F | Resp 16 | Ht 64.0 in | Wt 182.4 lb

## 2017-08-25 DIAGNOSIS — J029 Acute pharyngitis, unspecified: Secondary | ICD-10-CM

## 2017-08-25 LAB — POCT RAPID STREP A (OFFICE): Rapid Strep A Screen: NEGATIVE

## 2017-08-25 NOTE — Progress Notes (Signed)
   Subjective:    Patient ID: Lynn Campos, female    DOB: 06-May-1971, 46 y.o.   MRN: [id number]  HPI Sore throat- pt vomited Saturday and Sunday which is typically how strep presents for her.  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  + sore throat= painful to swallow.  No fever.  + PND.   Review of Systems For ROS see HPI     Objective:   Physical Exam  Constitutional: She appears well-developed and well-nourished. No distress.  HENT:  Head: Normocephalic and atraumatic.  Right Ear: Tympanic membrane normal.  Left Ear: Tympanic membrane normal.  Nose: Mucosal edema and rhinorrhea present. Right sinus exhibits no maxillary sinus tenderness and no frontal sinus tenderness. Left sinus exhibits no maxillary sinus tenderness and no frontal sinus tenderness.  Mouth/Throat: Mucous membranes are normal. Posterior oropharyngeal erythema (w/ PND) present.  Eyes: Pupils are equal, round, and reactive to light. Conjunctivae and EOM are normal.  Neck: Normal range of motion. Neck supple.  Cardiovascular: Normal rate, regular rhythm and normal heart sounds.   Pulmonary/Chest: Effort normal and breath sounds normal. No respiratory distress. She has no wheezes. She has no rales.  Lymphadenopathy:    She has no cervical adenopathy.  Vitals reviewed.         Assessment & Plan:  Pharyngitis- new.  Pt's rapid strep is negative and throat does not look suspicious for strep infxn.  Suspect her sore throat is due to copious PND and that this drainage also caused her nausea and vomiting.  Start daily antihistamine, nasal steroid, and H2 blocker.  Reviewed supportive care and red flags that should prompt return.  Pt expressed understanding and is in agreement w/ plan.

## 2017-08-25 NOTE — Patient Instructions (Addendum)
Follow up as needed or as scheduled This is not strep- yay!!! Continue to take daily OTC antihistamine (Claritin or Zyrtec) and add Flonase (2 sprays each nostril daily) Drink plenty of fluids to rinse the back of the throat Add OTC Ranitidine (Zantac) at night to decrease the nausea from the drainage Call with any questions or concerns Hang in there!!!

## 2017-08-25 NOTE — Progress Notes (Signed)
Pre visit review using our clinic review tool, if applicable. No additional management support is needed unless otherwise documented below in the visit note. 

## 2017-09-08 ENCOUNTER — Encounter: Payer: Self-pay | Admitting: Family Medicine

## 2017-10-01 ENCOUNTER — Encounter: Payer: Self-pay | Admitting: Family Medicine

## 2017-10-01 ENCOUNTER — Other Ambulatory Visit: Payer: Self-pay | Admitting: Family Medicine

## 2017-10-02 ENCOUNTER — Encounter: Payer: Self-pay | Admitting: General Practice

## 2017-10-02 MED ORDER — OXYCODONE-ACETAMINOPHEN 5-325 MG PO TABS: 1.0000 | ORAL_TABLET | ORAL | 0 refills | Status: DC | PRN

## 2017-10-02 MED ORDER — CYCLOBENZAPRINE HCL 10 MG PO TABS: 10.0000 mg | ORAL_TABLET | Freq: Every day | ORAL | 3 refills | Status: DC | PRN

## 2017-10-02 NOTE — Telephone Encounter (Signed)
Last OV 08/25/17 Oxycodone last filled 07/16/17 #30 with 0 Flexeril last filled 04/30/17 #30 with 3

## 2017-10-02 NOTE — Telephone Encounter (Signed)
Med approved, pt made aware rx available at front desk for pick up.

## 2017-10-06 ENCOUNTER — Ambulatory Visit (INDEPENDENT_AMBULATORY_CARE_PROVIDER_SITE_OTHER): Payer: BC Managed Care – PPO

## 2017-10-06 ENCOUNTER — Encounter: Payer: Self-pay | Admitting: Family Medicine

## 2017-10-06 ENCOUNTER — Ambulatory Visit: Payer: BC Managed Care – PPO | Admitting: Family Medicine

## 2017-10-06 ENCOUNTER — Other Ambulatory Visit: Payer: Self-pay

## 2017-10-06 VITALS — BP 120/80 | HR 86 | Temp 98.1°F | Resp 16 | Ht 64.0 in | Wt 189.4 lb

## 2017-10-06 DIAGNOSIS — M7741 Metatarsalgia, right foot: Secondary | ICD-10-CM

## 2017-10-06 NOTE — Patient Instructions (Signed)
Go to [facility] and get your Xrays Wear the post-op shoe for support and comfort ICE! Alternate ibuprofen and tylenol for pain relief If there is a fracture, we'll refer you to Ortho Call with any questions or concerns Hang in there!!!!

## 2017-10-06 NOTE — Progress Notes (Signed)
   Subjective:    Patient ID: Lynn Campos, female    DOB: July 27, 1971, 46 y.o.   MRN: [id number]  HPI Foot pain- pt kicked a metal pole last week in the parking lot (Wednesday).  Friday was walking down the stairs and developed pain and swelling across her 1st and 2nd metatarsals.  Foot is particularly painful when pushing off.     Review of Systems For ROS see HPI     Objective:   Physical Exam  Constitutional: She is oriented to person, place, and time. She appears well-developed and well-nourished. No distress.  Cardiovascular: Intact distal pulses.  Musculoskeletal: She exhibits edema and tenderness (TTP, swelling and bruising over 1st metatarsal .  Swelling and TTP but no bruising over 2nd metatarsal).  Neurological: She is alert and oriented to person, place, and time.  Skin: Skin is warm and dry. No erythema.  Psychiatric: She has a normal mood and affect. Her behavior is normal. Thought content normal.  Vitals reviewed.         Assessment & Plan:  Metatarsalgia- new.  Pt kicked a metal pole and subsequently developed severe pain and bruising of R 1st and 2nd metatarsals.  Will get xrays.  Pt placed in post-op shoe for comfort and suspected fracture of metatarsal.  If fx present, will refer to ortho.  Pt expressed understanding and is in agreement w/ plan.

## 2017-10-13 ENCOUNTER — Other Ambulatory Visit: Payer: Self-pay | Admitting: Family Medicine

## 2017-10-13 MED ORDER — ALPRAZOLAM 0.25 MG PO TABS: 0.2500 mg | ORAL_TABLET | Freq: Two times a day (BID) | ORAL | 1 refills | Status: DC | PRN

## 2017-10-13 NOTE — Telephone Encounter (Signed)
Last OV 10/06/17 Alprazolam last filled 08/06/17 #30 with 0

## 2017-10-13 NOTE — Telephone Encounter (Signed)
Medication filled to pharmacy as requested.   

## 2017-11-14 ENCOUNTER — Ambulatory Visit: Payer: BC Managed Care – PPO | Admitting: Nurse Practitioner

## 2017-11-14 ENCOUNTER — Encounter: Payer: Self-pay | Admitting: Nurse Practitioner

## 2017-11-14 VITALS — BP 126/84 | HR 98 | Temp 98.3°F | Ht 64.0 in | Wt 186.0 lb

## 2017-11-14 DIAGNOSIS — J0140 Acute pansinusitis, unspecified: Secondary | ICD-10-CM

## 2017-11-14 MED ORDER — AMOXICILLIN-POT CLAVULANATE 875-125 MG PO TABS: 1.0000 | ORAL_TABLET | Freq: Two times a day (BID) | ORAL | 0 refills | Status: DC

## 2017-11-14 MED ORDER — SALINE SPRAY 0.65 % NA SOLN: 1.0000 | NASAL | 0 refills | Status: DC | PRN

## 2017-11-14 MED ORDER — GUAIFENESIN ER 600 MG PO TB12: 600.0000 mg | ORAL_TABLET | Freq: Two times a day (BID) | ORAL | 0 refills | Status: DC | PRN

## 2017-11-14 MED ORDER — FLUTICASONE PROPIONATE 50 MCG/ACT NA SUSP: 2.0000 | Freq: Every day | NASAL | 0 refills | Status: DC

## 2017-11-14 NOTE — Patient Instructions (Signed)
URI Instructions: Flonase and Afrin use: apply 1spray of afrin in each nare, wait 5mins, then apply 2sprays of flonase in each nare. Use both nasal spray consecutively x 3days, then flonase only for at least 14days.  Encourage adequate oral hydration. 

## 2017-11-14 NOTE — Progress Notes (Signed)
Subjective:  Patient ID: Lynn Campos, female    DOB: October 07, 1971  Age: 46 y.o. MRN: [id number]  CC: Cough (fever,coughing,sneezing,sinus pressur,cant sleep/ going on for 1 mo)   Sinusitis  This is a new problem. The current episode started 1 to 4 weeks ago. The problem has been waxing and waning since onset. The maximum temperature recorded prior to her arrival was 100.4 - 100.9 F. Associated symptoms include chills, congestion, coughing, ear pain, headaches and sinus pressure. Pertinent negatives include no swollen glands. Past treatments include oral decongestants and saline sprays. The treatment provided no relief.   Waxing and waning symptoms for 1months Fever last night 100. Yellow and green sinus drainage.  Outpatient Medications Prior to Visit  Medication Sig Dispense Refill  . albuterol (PROVENTIL HFA) 108 (90 Base) MCG/ACT inhaler Inhale 2 puffs into the lungs every 4 (four) hours as needed for wheezing. 1 Inhaler 12  . ALPRAZolam (XANAX) 0.25 MG tablet Take 1 tablet (0.25 mg total) 2 (two) times daily as needed by mouth. 30 tablet 1  . cyclobenzaprine (FLEXERIL) 10 MG tablet Take 1 tablet (10 mg total) daily as needed by mouth. 30 tablet 3  . EPINEPHrine 0.3 mg/0.3 mL IJ SOAJ injection Inject 0.3 mLs (0.3 mg total) into the muscle once. 1 Device 2  . oxyCODONE-acetaminophen (PERCOCET/ROXICET) 5-325 MG tablet Take 1 tablet every 4 (four) hours as needed by mouth. 30 tablet 0   No facility-administered medications prior to visit.     ROS See HPI  Objective:  BP 126/84   Pulse 98   Temp 98.3 F (36.8 C) (Oral)   Ht 5\' 4"  (1.626 m)   Wt 186 lb (84.4 kg)   SpO2 98%   BMI 31.93 kg/m   BP Readings from Last 3 Encounters:  11/14/17 126/84  10/06/17 120/80  08/25/17 130/80    Wt Readings from Last 3 Encounters:  11/14/17 186 lb (84.4 kg)  10/06/17 189 lb 6 oz (85.9 kg)  08/25/17 182 lb 6 oz (82.7 kg)    Physical Exam  Constitutional: She is oriented to person,  place, and time.  HENT:  Right Ear: Tympanic membrane, external ear and ear canal normal.  Left Ear: Tympanic membrane, external ear and ear canal normal.  Nose: Mucosal edema and rhinorrhea present. Right sinus exhibits maxillary sinus tenderness. Right sinus exhibits no frontal sinus tenderness. Left sinus exhibits maxillary sinus tenderness. Left sinus exhibits no frontal sinus tenderness.  Mouth/Throat: Uvula is midline. No trismus in the jaw. Posterior oropharyngeal erythema present. No oropharyngeal exudate.  Eyes: No scleral icterus.  Neck: Normal range of motion. Neck supple.  Cardiovascular: Normal rate and normal heart sounds.  Pulmonary/Chest: Effort normal and breath sounds normal.  Musculoskeletal: She exhibits no edema.  Lymphadenopathy:    She has no cervical adenopathy.  Neurological: She is alert and oriented to person, place, and time.  Vitals reviewed.   Lab Results  Component Value Date   WBC 7.1 06/12/2017   HGB 13.4 06/12/2017   HCT 40.5 06/12/2017   PLT 240.0 06/12/2017   GLUCOSE 95 06/12/2017   CHOL 216 (H) 06/12/2017   TRIG 117.0 06/12/2017   HDL 61.40 06/12/2017   LDLDIRECT 137.3 09/11/2010   LDLCALC 131 (H) 06/12/2017   ALT 12 06/12/2017   AST 16 06/12/2017   NA 136 06/12/2017   K 4.7 06/12/2017   CL 102 06/12/2017   CREATININE 0.77 06/12/2017   BUN 13 06/12/2017   CO2 29 06/12/2017   TSH  1.00 05/14/2016   INR 1.0 04/07/2009   HGBA1C 5.7 09/22/2013    Mm Digital Screening Bilateral  Result Date: 07/23/2017 CLINICAL DATA:  Screening. EXAM: DIGITAL SCREENING BILATERAL MAMMOGRAM WITH CAD COMPARISON:  Previous exam(s). ACR Breast Density Category b: There are scattered areas of fibroglandular density. FINDINGS: There are no findings suspicious for malignancy. Images were processed with CAD. IMPRESSION: No mammographic evidence of malignancy. A result letter of this screening mammogram will be mailed directly to the patient. RECOMMENDATION: Screening  mammogram in one year. (Code:SM-B-01Y) BI-RADS CATEGORY  1: Negative. Electronically Signed   By: Sande BrothersSerena  Chacko M.D.   On: 07/23/2017 10:03    Assessment & Plan:   Lynn Campos was seen today for cough.  Diagnoses and all orders for this visit:  Acute non-recurrent pansinusitis -     guaiFENesin (MUCINEX) 600 MG 12 hr tablet; Take 1 tablet (600 mg total) by mouth 2 (two) times daily as needed for cough or to loosen phlegm. -     fluticasone (FLONASE) 50 MCG/ACT nasal spray; Place 2 sprays into both nostrils daily. -     amoxicillin-clavulanate (AUGMENTIN) 875-125 MG tablet; Take 1 tablet by mouth 2 (two) times daily. -     sodium chloride (OCEAN) 0.65 % SOLN nasal spray; Place 1 spray into both nostrils as needed for congestion.   I am having [name] start on guaiFENesin, fluticasone, amoxicillin-clavulanate, and sodium chloride. I am also having her maintain her EPINEPHrine, albuterol, oxyCODONE-acetaminophen, cyclobenzaprine, and ALPRAZolam.  Meds ordered this encounter  Medications  . guaiFENesin (MUCINEX) 600 MG 12 hr tablet    Sig: Take 1 tablet (600 mg total) by mouth 2 (two) times daily as needed for cough or to loosen phlegm.    Dispense:  14 tablet    Refill:  0    Order Specific Question:   Supervising Provider    Answer:   Dianne DunARON, TALIA M [[id number]]  . fluticasone (FLONASE) 50 MCG/ACT nasal spray    Sig: Place 2 sprays into both nostrils daily.    Dispense:  16 g    Refill:  0    Order Specific Question:   Supervising Provider    Answer:   Dianne DunARON, TALIA M [[id number]]  . amoxicillin-clavulanate (AUGMENTIN) 875-125 MG tablet    Sig: Take 1 tablet by mouth 2 (two) times daily.    Dispense:  20 tablet    Refill:  0    Order Specific Question:   Supervising Provider    Answer:   Dianne DunARON, TALIA M [[id number]]  . sodium chloride (OCEAN) 0.65 % SOLN nasal spray    Sig: Place 1 spray into both nostrils as needed for congestion.    Dispense:  15 mL    Refill:  0    Order Specific Question:    Supervising Provider    Answer:   Dianne DunARON, TALIA M [[id number]]    Follow-up: Return if symptoms worsen or fail to improve.  Alysia Pennaharlotte Rickelle Sylvestre, NP

## 2017-12-11 ENCOUNTER — Other Ambulatory Visit: Payer: Self-pay | Admitting: Family Medicine

## 2017-12-11 MED ORDER — OXYCODONE-ACETAMINOPHEN 5-325 MG PO TABS: 1.0000 | ORAL_TABLET | ORAL | 0 refills | Status: DC | PRN

## 2017-12-11 NOTE — Telephone Encounter (Signed)
Last OV 10/06/17 Oxycodone last filled 10/02/17 #30 with 0

## 2017-12-11 NOTE — Telephone Encounter (Signed)
Medication filled to pharmacy as requested.   

## 2018-01-04 ENCOUNTER — Other Ambulatory Visit: Payer: Self-pay | Admitting: Family Medicine

## 2018-01-05 NOTE — Telephone Encounter (Signed)
Last OV 10/06/17 Alprazolam last filled 10/13/17 #30 with 1

## 2018-01-07 ENCOUNTER — Encounter: Payer: Self-pay | Admitting: General Practice

## 2018-01-31 ENCOUNTER — Other Ambulatory Visit: Payer: Self-pay | Admitting: Family Medicine

## 2018-02-02 MED ORDER — OXYCODONE-ACETAMINOPHEN 5-325 MG PO TABS: 1.0000 | ORAL_TABLET | ORAL | 0 refills | Status: DC | PRN

## 2018-02-02 NOTE — Telephone Encounter (Signed)
Last OV: 10/06/17 Last Filled: 12/11/17 30 with 0

## 2018-02-02 NOTE — Telephone Encounter (Signed)
Indication for chronic opioid: back pain Medication and dose: Percocet 5/325mg  # pills per month: <30 Last UDS date: July 2018 Pain contract signed (Y/N): Y Date narcotic database last reviewed (include red flags): Yes- no red flags

## 2018-02-19 ENCOUNTER — Other Ambulatory Visit: Payer: Self-pay | Admitting: Family Medicine

## 2018-02-20 NOTE — Telephone Encounter (Signed)
Last OV 11/14/17 Alprazolam last filled 01/05/18 #30 with 0

## 2018-03-19 ENCOUNTER — Other Ambulatory Visit: Payer: Self-pay | Admitting: Family Medicine

## 2018-03-19 MED ORDER — OXYCODONE-ACETAMINOPHEN 5-325 MG PO TABS: 1.0000 | ORAL_TABLET | ORAL | 0 refills | Status: DC | PRN

## 2018-03-19 NOTE — Telephone Encounter (Signed)
Last OV 10/06/17, No Future OV  Last filled 02/02/18, # 30 with 0 refills

## 2018-03-19 NOTE — Telephone Encounter (Signed)
Note    Indication for chronic opioid: back pain Medication and dose: Percocet 5/325mg  # pills per month: <30 Last UDS date: July 2018 Pain contract signed (Y/N): Y Date narcotic database last reviewed (include red flags): Yes- no red flags

## 2018-04-09 ENCOUNTER — Other Ambulatory Visit: Payer: Self-pay | Admitting: Physician Assistant

## 2018-04-10 ENCOUNTER — Encounter: Payer: Self-pay | Admitting: Family Medicine

## 2018-04-10 MED ORDER — ALPRAZOLAM 0.25 MG PO TABS: 0.2500 mg | ORAL_TABLET | Freq: Two times a day (BID) | ORAL | 0 refills | Status: DC | PRN

## 2018-04-10 MED ORDER — CYCLOBENZAPRINE HCL 10 MG PO TABS: 10.0000 mg | ORAL_TABLET | Freq: Every day | ORAL | 3 refills | Status: DC | PRN

## 2018-04-10 NOTE — Telephone Encounter (Signed)
Last OV 11/14/17 Alprazolam last filled 02/20/18 #30 with 0 Cyclobenzaprine last filled 10/02/17 #30 with 3

## 2018-04-10 NOTE — Telephone Encounter (Signed)
Last OV 10/06/17, No future OV  Last filled 02/20/18, # 30 with 0 refills

## 2018-04-13 ENCOUNTER — Encounter: Payer: Self-pay | Admitting: Family Medicine

## 2018-04-13 ENCOUNTER — Ambulatory Visit: Payer: BC Managed Care – PPO | Admitting: Family Medicine

## 2018-04-13 ENCOUNTER — Other Ambulatory Visit: Payer: Self-pay

## 2018-04-13 VITALS — BP 123/82 | HR 64 | Temp 98.6°F | Resp 16 | Ht 64.0 in | Wt 189.0 lb

## 2018-04-13 DIAGNOSIS — M542 Cervicalgia: Secondary | ICD-10-CM | POA: Diagnosis not present

## 2018-04-13 MED ORDER — PREDNISONE 10 MG PO TABS: ORAL_TABLET | ORAL | 0 refills | Status: DC

## 2018-04-13 NOTE — Progress Notes (Signed)
   Subjective:    Patient ID: Lynn Campos, female    DOB: Jul 06, 1971, 47 y.o.   MRN: [id number]  HPI  Neck Pain- 'it's just unbearable'.  3 weeks ago noticed 'nerve pain' radiating into L arm.  Pain starts in R occiput and radiates down R side of neck and into shoulder.  Pt has tried heat, ice w/o relief.  No relief w/ flexeril, ibuprofen, or Oxycodone.  Pt has 4 hr test tomorrow.  Pt wants to see Dr Ethelene Hal for injxns- has not seen him since 2011.  Pt has hx of discectomy and has seen Dr Yetta Barre in the past.  He has told her that she will require additional surgery but this is a last resort for her.   Review of Systems For ROS see HPI     Objective:   Physical Exam  Constitutional: She appears well-developed and well-nourished. No distress.  HENT:  Head: Normocephalic and atraumatic.  Neck:  Full flexion, very limited extension TTP over C7  Musculoskeletal: She exhibits tenderness (TTP over R occiput and down R transverse processes).  Neurological: No cranial nerve deficit.  Skin: Skin is warm and dry. No rash noted. No erythema.  Vitals reviewed.         Assessment & Plan:  Neck pain- chronic problem for pt but this time the pain medication is not helping, nor are muscle relaxers.  She is fearful that she will require surgery but would like to see Dr Ethelene Hal for injxns prior to that.  Will start Pred taper.  Can continue pain meds PRN.  Reviewed supportive care and red flags that should prompt return.  Pt expressed understanding and is in agreement w/ plan.

## 2018-04-13 NOTE — Patient Instructions (Signed)
Follow up as scheduled or as needed We'll call you with your Dr Ethelene Hal appt START the Prednisone as directed- 3 tabs at the same time x3 days (w/ food) and then 2 tabs at the same time x3 days and then 1 tab daily Tomorrow take 2 extra strength Tylenol after your Prednisone to get through your test- no muscle relaxers or Oxy Alternate heat and ice or whichever feels better Call with any questions or concerns Hang in there! GOOD LUCK TOMORROW!!!

## 2018-04-20 ENCOUNTER — Other Ambulatory Visit: Payer: Self-pay | Admitting: Family Medicine

## 2018-04-21 MED ORDER — OXYCODONE-ACETAMINOPHEN 5-325 MG PO TABS: 1.0000 | ORAL_TABLET | ORAL | 0 refills | Status: DC | PRN

## 2018-04-21 NOTE — Telephone Encounter (Signed)
Last OV 04/13/18 Oxycodone last filled 03/19/18 #30 with 0

## 2018-05-01 ENCOUNTER — Encounter: Payer: Self-pay | Admitting: Family Medicine

## 2018-05-01 MED ORDER — PREDNISONE 10 MG PO TABS: ORAL_TABLET | ORAL | 0 refills | Status: DC

## 2018-05-19 ENCOUNTER — Other Ambulatory Visit: Payer: Self-pay | Admitting: Family Medicine

## 2018-05-19 MED ORDER — OXYCODONE-ACETAMINOPHEN 5-325 MG PO TABS: 1.0000 | ORAL_TABLET | ORAL | 0 refills | Status: DC | PRN

## 2018-05-19 MED ORDER — ALPRAZOLAM 0.25 MG PO TABS: 0.2500 mg | ORAL_TABLET | Freq: Two times a day (BID) | ORAL | 0 refills | Status: DC | PRN

## 2018-05-19 NOTE — Telephone Encounter (Signed)
Last OV 04/13/18 Alprazolam last filled 04/10/18 30 with 0 Oxycodone last filled 04/21/18 #30 with 0

## 2018-05-28 ENCOUNTER — Other Ambulatory Visit: Payer: Self-pay | Admitting: Family Medicine

## 2018-06-10 ENCOUNTER — Other Ambulatory Visit: Payer: Self-pay | Admitting: Family Medicine

## 2018-06-10 DIAGNOSIS — Z1231 Encounter for screening mammogram for malignant neoplasm of breast: Secondary | ICD-10-CM

## 2018-06-25 ENCOUNTER — Other Ambulatory Visit: Payer: Self-pay | Admitting: Family Medicine

## 2018-06-26 ENCOUNTER — Encounter: Payer: Self-pay | Admitting: Family Medicine

## 2018-06-26 MED ORDER — ALPRAZOLAM 0.25 MG PO TABS: 0.2500 mg | ORAL_TABLET | Freq: Two times a day (BID) | ORAL | 0 refills | Status: DC | PRN

## 2018-06-26 NOTE — Telephone Encounter (Signed)
There is no visit for chronic pain discussion needed every 3 months with PCP. She will need to schedule this with Dr. Beverely Lowabori so that we can continue opioid.

## 2018-06-29 ENCOUNTER — Encounter: Payer: Self-pay | Admitting: Physician Assistant

## 2018-06-29 ENCOUNTER — Other Ambulatory Visit: Payer: Self-pay

## 2018-06-29 ENCOUNTER — Ambulatory Visit: Payer: BC Managed Care – PPO | Admitting: Physician Assistant

## 2018-06-29 VITALS — BP 128/80 | HR 88 | Temp 98.3°F | Resp 16 | Ht 64.0 in | Wt 188.8 lb

## 2018-06-29 DIAGNOSIS — G8929 Other chronic pain: Secondary | ICD-10-CM

## 2018-06-29 DIAGNOSIS — M542 Cervicalgia: Secondary | ICD-10-CM | POA: Diagnosis not present

## 2018-06-29 NOTE — Patient Instructions (Signed)
Please go to the lab today to leave a urine sample.  I will call you with your results. We will alter treatment regimen(s) if indicated by your results.   You will need to follow-up with Dr. Beverely Lowabori every 3 months to continue this medication. Follow-up with Dr. Ethelene Halamos as scheduled.

## 2018-06-29 NOTE — Progress Notes (Addendum)
Reviewed and updated today.  Indication for chronic opioid: Cervical disc herniation, radiculopathy.  Medication and dose: Percocet 5-325 mg daily.  # pills per month: 30 -- lasts 1 month + Last UDS date: 05/2017. Due for repeat today. Pain contract signed (date): 08/25/2017 Date narcotic database last reviewed (include red flags): 06/29/2018  Pain Inventory (1-10 worse): Average Pain 1-2 up until 7-9/10 Pain Right Now 2 My pain is sharp, aching and burning (character i.e. sharp, stabbing, dull, constant etc)  Pain is worse with: prolonged sitting. Relief from Meds: Very significant. Only takes the Percocet during flares.   In the last 24 hours, has pain interfered with the following (1-10 greatest interference) ? General activity 4-5.  Relation with others 2 Enjoyment of life 2 What TIME of day is your pain at its worst? After prolonged sitting.        Mobility/Function: Assistance device: None How many minutes can you walk? 30+ minutes.  Ability to climb steps?  No Do you drive? Yes Disabled (date): No  Prior Studies: Review EMR.  Physicians involved in your care: Any changes since last visit?  Dr. Ethelene Halamos.    Past Medical History:  Diagnosis Date  . Asthma   . Dyspnea   . GERD (gastroesophageal reflux disease)   . Lupus (HCC)   . Mitral regurgitation   . Palpitations   . Rosacea     Current Outpatient Medications on File Prior to Visit  Medication Sig Dispense Refill  . albuterol (PROVENTIL HFA) 108 (90 Base) MCG/ACT inhaler Inhale 2 puffs into the lungs every 4 (four) hours as needed for wheezing. 1 Inhaler 12  . ALPRAZolam (XANAX) 0.25 MG tablet Take 1 tablet (0.25 mg total) by mouth 2 (two) times daily as needed. 30 tablet 0  . cyclobenzaprine (FLEXERIL) 10 MG tablet Take 1 tablet (10 mg total) by mouth daily as needed. 30 tablet 3  . oxyCODONE-acetaminophen (PERCOCET/ROXICET) 5-325 MG tablet Take 1 tablet by mouth every 4 (four) hours as needed. 30 tablet 0    . sodium chloride (OCEAN) 0.65 % SOLN nasal spray Place 1 spray into both nostrils as needed for congestion. 15 mL 0  . UNABLE TO FIND Med Name: CBD tablets patient takes one at night    . EPINEPHrine 0.3 mg/0.3 mL IJ SOAJ injection Inject 0.3 mLs (0.3 mg total) into the muscle once. (Patient not taking: Reported on 04/13/2018) 1 Device 2  . fluticasone (FLONASE) 50 MCG/ACT nasal spray Place 2 sprays into both nostrils daily. (Patient not taking: Reported on 04/13/2018) 16 g 0   No current facility-administered medications on file prior to visit.     Allergies  Allergen Reactions  . Flovent [Fluticasone Propionate]     Difficulty breathing. No issues with Qvar Rxed by Dr Shelle Ironlance  . Codeine     REACTION: vomiting    Family History  Problem Relation Age of Onset  . Coronary artery disease Mother   . Hypertension Mother   . Stroke Mother   . Rheum arthritis Mother   . Coronary artery disease Father   . Allergies Sister   . Asthma Sister   . Rheum arthritis Maternal Grandmother     Social History   Socioeconomic History  . Marital status: Married    Spouse name: Not on file  . Number of children: 2  . Years of education: Not on file  . Highest education level: Not on file  Occupational History  . Occupation:  Employer: [employer]  . 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 Financial resource strain: Not on file  . Food insecurity:    Worry: Not on file    Inability: Not on file  . Transportation needs:    Medical: Not on file    Non-medical: Not on file  Tobacco Use  . Smoking status: Never Smoker  . Smokeless tobacco: Never Used  Substance and Sexual Activity  . Alcohol use: No  . Drug use: No  . Sexual activity: Not on file  Lifestyle  . Physical activity:    Days per week: Not on file    Minutes per session: Not on file  . Stress: Not on file  Relationships  . Social connections:     Talks on phone: Not on file    Gets together: Not on file    Attends religious service: Not on file    Active member of club or organization: Not on file    Attends meetings of clubs or organizations: Not on file    Relationship status: Not on file  Other Topics Concern  . Not on file  Social History Narrative  . Not on file   Review of Systems - See HPI.  All other ROS are negative.  BP 128/80   Pulse 88   Temp 98.3 F (36.8 C) (Oral)   Resp 16   Ht 5\' 4"  (1.626 m)   Wt 188 lb 12.8 oz (85.6 kg)   SpO2 98%   BMI 32.41 kg/m   Physical Exam  Constitutional: She appears well-developed and well-nourished.  HENT:  Head: Normocephalic and atraumatic.  Eyes: Conjunctivae are normal.  Cardiovascular: Normal rate, regular rhythm, normal heart sounds and intact distal pulses.  Pulmonary/Chest: Effort normal and breath sounds normal. No stridor. No respiratory distress. She has no wheezes. She has no rales. She exhibits no tenderness.  Psychiatric: She has a normal mood and affect.  Vitals reviewed.  Assessment/Plan: Cervicalgia CSC updated today. Will obtain UDS.  CS Database reviewed. No red flags. Medication refilled. Follow-up every 3 months for ongoing prescription. Chart sent to PCP for review and co-signature.       Lynn Climes, PA-C   Reviewed note written by PA-C and agree w/ above.  Lynn Rhymes, MD

## 2018-06-29 NOTE — Assessment & Plan Note (Signed)
CSC updated today. Will obtain UDS.  CS Database reviewed. No red flags. Medication refilled. Follow-up every 3 months for ongoing prescription. Chart sent to PCP for review and co-signature.

## 2018-07-01 ENCOUNTER — Encounter: Payer: Self-pay | Admitting: Family Medicine

## 2018-07-02 LAB — PAIN MGMT, PROFILE 8 W/CONF, U
6 Acetylmorphine: NEGATIVE ng/mL (ref <10)
ALPHAHYDROXYALPRAZOLAM: 25 ng/mL — AB (ref <25)
Alcohol Metabolites: NEGATIVE ng/mL (ref <500)
Alphahydroxymidazolam: NEGATIVE ng/mL (ref <50)
Alphahydroxytriazolam: NEGATIVE ng/mL (ref <50)
Aminoclonazepam: NEGATIVE ng/mL (ref <25)
Amphetamines: NEGATIVE ng/mL (ref <500)
BUPRENORPHINE, URINE: NEGATIVE ng/mL (ref <5)
Benzodiazepines: POSITIVE ng/mL — AB (ref <100)
Cocaine Metabolite: NEGATIVE ng/mL (ref <150)
Creatinine: 58.6 mg/dL
Hydroxyethylflurazepam: NEGATIVE ng/mL (ref <50)
Lorazepam: NEGATIVE ng/mL (ref <50)
MDMA: NEGATIVE ng/mL (ref <500)
Marijuana Metabolite: NEGATIVE ng/mL (ref <20)
NORDIAZEPAM: NEGATIVE ng/mL (ref <50)
Noroxycodone: 120 ng/mL — ABNORMAL HIGH (ref <50)
OPIATES: NEGATIVE ng/mL (ref <100)
OXAZEPAM: NEGATIVE ng/mL (ref <50)
OXYCODONE: POSITIVE ng/mL — AB (ref <100)
Oxidant: NEGATIVE ug/mL (ref <200)
Oxycodone: NEGATIVE ng/mL (ref <50)
Oxymorphone: 187 ng/mL — ABNORMAL HIGH (ref <50)
PH: 6.65 (ref 4.5–9.0)
Temazepam: NEGATIVE ng/mL (ref <50)

## 2018-07-02 MED ORDER — OXYCODONE-ACETAMINOPHEN 5-325 MG PO TABS: 1.0000 | ORAL_TABLET | ORAL | 0 refills | Status: DC | PRN

## 2018-07-13 ENCOUNTER — Ambulatory Visit: Payer: BC Managed Care – PPO | Admitting: Family Medicine

## 2018-08-09 ENCOUNTER — Other Ambulatory Visit: Payer: Self-pay | Admitting: Physician Assistant

## 2018-08-10 MED ORDER — ALPRAZOLAM 0.25 MG PO TABS: 0.2500 mg | ORAL_TABLET | Freq: Two times a day (BID) | ORAL | 0 refills | Status: DC | PRN

## 2018-08-10 MED ORDER — OXYCODONE-ACETAMINOPHEN 5-325 MG PO TABS: 1.0000 | ORAL_TABLET | ORAL | 0 refills | Status: DC | PRN

## 2018-08-10 NOTE — Telephone Encounter (Signed)
Last OV 06/29/18 Oxycodone last filled 07/02/18 #30 with 0

## 2018-08-31 ENCOUNTER — Other Ambulatory Visit: Payer: Self-pay | Admitting: Family Medicine

## 2018-09-11 ENCOUNTER — Other Ambulatory Visit: Payer: Self-pay | Admitting: Family Medicine

## 2018-09-11 MED ORDER — OXYCODONE-ACETAMINOPHEN 5-325 MG PO TABS: 1.0000 | ORAL_TABLET | ORAL | 0 refills | Status: DC | PRN

## 2018-09-11 NOTE — Telephone Encounter (Signed)
Last Filled: 08/10/18 #30,0 Last OV: 06/29/18 (UDS was done at this time)

## 2018-09-17 ENCOUNTER — Other Ambulatory Visit: Payer: Self-pay | Admitting: Family Medicine

## 2018-09-17 NOTE — Telephone Encounter (Signed)
Defer to Dr Beverely Low

## 2018-09-17 NOTE — Telephone Encounter (Signed)
Last OV 06/29/18 Alprazolam last filled 08/10/18 #30 with 0 

## 2018-09-18 ENCOUNTER — Encounter: Payer: Self-pay | Admitting: Family Medicine

## 2018-09-18 ENCOUNTER — Other Ambulatory Visit: Payer: Self-pay | Admitting: Family Medicine

## 2018-09-18 NOTE — Telephone Encounter (Signed)
Last OV 06/29/18 Alprazolam last filled 08/10/18 #30 with 0

## 2018-09-23 ENCOUNTER — Ambulatory Visit: Payer: BC Managed Care – PPO | Admitting: Physician Assistant

## 2018-09-23 ENCOUNTER — Encounter: Payer: Self-pay | Admitting: Physician Assistant

## 2018-09-23 ENCOUNTER — Encounter: Payer: Self-pay | Admitting: Family Medicine

## 2018-09-23 ENCOUNTER — Other Ambulatory Visit: Payer: Self-pay

## 2018-09-23 ENCOUNTER — Ambulatory Visit: Payer: Self-pay

## 2018-09-23 VITALS — BP 142/100 | HR 96 | Temp 98.1°F | Resp 14 | Ht 64.0 in | Wt 187.0 lb

## 2018-09-23 DIAGNOSIS — M542 Cervicalgia: Secondary | ICD-10-CM

## 2018-09-23 MED ORDER — PREDNISONE 10 MG PO TABS: ORAL_TABLET | ORAL | 0 refills | Status: DC
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
## 2018-09-23 NOTE — Patient Instructions (Signed)
Please take the steroid taper as directed. Continue Flexeril. Use tylenol for mild breakthrough pain and your Percocet for severe pain only.  No heavy lifting! Can continue heating pad in 15 minute intervals to neck and shoulder. The muscular pain may be worse tomorrow (this is typical) but should then ease up.  Follow-up with [place] if symptoms are not resolved by Monday. Call or return sooner if anything worsens or new symptoms develop.

## 2018-09-28 ENCOUNTER — Ambulatory Visit: Payer: BC Managed Care – PPO | Admitting: Family Medicine

## 2018-10-01 ENCOUNTER — Ambulatory Visit: Payer: BC Managed Care – PPO | Admitting: Family Medicine

## 2018-10-01 ENCOUNTER — Other Ambulatory Visit: Payer: Self-pay | Admitting: Family Medicine

## 2018-10-01 ENCOUNTER — Encounter: Payer: Self-pay | Admitting: Family Medicine

## 2018-10-01 VITALS — BP 124/84 | HR 94 | Temp 98.7°F | Ht 64.0 in | Wt 184.0 lb

## 2018-10-01 DIAGNOSIS — J029 Acute pharyngitis, unspecified: Secondary | ICD-10-CM | POA: Diagnosis not present

## 2018-10-01 DIAGNOSIS — B9789 Other viral agents as the cause of diseases classified elsewhere: Secondary | ICD-10-CM | POA: Diagnosis not present

## 2018-10-01 DIAGNOSIS — J028 Acute pharyngitis due to other specified organisms: Secondary | ICD-10-CM | POA: Diagnosis not present

## 2018-10-01 DIAGNOSIS — Z1231 Encounter for screening mammogram for malignant neoplasm of breast: Secondary | ICD-10-CM

## 2018-10-01 LAB — POCT RAPID STREP A (OFFICE): Rapid Strep A Screen: NEGATIVE

## 2018-10-01 NOTE — Progress Notes (Signed)
Lynn Campos is a 47 y.o. female  Chief Complaint  Patient presents with  . Sore Throat    sore throat, ear pain started yesterday. No cough or congestion    HPI: Lynn Campos is a 47 y.o. female complains of sore throat, "low grade fever" of 100.0-100.4 that began yesterday.  Took tylenol with lat dose at 2pm today.  Some Rt ear pain last night that has resolved. No cough, runny nose, nasal congestion, headache.  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 There is a confirmed case of strep.  Past Medical History:  Diagnosis Date  . Asthma   . Dyspnea   . GERD (gastroesophageal reflux disease)   . Lupus (HCC)   . Mitral regurgitation   . Palpitations   . Rosacea     Past Surgical History:  Procedure Laterality Date  . APPENDECTOMY    . CERVICAL SPINE SURGERY  01/2010  . CHOLECYSTECTOMY  05/2010    Social History   Socioeconomic History  . Marital status: Married    Spouse name: Not on file  . Number of children: 2  . Years of education: Not on file  . Highest education level: Not on file  Occupational History  . Occupation:      Employer: [employer]  . 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 Financial resource strain: Not on file  . Food insecurity:    Worry: Not on file    Inability: Not on file  . Transportation needs:    Medical: Not on file    Non-medical: Not on file  Tobacco Use  . Smoking status: Never Smoker  . Smokeless tobacco: Never Used  Substance and Sexual Activity  . Alcohol use: No  . Drug use: No  . Sexual activity: Not on file  Lifestyle  . Physical activity:    Days per week: Not on file    Minutes per session: Not on file  . Stress: Not on file  Relationships  . Social connections:    Talks on phone: Not on file    Gets together: Not on file    Attends religious service: Not on file    Active  member of club or organization: Not on file    Attends meetings of clubs or organizations: Not on file    Relationship status: Not on file  . Intimate partner violence:    Fear of current or ex partner: Not on file    Emotionally abused: Not on file    Physically abused: Not on file    Forced sexual activity: Not on file  Other Topics Concern  . Not on file  Social History Narrative  . Not on file    Family History  Problem Relation Age of Onset  . Coronary artery disease Mother   . Hypertension Mother   . Stroke Mother   . Rheum arthritis Mother   . Coronary artery disease Father   . Allergies Sister   . Asthma Sister   . Rheum arthritis Maternal Grandmother      Immunization History  Administered Date(s) Administered  . Influenza Split 10/14/2011, 08/27/2012  . Influenza,inj,Quad PF,6+ Mos 09/20/2013  . Influenza,inj,quad, With Preservative 08/25/2017  . PPD Test 02/13/2011  . Tdap 05/14/2016    Outpatient Encounter Medications as of 10/01/2018  Medication Sig  . albuterol (PROVENTIL HFA) 108 (  90 Base) MCG/ACT inhaler Inhale 2 puffs into the lungs every 4 (four) hours as needed for wheezing.  Marland Kitchen ALPRAZolam (XANAX) 0.25 MG tablet TAKE 1 TABLET(0.25 MG) BY MOUTH TWICE DAILY AS NEEDED  . cyclobenzaprine (FLEXERIL) 10 MG tablet TAKE 1 TABLET(10 MG) BY MOUTH DAILY AS NEEDED  . EPINEPHrine 0.3 mg/0.3 mL IJ SOAJ injection Inject 0.3 mLs (0.3 mg total) into the muscle once.  . fluticasone (FLONASE) 50 MCG/ACT nasal spray Place 2 sprays into both nostrils daily.  Marland Kitchen oxyCODONE-acetaminophen (PERCOCET/ROXICET) 5-325 MG tablet Take 1 tablet by mouth every 4 (four) hours as needed.  . predniSONE (DELTASONE) 10 MG tablet 3 tabs x3 days and then 2 tabs x3 days and then 1 tab x3 days.  Take w/ food.  . sodium chloride (OCEAN) 0.65 % SOLN nasal spray Place 1 spray into both nostrils as needed for congestion.  Marland Kitchen UNABLE TO FIND Med Name: CBD tablets patient takes one at night   No  facility-administered encounter medications on file as of 10/01/2018.      ROS: Pertinent positives noted above in HPI, remainder of ROS negative/non-contributory   Allergies  Allergen Reactions  . Flovent [Fluticasone Propionate]     Difficulty breathing. No issues with Qvar Rxed by Dr Shelle Iron  . Codeine     REACTION: vomiting    BP 124/84 (BP Location: Left Arm, Patient Position: Sitting, Cuff Size: Normal)   Pulse 94   Temp 98.7 F (37.1 C) (Oral)   Ht 5\' 4"  (1.626 m)   Wt 184 lb (83.5 kg)   SpO2 98%   BMI 31.58 kg/m   Physical Exam  Constitutional: She appears well-developed and well-nourished. No distress.  HENT:  Head: Normocephalic and atraumatic.  Right Ear: Tympanic membrane, external ear and ear canal normal. No tenderness. No middle ear effusion.  Left Ear: Tympanic membrane, external ear and ear canal normal. No tenderness.  No middle ear effusion.  Nose: No mucosal edema or rhinorrhea. Right sinus exhibits no maxillary sinus tenderness and no frontal sinus tenderness. Left sinus exhibits no maxillary sinus tenderness and no frontal sinus tenderness.  Mouth/Throat: Mucous membranes are normal. Posterior oropharyngeal erythema present. No oropharyngeal exudate or posterior oropharyngeal edema.  Cardiovascular: Normal rate, regular rhythm and normal heart sounds.  Pulmonary/Chest: Effort normal and breath sounds normal. No respiratory distress. She has no wheezes.  Lymphadenopathy:    She has no cervical adenopathy.  Psychiatric: She has a normal mood and affect. Her behavior is normal.     A/P:  1. Viral sore throat - POCT rapid strep A - negative - Culture, Group A Strep - rapid negative and exam not c/w strep but d/t positive strep exposure, will send cx to definitively rule out - supportive care to include tylenol/ibuprofen PRN, fluids, lozenges and/or throat spray - f/u if symptoms worsen or do not improve in 7-10 days Discussed plan and reviewed  medications with patient, including risks, benefits, and potential side effects. Pt expressed understand. All questions answered.

## 2018-10-03 LAB — CULTURE, GROUP A STREP
MICRO NUMBER:: 91342552
SPECIMEN QUALITY:: ADEQUATE

## 2018-10-05 ENCOUNTER — Encounter: Payer: Self-pay | Admitting: Family Medicine

## 2018-10-05 ENCOUNTER — Other Ambulatory Visit: Payer: Self-pay

## 2018-10-05 ENCOUNTER — Ambulatory Visit: Payer: BC Managed Care – PPO | Admitting: Family Medicine

## 2018-10-05 VITALS — BP 126/82 | HR 80 | Temp 98.9°F | Resp 17 | Ht 64.0 in | Wt 183.2 lb

## 2018-10-05 DIAGNOSIS — J029 Acute pharyngitis, unspecified: Secondary | ICD-10-CM

## 2018-10-05 DIAGNOSIS — G8929 Other chronic pain: Secondary | ICD-10-CM | POA: Insufficient documentation

## 2018-10-05 DIAGNOSIS — N924 Excessive bleeding in the premenopausal period: Secondary | ICD-10-CM | POA: Diagnosis not present

## 2018-10-05 DIAGNOSIS — Z9889 Other specified postprocedural states: Secondary | ICD-10-CM | POA: Diagnosis not present

## 2018-10-05 DIAGNOSIS — Z23 Encounter for immunization: Secondary | ICD-10-CM | POA: Diagnosis not present

## 2018-10-05 NOTE — Assessment & Plan Note (Signed)
Improving viral illness.  No need for abx.  Reviewed supportive care and red flags that should prompt return.  Pt expressed understanding and is in agreement w/ plan.

## 2018-10-05 NOTE — Assessment & Plan Note (Signed)
Cause of chronic neck pain for pt.

## 2018-10-05 NOTE — Patient Instructions (Addendum)
Schedule your complete physical in 6 months We'll notify you of your lab results and make any changes if needed Continue to allow yourself time to recover!! Request pain meds as needed for the neck pain Keep me updated on your periods so we can determine if we need to intervene Call with any questions or concerns Happy Holidays!!!

## 2018-10-05 NOTE — Assessment & Plan Note (Signed)
Ongoing issue for pt.  She is trying to avoid surgery if possible.  She does well on Percocet PRN.  She is doing Yoga and attempting to relieve pain through other means rather than just relying on medication.  Will continue to fill as long as pt remains compliant w/ controlled substance agreement.  Pt expressed understanding and is in agreement w/ plan.

## 2018-10-05 NOTE — Progress Notes (Signed)
   Subjective:    Patient ID: Lynn Campos, female    DOB: [birth year], 47 y.o.   MRN: [id number]  HPI  Chronic pain management- due to chronic neck pain.  Pt reports pain will come in cycles and she'll be 'ok for 2-3 days' and not require any medication and then will need to take meds for a day or 2 to calm things down.  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  Indication for chronic opioid: chronic neck pain s/p discectomy Medication and dose: Percocet 5/325mg  q4 PRN # pills per month: 30 Last UDS date: 06/29/18  Opioid Treatment Agreement signed (Y/N): Yes Opioid Treatment Agreement last reviewed with patient:  August 5th, 2019 [facility] reviewed this encounter (include red flags):  no red flags  Sore throat- was seen at [facility] and strep was (-).  Throat is improving and low grade temp responds to tylenol or ibuprofen.  Feeling better but not fully recovered.  + sick contacts.  Menorrhagia- pt reports long, heavy periods and recently she has started passing clots.  Pt is aware she is approaching menopause.  Not interested in hormones if possible.  Started taking iron supplement and has increased red meat intake.  + fatigue   Review of Systems For ROS see HPI     Objective:   Physical Exam  Constitutional: She is oriented to person, place, and time. She appears well-developed and well-nourished. No distress.  HENT:  Head: Normocephalic and atraumatic.  Eyes: Pupils are equal, round, and reactive to light. Conjunctivae and EOM are normal.  Neck: Normal range of motion. Neck supple. No thyromegaly present.  Cardiovascular: Normal rate, regular rhythm, normal heart sounds and intact distal pulses.  No murmur heard. Pulmonary/Chest: Effort normal and breath sounds normal. No respiratory distress.  Abdominal: Soft. She exhibits no distension. There is no tenderness.  Musculoskeletal: She exhibits no edema.  Lymphadenopathy:    She has no cervical  adenopathy.  Neurological: She is alert and oriented to person, place, and time.  Skin: Skin is warm and dry.  Psychiatric: She has a normal mood and affect. Her behavior is normal.  Vitals reviewed.         Assessment & Plan:  Menorrhagia- new to provider.  Pt reports periods have been long for the last year or 2 but have just recently become quite heavy.  She started an iron supplement.  Check CBC and determine if further work up/intervention is needed.  Pt expressed understanding and is in agreement w/ plan.

## 2018-10-06 ENCOUNTER — Encounter: Payer: Self-pay | Admitting: General Practice

## 2018-10-06 LAB — CBC WITH DIFFERENTIAL/PLATELET
Basophils Absolute: 0.1 10*3/uL (ref 0.0–0.1)
Basophils Relative: 0.8 % (ref 0.0–3.0)
EOS PCT: 2.6 % (ref 0.0–5.0)
Eosinophils Absolute: 0.2 10*3/uL (ref 0.0–0.7)
HEMATOCRIT: 41.5 % (ref 36.0–46.0)
Hemoglobin: 13.9 g/dL (ref 12.0–15.0)
LYMPHS ABS: 1.5 10*3/uL (ref 0.7–4.0)
Lymphocytes Relative: 22.6 % (ref 12.0–46.0)
MCHC: 33.4 g/dL (ref 30.0–36.0)
MCV: 90 fl (ref 78.0–100.0)
MONOS PCT: 6.5 % (ref 3.0–12.0)
Monocytes Absolute: 0.4 10*3/uL (ref 0.1–1.0)
NEUTROS ABS: 4.6 10*3/uL (ref 1.4–7.7)
NEUTROS PCT: 67.5 % (ref 43.0–77.0)
PLATELETS: 290 10*3/uL (ref 150.0–400.0)
RBC: 4.61 Mil/uL (ref 3.87–5.11)
RDW: 13.6 % (ref 11.5–15.5)
WBC: 6.8 10*3/uL (ref 4.0–10.5)

## 2018-10-08 ENCOUNTER — Other Ambulatory Visit: Payer: Self-pay | Admitting: Family Medicine

## 2018-10-09 ENCOUNTER — Encounter: Payer: Self-pay | Admitting: Family Medicine

## 2018-10-15 ENCOUNTER — Other Ambulatory Visit: Payer: Self-pay | Admitting: Family Medicine

## 2018-10-16 MED ORDER — OXYCODONE-ACETAMINOPHEN 5-325 MG PO TABS: 1.0000 | ORAL_TABLET | ORAL | 0 refills | Status: DC | PRN

## 2018-10-16 NOTE — Telephone Encounter (Signed)
Last OV 10/05/18 Oxycodone last filled 09/11/18 #30 with 0

## 2018-10-20 ENCOUNTER — Encounter: Payer: Self-pay | Admitting: Family Medicine

## 2018-11-04 ENCOUNTER — Ambulatory Visit
Admission: RE | Admit: 2018-11-04 | Discharge: 2018-11-04 | Disposition: A | Discharge status: Home / Self Care | Payer: BC Managed Care – PPO | Source: Ambulatory Visit | Attending: Family Medicine | Admitting: Family Medicine

## 2018-11-12 ENCOUNTER — Encounter: Payer: Self-pay | Admitting: Family Medicine

## 2018-11-12 ENCOUNTER — Other Ambulatory Visit: Payer: Self-pay | Admitting: Physician Assistant

## 2018-11-12 ENCOUNTER — Other Ambulatory Visit: Payer: Self-pay | Admitting: Family Medicine

## 2018-11-12 MED ORDER — CYCLOBENZAPRINE HCL 10 MG PO TABS: 10.0000 mg | ORAL_TABLET | Freq: Every day | ORAL | 0 refills | Status: DC | PRN

## 2018-11-12 MED ORDER — OXYCODONE-ACETAMINOPHEN 5-325 MG PO TABS: 1.0000 | ORAL_TABLET | ORAL | 0 refills | Status: DC | PRN

## 2018-11-12 NOTE — Telephone Encounter (Signed)
Last OV 10/05/18 Oxycodone last filled 10/16/18 #30 with 0

## 2018-11-19 ENCOUNTER — Ambulatory Visit: Payer: BC Managed Care – PPO | Admitting: Family Medicine

## 2018-11-19 ENCOUNTER — Other Ambulatory Visit: Payer: Self-pay

## 2018-11-19 ENCOUNTER — Encounter: Payer: Self-pay | Admitting: Family Medicine

## 2018-11-19 VITALS — BP 127/80 | HR 100 | Resp 16 | Ht 64.0 in | Wt 189.1 lb

## 2018-11-19 DIAGNOSIS — J4541 Moderate persistent asthma with (acute) exacerbation: Secondary | ICD-10-CM | POA: Diagnosis not present

## 2018-11-19 DIAGNOSIS — J329 Chronic sinusitis, unspecified: Secondary | ICD-10-CM | POA: Diagnosis not present

## 2018-11-19 DIAGNOSIS — B9689 Other specified bacterial agents as the cause of diseases classified elsewhere: Secondary | ICD-10-CM

## 2018-11-19 DIAGNOSIS — R509 Fever, unspecified: Secondary | ICD-10-CM | POA: Diagnosis not present

## 2018-11-19 LAB — POCT INFLUENZA A/B
Influenza A, POC: NEGATIVE
Influenza B, POC: NEGATIVE

## 2018-11-19 MED ORDER — AMOXICILLIN 875 MG PO TABS: 875.0000 mg | ORAL_TABLET | Freq: Two times a day (BID) | ORAL | 0 refills | Status: DC

## 2018-11-19 MED ORDER — ALBUTEROL SULFATE HFA 108 (90 BASE) MCG/ACT IN AERS: 2.0000 | INHALATION_SPRAY | RESPIRATORY_TRACT | 12 refills | Status: DC | PRN

## 2018-11-19 MED ORDER — PROMETHAZINE-DM 6.25-15 MG/5ML PO SYRP: 5.0000 mL | ORAL_SOLUTION | Freq: Four times a day (QID) | ORAL | 0 refills | Status: DC | PRN

## 2018-11-19 MED ORDER — BECLOMETHASONE DIPROP HFA 80 MCG/ACT IN AERB: 2.0000 | INHALATION_SPRAY | Freq: Two times a day (BID) | RESPIRATORY_TRACT | 3 refills | Status: DC

## 2018-11-19 MED ORDER — IPRATROPIUM-ALBUTEROL 0.5-2.5 (3) MG/3ML IN SOLN
3.0000 mL | Freq: Once | RESPIRATORY_TRACT | Status: AC
  Administered 2018-11-19: 3 mL via RESPIRATORY_TRACT

## 2018-11-19 NOTE — Progress Notes (Signed)
   Subjective:    Patient ID: Lynn Campos, female    DOB: December 02, 1970, 47 y.o.   MRN: [id number]  HPI Fever/Body aches- 'the cough is just relentless', started 12/24.  SOB w/ minimal exertion.  Fever started this AM.  No sinus pain/pressure.  + nasal congestion/drainage.  No burning of chest- 'just tight'.  + HA.  + sick contacts.   Review of Systems For ROS see HPI     Objective:   Physical Exam Vitals signs reviewed.  Constitutional:      General: She is not in acute distress.    Appearance: She is well-developed.  HENT:     Head: Normocephalic and atraumatic.     Right Ear: Tympanic membrane normal.     Left Ear: Tympanic membrane normal.     Nose: Mucosal edema and rhinorrhea present.     Right Sinus: Maxillary sinus tenderness and frontal sinus tenderness present.     Left Sinus: Maxillary sinus tenderness and frontal sinus tenderness present.     Mouth/Throat:     Pharynx: Uvula midline. Posterior oropharyngeal erythema present. No oropharyngeal exudate.  Eyes:     Conjunctiva/sclera: Conjunctivae normal.     Pupils: Pupils are equal, round, and reactive to light.  Neck:     Musculoskeletal: Normal range of motion and neck supple.  Cardiovascular:     Rate and Rhythm: Normal rate and regular rhythm.     Heart sounds: Normal heart sounds.  Pulmonary:     Effort: Pulmonary effort is normal. No respiratory distress.     Breath sounds: Decreased breath sounds (improved s/p neb tx) and wheezing (faint expiratory wheezes throughout- improved s/p neb tx) present. No rhonchi or rales.     Comments: Spasmodic cough- improved w/p neb tx Lymphadenopathy:     Cervical: No cervical adenopathy.  Skin:    General: Skin is warm and dry.           Assessment & Plan:  Asthma exacerbation- pt has hx of this.  Cough and SOB improved s/p neb tx in office.  Has been doing well recently so stopped her Qvar.  Will restart until feeling better.  Use the Albuterol as needed.  Cough meds  PRN.  Reviewed supportive care and red flags that should prompt return.  Pt expressed understanding and is in agreement w/ plan.   Bacterial sinusitis- new.  PE consistent w/ infxn.  Start abx.  Reviewed supportive care and red flags that should prompt return.  Pt expressed understanding and is in agreement w/ plan.

## 2018-11-19 NOTE — Addendum Note (Signed)
Addended by: Geannie RisenBRODMERKEL, JESSICA L on: 11/19/2018 10:05 AM   Modules accepted: Orders

## 2018-11-19 NOTE — Patient Instructions (Addendum)
Follow up as needed or as scheduled START the Amoxicillin twice daily- take w/ food Drink plenty of fluids REST! RESTART the Qvar- 2 puffs twice daily USE the Albuterol as needed USE the cough syrup as needed- may cause drowsiness Call with any questions or concerns Happy New Year!!

## 2018-11-24 ENCOUNTER — Encounter: Payer: Self-pay | Admitting: Family Medicine

## 2018-12-10 ENCOUNTER — Encounter: Payer: Self-pay | Admitting: Family Medicine

## 2018-12-11 MED ORDER — PREDNISONE 10 MG PO TABS: ORAL_TABLET | ORAL | 0 refills | Status: DC

## 2018-12-24 ENCOUNTER — Other Ambulatory Visit: Payer: Self-pay | Admitting: Family Medicine

## 2018-12-25 MED ORDER — CYCLOBENZAPRINE HCL 10 MG PO TABS: 10.0000 mg | ORAL_TABLET | Freq: Every day | ORAL | 0 refills | Status: DC | PRN

## 2018-12-25 MED ORDER — OXYCODONE-ACETAMINOPHEN 5-325 MG PO TABS: 1.0000 | ORAL_TABLET | ORAL | 0 refills | Status: DC | PRN

## 2018-12-25 NOTE — Telephone Encounter (Signed)
Last OV: 10/05/18 Last refill: flexeril 11/12/18 #30, 0                 Percocet 11/12/18 #30, 0

## 2019-01-08 ENCOUNTER — Encounter: Payer: Self-pay | Admitting: Plastic Surgery

## 2019-01-08 ENCOUNTER — Ambulatory Visit: Payer: BC Managed Care – PPO | Admitting: Plastic Surgery

## 2019-01-08 VITALS — BP 138/81 | HR 90 | Temp 98.1°F | Ht 64.0 in | Wt 190.0 lb

## 2019-01-08 DIAGNOSIS — L719 Rosacea, unspecified: Secondary | ICD-10-CM | POA: Diagnosis not present

## 2019-01-08 NOTE — Progress Notes (Signed)
The patient is a 48 year old for evaluation of her face.  She has rosacea and is interested in laser treatment.  She has had a laser treatment in the past and was pleased with the results.  Been several months since she has had it.  Her cheeks are red and the lateral inferior portion of her lips.  She has broken blood vessels and redness in these areas.  I think she would benefit well from IPL treatments.  That we can get her on a regimen for skin care.

## 2019-01-24 ENCOUNTER — Encounter: Payer: Self-pay | Admitting: Family Medicine

## 2019-01-26 ENCOUNTER — Encounter: Payer: Self-pay | Admitting: Plastic Surgery

## 2019-01-26 ENCOUNTER — Ambulatory Visit: Payer: Self-pay

## 2019-01-26 ENCOUNTER — Other Ambulatory Visit: Payer: Self-pay

## 2019-01-26 ENCOUNTER — Ambulatory Visit: Payer: BC Managed Care – PPO | Admitting: Family Medicine

## 2019-01-26 ENCOUNTER — Encounter: Payer: Self-pay | Admitting: Family Medicine

## 2019-01-26 ENCOUNTER — Ambulatory Visit (INDEPENDENT_AMBULATORY_CARE_PROVIDER_SITE_OTHER): Payer: Self-pay | Admitting: Plastic Surgery

## 2019-01-26 VITALS — BP 150/99 | HR 87 | Temp 98.3°F | Ht 64.0 in | Wt 190.0 lb

## 2019-01-26 VITALS — BP 144/100 | HR 97 | Temp 98.7°F | Resp 16 | Ht 64.0 in | Wt 183.8 lb

## 2019-01-26 DIAGNOSIS — J0100 Acute maxillary sinusitis, unspecified: Secondary | ICD-10-CM | POA: Diagnosis not present

## 2019-01-26 DIAGNOSIS — H8110 Benign paroxysmal vertigo, unspecified ear: Secondary | ICD-10-CM

## 2019-01-26 DIAGNOSIS — L719 Rosacea, unspecified: Secondary | ICD-10-CM

## 2019-01-26 MED ORDER — AZITHROMYCIN 250 MG PO TABS: ORAL_TABLET | ORAL | 0 refills | Status: DC

## 2019-01-26 MED ORDER — MECLIZINE HCL 25 MG PO TABS: 25.0000 mg | ORAL_TABLET | Freq: Three times a day (TID) | ORAL | 0 refills | Status: DC | PRN

## 2019-01-26 NOTE — Telephone Encounter (Signed)
Pt having dizziness and vertigo. Pt stated that she feels she is spinning, lightheadedness with position changes. Pt c/o bilateral ear pain. HR WNL. Pt with nausea and 1 episode yesterday. Symptoms began Sat or Sun. Care advice given to pt and pt verbalized understanding. No same day appointments with PCP. Appt given SDA with Dr Mardelle Matte.   Reason for Disposition . [1] MODERATE dizziness (e.g., interferes with normal activities) AND [2] has NOT been evaluated by physician for this  (Exception: dizziness caused by heat exposure, sudden standing, or poor fluid intake)  Answer Assessment - Initial Assessment Questions 1. DESCRIPTION: "Describe your dizziness."     Laying down or rolling over in bed  2. LIGHTHEADED: "Do you feel lightheaded?" (e.g., somewhat faint, woozy, weak upon standing)     Feels like she is moving but the room is stable 3. VERTIGO: "Do you feel like either you or the room is spinning or tilting?" (i.e. vertigo)    yes 4. SEVERITY: "How bad is it?"  "Do you feel like you are going to faint?" "Can you stand and walk?"   - MILD - walking normally   - MODERATE - interferes with normal activities (e.g., work, school)    - SEVERE - unable to stand, requires support to walk, feels like passing out now.      moderate 5. ONSET:  "When did the dizziness begin?"     Sat and Sunday 6. AGGRAVATING FACTORS: "Does anything make it worse?" (e.g., standing, change in head position)   head moving around laying to standing, change is head position 7. HEART RATE: "Can you tell me your heart rate?" "How many beats in 15 seconds?"  (Note: not all patients can do this)      76 per minute 8. CAUSE: "What do you think is causing the dizziness?"     Head congestion and ear stopped up and hurt 9. RECURRENT SYMPTOM: "Have you had dizziness before?" If so, ask: "When was the last time?" "What happened that time?"     10  years ago and every time with a sinus 10. OTHER SYMPTOMS: "Do you have any other  symptoms?" (e.g., fever, chest pain, vomiting, diarrhea, bleeding)       Nauseated, vomiting yesterday x 1 , poor appetite 11. PREGNANCY: "Is there any chance you are pregnant?" "When was your last menstrual period?"      LMP: yesterday  Protocols used: DIZZINESS [employer]

## 2019-01-26 NOTE — Patient Instructions (Signed)
Please follow up if symptoms do not improve or as needed.    Benign Positional Vertigo Vertigo is the feeling that you or your surroundings are moving when they are not. Benign positional vertigo is the most common form of vertigo. This is usually a harmless condition (benign). This condition is positional. This means that symptoms are triggered by certain movements and positions. This condition can be dangerous if it occurs while you are doing something that could cause harm to you or others. This includes activities such as driving or operating machinery. What are the causes? In many cases, the cause of this condition is not known. It may be caused by a disturbance in an area of the inner ear that helps your brain to sense movement and balance. This disturbance can be caused by:  Viral infection (labyrinthitis).  Head injury.  Repetitive motion, such as jumping, dancing, or running. What increases the risk? You are more likely to develop this condition if:  You are a woman.  You are 50 years of age or older. What are the signs or symptoms? Symptoms of this condition usually happen when you move your head or your eyes in different directions. Symptoms may start suddenly, and usually last for less than a minute. They include:  Loss of balance and falling.  Feeling like you are spinning or moving.  Feeling like your surroundings are spinning or moving.  Nausea and vomiting.  Blurred vision.  Dizziness.  Involuntary eye movement (nystagmus). Symptoms can be mild and cause only minor problems, or they can be severe and interfere with daily life. Episodes of benign positional vertigo may return (recur) over time. Symptoms may improve over time. How is this diagnosed? This condition may be diagnosed based on:  Your medical history.  Physical exam of the head, neck, and ears.  Tests, such as: ? MRI. ? CT scan. ? Eye movement tests. Your health care provider may ask you to  change positions quickly while he or she watches you for symptoms of benign positional vertigo, such as nystagmus. Eye movement may be tested with a variety of exams that are designed to evaluate or stimulate vertigo. ? An electroencephalogram (EEG). This records electrical activity in your brain. ? Hearing tests. You may be referred to a health care provider who specializes in ear, nose, and throat (ENT) problems (otolaryngologist) or a provider who specializes in disorders of the nervous system (neurologist). How is this treated?  This condition may be treated in a session in which your health care provider moves your head in specific positions to adjust your inner ear back to normal. Treatment for this condition may take several sessions. Surgery may be needed in severe cases, but this is rare. In some cases, benign positional vertigo may resolve on its own in 2-4 weeks. Follow these instructions at home: Safety  Move slowly. Avoid sudden body or head movements or certain positions, as told by your health care provider.  Avoid driving until your health care provider says it is safe for you to do so.  Avoid operating heavy machinery until your health care provider says it is safe for you to do so.  Avoid doing any tasks that would be dangerous to you or others if vertigo occurs.  If you have trouble walking or keeping your balance, try using a cane for stability. If you feel dizzy or unstable, sit down right away.  Return to your normal activities as told by your health care provider. Ask your   health care provider what activities are safe for you. General instructions  Take over-the-counter and prescription medicines only as told by your health care provider.  Drink enough fluid to keep your urine pale yellow.  Keep all follow-up visits as told by your health care provider. This is important. Contact a health care provider if:  You have a fever.  Your condition gets worse or you  develop new symptoms.  Your family or friends notice any behavioral changes.  You have nausea or vomiting that gets worse.  You have numbness or a "pins and needles" sensation. Get help right away if you:  Have difficulty speaking or moving.  Are always dizzy.  Faint.  Develop severe headaches.  Have weakness in your legs or arms.  Have changes in your hearing or vision.  Develop a stiff neck.  Develop sensitivity to light. Summary  Vertigo is the feeling that you or your surroundings are moving when they are not. Benign positional vertigo is the most common form of vertigo.  The cause of this condition is not known. It may be caused by a disturbance in an area of the inner ear that helps your brain to sense movement and balance.  Symptoms include loss of balance and falling, feeling that you or your surroundings are moving, nausea and vomiting, and blurred vision.  This condition can be diagnosed based on symptoms, physical exam, and other tests, such as MRI, CT scan, eye movement tests, and hearing tests.  Follow safety instructions as told by your health care provider. You will also be told when to contact your health care provider in case of problems. This information is not intended to replace advice given to you by your health care provider. Make sure you discuss any questions you have with your health care provider. Document Released: 08/19/2006 Document Revised: 04/22/2018 Document Reviewed: 04/22/2018 Elsevier Interactive Patient Education  2019 [employer].

## 2019-01-26 NOTE — Progress Notes (Signed)
Preoperative Dx: hyperpigmentation  Postoperative Dx:  same  Procedure: laser to face and forehead   Anesthesia: none  Description of Procedure:  Risks and complications were explained to the patient. Consent was confirmed and signed. Time out was called and all information was confirmed to be correct. The area  area was prepped with alcohol and wiped dry. The V555 laser was set at 9.2 Joules. The face and forehead was lasered. The patient tolerated the procedure well and there were no complications. The patient is to follow up in 4 weeks.

## 2019-01-26 NOTE — Progress Notes (Signed)
Subjective   CC:  Chief Complaint  Patient presents with  . Sinusitis    Started over 1 week ago.. Uses Neti pot, and OTC medications. Dizziness/vertigo worse symptom   Same day acute visit; PCP not available. New pt to me. Chart reviewed.   HPI: Lynn Campos is a 48 y.o. female who presents to the office today to address the problems listed above in the chief complaint.  Patient reports 1 week history of illness, started with URI symptoms, fevers, body aches.  Has progressed to thick sinus drainage with pressure, headache, dizziness with head movement and associated nausea without abdominal pain, vomiting or diarrhea.  No longer with fevers.  Has had sinus infections in the past, feels similar.  Dizziness is new.  Only with head movement.  No other neurologic symptoms.  No diplopia or dysarthria.  No history of hypertension, felt clammy and nauseous after walking into the office when blood pressure was checked.  I reviewed the patients updated PMH, FH, and SocHx.    Patient Active Problem List   Diagnosis Date Noted  . Encounter for chronic pain management 10/05/2018  . Cervicalgia 06/29/2018  . Flu-like symptoms 12/05/2015  . Sore throat 04/02/2015  . Screening for malignant neoplasm of cervix 09/20/2013  . Physical exam 09/20/2013  . Abdominal pain, epigastric 09/08/2013  . S/P cervical discectomy 06/25/2013  . Trapezius muscle spasm 12/11/2012  . Shoulder pain 12/11/2012  . Skin tag 11/19/2012  . Keratosis 11/19/2012  . 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 Cough 03/11/2011  . Anxiety state 08/14/2010  . ELEVATED BP W/O HYPERTENSION 08/04/2009  . ROSACEA 07/25/2009  . MITRAL REGURGITATION 02/18/2009  . GERD 02/18/2009  . LUPUS 02/18/2009  . PALPITATIONS 02/18/2009   Current Meds  Medication Sig  . albuterol (PROVENTIL HFA) 108 (90 Base) MCG/ACT inhaler Inhale 2 puffs into the lungs every 4 (four) hours as needed for wheezing.  Marland Kitchen ALPRAZolam (XANAX) 0.25 MG tablet TAKE 1  TABLET(0.25 MG) BY MOUTH TWICE DAILY AS NEEDED  . beclomethasone (QVAR REDIHALER) 80 MCG/ACT inhaler Inhale 2 puffs into the lungs 2 (two) times daily.  . cyclobenzaprine (FLEXERIL) 10 MG tablet Take 1 tablet (10 mg total) by mouth daily as needed for muscle spasms.  Marland Kitchen EPINEPHrine 0.3 mg/0.3 mL IJ SOAJ injection Inject 0.3 mLs (0.3 mg total) into the muscle once.  . fluticasone (FLONASE) 50 MCG/ACT nasal spray Place 2 sprays into both nostrils daily.  Marland Kitchen oxyCODONE-acetaminophen (PERCOCET/ROXICET) 5-325 MG tablet Take 1 tablet by mouth every 4 (four) hours as needed.  Marland Kitchen UNABLE TO FIND Med Name: CBD tablets patient takes one at night    Review of Systems: Cardiovascular: negative for chest pain Respiratory: negative for SOB or persistent cough Gastrointestinal: negative for abdominal pain Genitourinary: negative for dysuria or gross hematuria  Objective  Vitals: BP (!) 144/100   Pulse 97   Temp 98.7 F (37.1 C) (Oral)   Resp 16   Ht 5\' 4"  (1.626 m)   Wt 183 lb 12.8 oz (83.4 kg)   LMP 01/01/2019 (Approximate)   SpO2 98%   BMI 31.55 kg/m  General: no acute distress nontoxic-appearing but moves slowly to exam table Psych:  Alert and oriented, normal mood and affect HEENT:  Normocephalic, atraumatic, TMs with serous effusions or retraction w/o erythema, nasal mucosa is red with purulent drainage, tender maxillary sinus present, OP mild erythematous w/o eudate, supple neck with LAD Cardiovascular:  RRR without murmur or gallop. no peripheral edema Respiratory:  Good breath  sounds bilaterally, CTAB with normal respiratory effort Skin:  Warm, no rashes Neurologic:   Mental status is normal. normal gait, positive dizziness with head movement.  No nystagmus  Assessment  1. Acute non-recurrent maxillary sinusitis   2. Benign paroxysmal positional vertigo, unspecified laterality      Plan    Sinusitis: History and exam is most consistent with bacterial sinus infection.  Etiology and  prognosis discussed with patient.  Recommend antibiotics as ordered below.  Patient to complete course of antibiotics, use supportive medications like mucolytics and decongestants as needed.  May use Tylenol or Advil if needed.  Symptoms should improve over the next 2 weeks.  Patient will return or call if symptoms persist or worsen.  Educated regarding BPPV: Treatment recommendations discussed detail.  Safety recommendations given.  Meclizine to use as needed.  Follow up: PRN   Commons side effects, risks, benefits, and alternatives for medications and treatment plan prescribed today were discussed, and the patient expressed understanding of the given instructions. Patient is instructed to call or message via MyChart if he/she has any questions or concerns regarding our treatment plan. No barriers to understanding were identified. We discussed Red Flag symptoms and signs in detail. Patient expressed understanding regarding what to do in case of urgent or emergency type symptoms.   Medication list was reconciled, printed and provided to the patient in AVS. Patient instructions and summary information was reviewed with the patient as documented in the AVS. This note was prepared with assistance of Dragon voice recognition software. Occasional wrong-word or sound-a-like substitutions may have occurred due to the inherent limitations of voice recognition software  No orders of the defined types were placed in this encounter.  Meds ordered this encounter  Medications  . azithromycin (ZITHROMAX) 250 MG tablet    Sig: Take 2 tabs today, then 1 tab daily for 4 days    Dispense:  1 each    Refill:  0  . meclizine (ANTIVERT) 25 MG tablet    Sig: Take 1 tablet (25 mg total) by mouth 3 (three) times daily as needed for dizziness.    Dispense:  30 tablet    Refill:  0

## 2019-02-03 ENCOUNTER — Other Ambulatory Visit: Payer: Self-pay | Admitting: Family Medicine

## 2019-02-04 ENCOUNTER — Other Ambulatory Visit: Payer: Self-pay | Admitting: Family Medicine

## 2019-02-04 NOTE — Telephone Encounter (Signed)
Last OV 01/26/19 (dr. Mardelle Matte) Oxycodone last filled 12/25/18 #30 with 0 Flexeril last filled 12/25/18 #30 with 0

## 2019-02-05 MED ORDER — CYCLOBENZAPRINE HCL 10 MG PO TABS: 10.0000 mg | ORAL_TABLET | Freq: Every day | ORAL | 0 refills | Status: DC | PRN

## 2019-02-05 MED ORDER — OXYCODONE-ACETAMINOPHEN 5-325 MG PO TABS: 1.0000 | ORAL_TABLET | ORAL | 0 refills | Status: DC | PRN

## 2019-02-05 NOTE — Telephone Encounter (Signed)
Last OV 01/26/19 Alprazolam last filled 09/18/18 #30 with 3

## 2019-03-12 ENCOUNTER — Other Ambulatory Visit: Payer: Self-pay | Admitting: Family Medicine

## 2019-03-16 ENCOUNTER — Other Ambulatory Visit: Payer: Self-pay | Admitting: Family Medicine

## 2019-03-16 NOTE — Telephone Encounter (Signed)
Indication for chronic opioid: chronic neck pain s/p disectomy Medication and dose: Oxycodone 5/325 mg # pills per month: #30 on 02/05/19 Last UDS date: 06/29/18 Opioid Treatment Agreement signed (Y/N): 08/25/2017  Opioid Treatment Agreement last reviewed with patient:   [facility] reviewed this encounter (include red flags):

## 2019-03-17 MED ORDER — OXYCODONE-ACETAMINOPHEN 5-325 MG PO TABS: 1.0000 | ORAL_TABLET | ORAL | 0 refills | Status: DC | PRN

## 2019-03-29 ENCOUNTER — Encounter: Payer: Self-pay | Admitting: Family Medicine

## 2019-03-29 ENCOUNTER — Other Ambulatory Visit: Payer: Self-pay

## 2019-03-29 ENCOUNTER — Ambulatory Visit (INDEPENDENT_AMBULATORY_CARE_PROVIDER_SITE_OTHER): Payer: BC Managed Care – PPO | Admitting: Family Medicine

## 2019-03-29 DIAGNOSIS — Z0000 Encounter for general adult medical examination without abnormal findings: Secondary | ICD-10-CM | POA: Diagnosis not present

## 2019-03-29 DIAGNOSIS — E669 Obesity, unspecified: Secondary | ICD-10-CM

## 2019-03-29 DIAGNOSIS — Z1211 Encounter for screening for malignant neoplasm of colon: Secondary | ICD-10-CM

## 2019-03-29 NOTE — Progress Notes (Signed)
Virtual Visit via Video   I connected with patient on 03/29/19 at  9:20 AM EDT by a video enabled telemedicine application and verified that I am speaking with the correct person using two identifiers.  Location patient: Home Location provider: [facility], Office Persons participating in the virtual visit: Patient, Provider, CMA (Jess B)  I discussed the limitations of evaluation and management by telemedicine and the availability of in person appointments. The patient expressed understanding and agreed to proceed.  Subjective:   HPI:   CPE- UTD on mammo, due for pap upcoming.  Due for colonoscopy this year.  UTD on immunizations.  'No complaints'.    ROS:  Patient reports no vision/ hearing changes, adenopathy,fever, weight change,  persistant/recurrent hoarseness , swallowing issues, chest pain, palpitations, edema, persistant/recurrent cough, hemoptysis, dyspnea (rest/exertional/paroxysmal nocturnal), gastrointestinal bleeding (melena, rectal bleeding), abdominal pain, significant heartburn, bowel changes, GU symptoms (dysuria, hematuria, incontinence), Gyn symptoms (abnormal  bleeding, pain),  syncope, focal weakness, memory loss, numbness & tingling, skin/hair/nail changes, abnormal bruising or bleeding, anxiety, or depression.   Patient Active Problem List   Diagnosis Date Noted  . Encounter for chronic pain management 10/05/2018  . Cervicalgia 06/29/2018  . Flu-like symptoms 12/05/2015  . Sore throat 04/02/2015  . Screening for malignant neoplasm of cervix 09/20/2013  . Physical exam 09/20/2013  . Abdominal pain, epigastric 09/08/2013  . S/P cervical discectomy 06/25/2013  . Trapezius muscle spasm 12/11/2012  . Shoulder pain 12/11/2012  . Skin tag 11/19/2012  . Keratosis 11/19/2012  . 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 Cough 03/11/2011  . Anxiety state 08/14/2010  . ELEVATED BP W/O HYPERTENSION 08/04/2009  . ROSACEA 07/25/2009  . MITRAL REGURGITATION 02/18/2009   . GERD 02/18/2009  . LUPUS 02/18/2009  . PALPITATIONS 02/18/2009    Social History   Tobacco Use  . Smoking status: Never Smoker  . Smokeless tobacco: Never Used  Substance Use Topics  . Alcohol use: No    Current Outpatient Medications:  .  albuterol (PROVENTIL HFA) 108 (90 Base) MCG/ACT inhaler, Inhale 2 puffs into the lungs every 4 (four) hours as needed for wheezing., Disp: 1 Inhaler, Rfl: 12 .  ALPRAZolam (XANAX) 0.25 MG tablet, TAKE 1 TABLET(0.25 MG) BY MOUTH TWICE DAILY AS NEEDED, Disp: 30 tablet, Rfl: 3 .  beclomethasone (QVAR REDIHALER) 80 MCG/ACT inhaler, Inhale 2 puffs into the lungs 2 (two) times daily., Disp: 1 Inhaler, Rfl: 3 .  cyclobenzaprine (FLEXERIL) 10 MG tablet, TAKE 1 TABLET(10 MG) BY MOUTH DAILY AS NEEDED FOR MUSCLE SPASMS, Disp: 30 tablet, Rfl: 0 .  EPINEPHrine 0.3 mg/0.3 mL IJ SOAJ injection, Inject 0.3 mLs (0.3 mg total) into the muscle once., Disp: 1 Device, Rfl: 2 .  fluticasone (FLONASE) 50 MCG/ACT nasal spray, Place 2 sprays into both nostrils daily., Disp: 16 g, Rfl: 0 .  meclizine (ANTIVERT) 25 MG tablet, Take 1 tablet (25 mg total) by mouth 3 (three) times daily as needed for dizziness., Disp: 30 tablet, Rfl: 0 .  oxyCODONE-acetaminophen (PERCOCET/ROXICET) 5-325 MG tablet, Take 1 tablet by mouth every 4 (four) hours as needed., Disp: 30 tablet, Rfl: 0 .  UNABLE TO FIND, Med Name: CBD tablets patient takes one at night, Disp: , Rfl:   Allergies  Allergen Reactions  . Flovent [Fluticasone Propionate]     Difficulty breathing. No issues with Qvar Rxed by Dr Shelle Iron  . Codeine     REACTION: vomiting    Objective:   There were no vitals taken for this visit.  AAOx3,  NAD NCAT, EOMI No obvious CN deficits Coloring WNL Pt is able to speak clearly, coherently without shortness of breath or increased work of breathing.  Thought process is linear.  Mood is appropriate.   Assessment and Plan:   Physical exam- pt's exam via video WNL.  UTD on mammo.   Due for colonoscopy- referral placed.  Will need pap later this year or next.  Check labs.  Anticipatory guidance provided.   Obesity- ongoing issue for pt.  Stressed need for healthy diet and regular exercise.  Check labs to risk stratify.  Will follow.   Neena RhymesKatherine Jaonna Word, MD 03/29/2019

## 2019-03-29 NOTE — Progress Notes (Signed)
I have discussed the procedure for the virtual visit with the patient who has given consent to proceed with assessment and treatment.   Jessica L Brodmerkel, CMA     

## 2019-04-05 ENCOUNTER — Other Ambulatory Visit (INDEPENDENT_AMBULATORY_CARE_PROVIDER_SITE_OTHER): Payer: BC Managed Care – PPO

## 2019-04-05 DIAGNOSIS — E669 Obesity, unspecified: Secondary | ICD-10-CM | POA: Diagnosis not present

## 2019-04-05 LAB — CBC WITH DIFFERENTIAL/PLATELET
Basophils Absolute: 0 10*3/uL (ref 0.0–0.1)
Basophils Relative: 0.6 % (ref 0.0–3.0)
Eosinophils Absolute: 0.1 10*3/uL (ref 0.0–0.7)
Eosinophils Relative: 3.5 % (ref 0.0–5.0)
HCT: 38.3 % (ref 36.0–46.0)
Hemoglobin: 12.8 g/dL (ref 12.0–15.0)
Lymphocytes Relative: 27.1 % (ref 12.0–46.0)
Lymphs Abs: 1.2 10*3/uL (ref 0.7–4.0)
MCHC: 33.5 g/dL (ref 30.0–36.0)
MCV: 89.7 fl (ref 78.0–100.0)
Monocytes Absolute: 0.3 10*3/uL (ref 0.1–1.0)
Monocytes Relative: 7.2 % (ref 3.0–12.0)
Neutro Abs: 2.6 10*3/uL (ref 1.4–7.7)
Neutrophils Relative %: 61.6 % (ref 43.0–77.0)
Platelets: 235 10*3/uL (ref 150.0–400.0)
RBC: 4.27 Mil/uL (ref 3.87–5.11)
RDW: 14 % (ref 11.5–15.5)
WBC: 4.3 10*3/uL (ref 4.0–10.5)

## 2019-04-05 LAB — LIPID PANEL
Cholesterol: 195 mg/dL (ref 0–200)
HDL: 55 mg/dL (ref >39.00)
LDL Cholesterol: 112 mg/dL — ABNORMAL HIGH (ref 0–99)
NonHDL: 140.39
Total CHOL/HDL Ratio: 4
Triglycerides: 140 mg/dL (ref 0.0–149.0)
VLDL: 28 mg/dL (ref 0.0–40.0)

## 2019-04-05 LAB — BASIC METABOLIC PANEL
BUN: 11 mg/dL (ref 6–23)
CO2: 29 mEq/L (ref 19–32)
Calcium: 8.6 mg/dL (ref 8.4–10.5)
Chloride: 105 mEq/L (ref 96–112)
Creatinine, Ser: 0.68 mg/dL (ref 0.40–1.20)
GFR: 91.54 mL/min (ref >60.00)
Glucose, Bld: 93 mg/dL (ref 70–99)
Potassium: 4.8 mEq/L (ref 3.5–5.1)
Sodium: 141 mEq/L (ref 135–145)

## 2019-04-05 LAB — HEPATIC FUNCTION PANEL
ALT: 10 U/L (ref 0–35)
AST: 12 U/L (ref 0–37)
Albumin: 4.2 g/dL (ref 3.5–5.2)
Alkaline Phosphatase: 46 U/L (ref 39–117)
Bilirubin, Direct: 0.1 mg/dL (ref 0.0–0.3)
Total Bilirubin: 0.4 mg/dL (ref 0.2–1.2)
Total Protein: 6.5 g/dL (ref 6.0–8.3)

## 2019-04-05 LAB — TSH: TSH: 0.95 u[IU]/mL (ref 0.35–4.50)

## 2019-04-06 ENCOUNTER — Encounter: Payer: Self-pay | Admitting: General Practice

## 2019-04-15 ENCOUNTER — Other Ambulatory Visit: Payer: Self-pay | Admitting: Family Medicine

## 2019-04-20 ENCOUNTER — Other Ambulatory Visit: Payer: Self-pay | Admitting: Family Medicine

## 2019-04-21 MED ORDER — OXYCODONE-ACETAMINOPHEN 5-325 MG PO TABS: 1.0000 | ORAL_TABLET | ORAL | 0 refills | Status: DC | PRN

## 2019-04-21 NOTE — Telephone Encounter (Signed)
Last OV 03/29/19 Oxycodone last filled 03/17/19 #30 with 0

## 2019-04-28 ENCOUNTER — Encounter: Payer: Self-pay | Admitting: Gastroenterology

## 2019-05-12 IMAGING — MG DIGITAL SCREENING BILATERAL MAMMOGRAM WITH TOMO AND CAD
8 series · 8 of 24 positions shown · non-contrast
Comparison: Previous exam(s).

CLINICAL DATA: Screening.

EXAM:
DIGITAL SCREENING BILATERAL MAMMOGRAM WITH TOMO AND CAD

[L CC synth-2D]
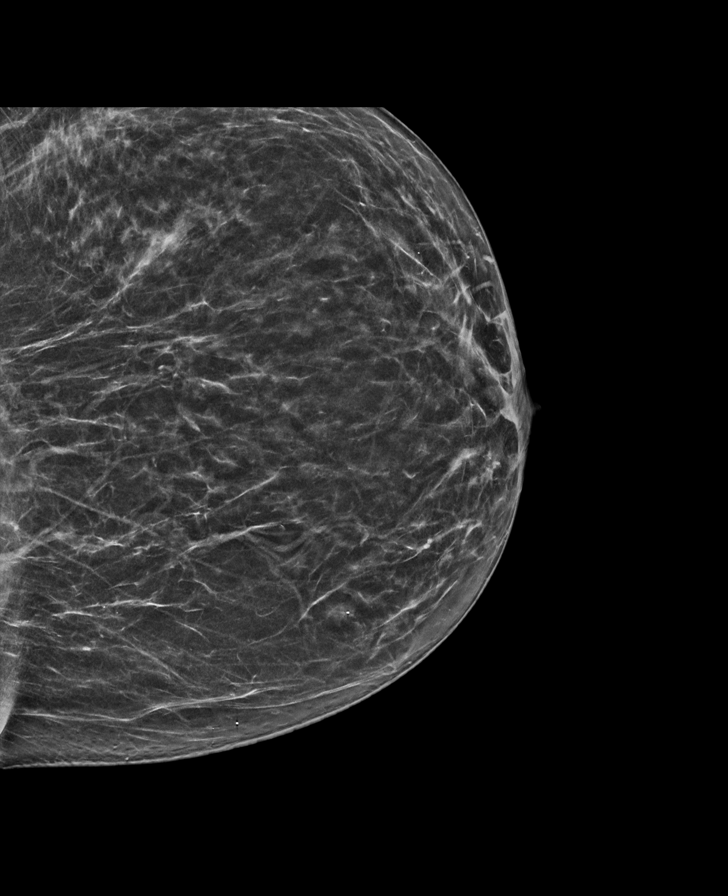

[R MLO synth-2D]
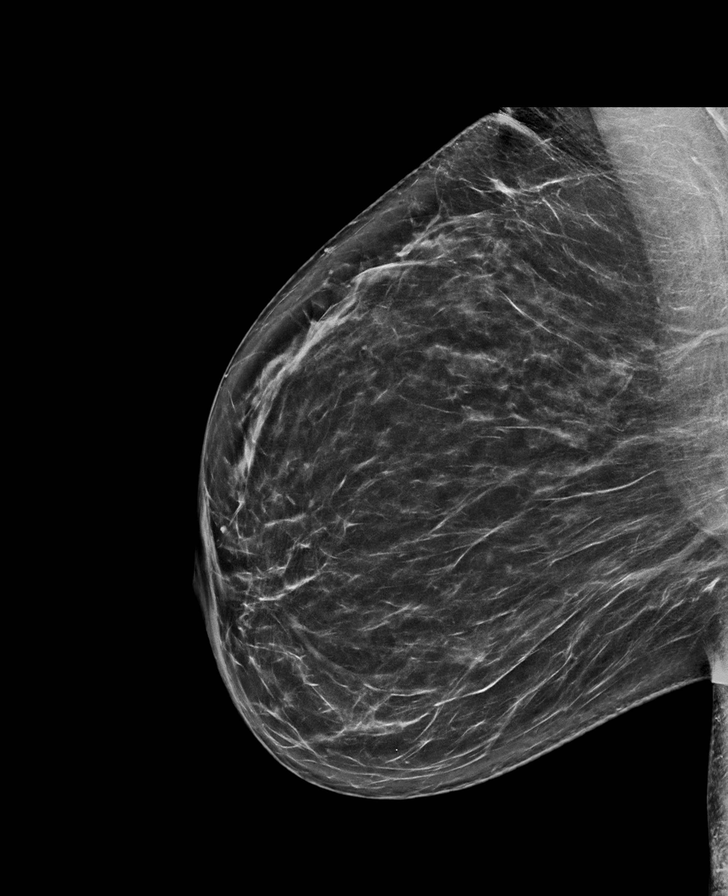

[R CC synth-2D]
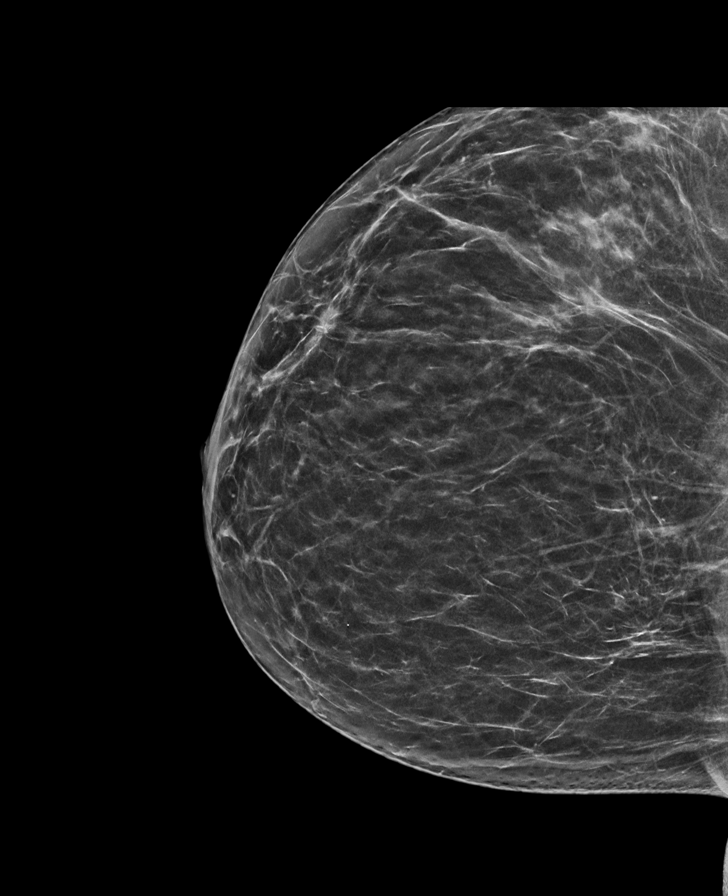

[L MLO synth-2D]
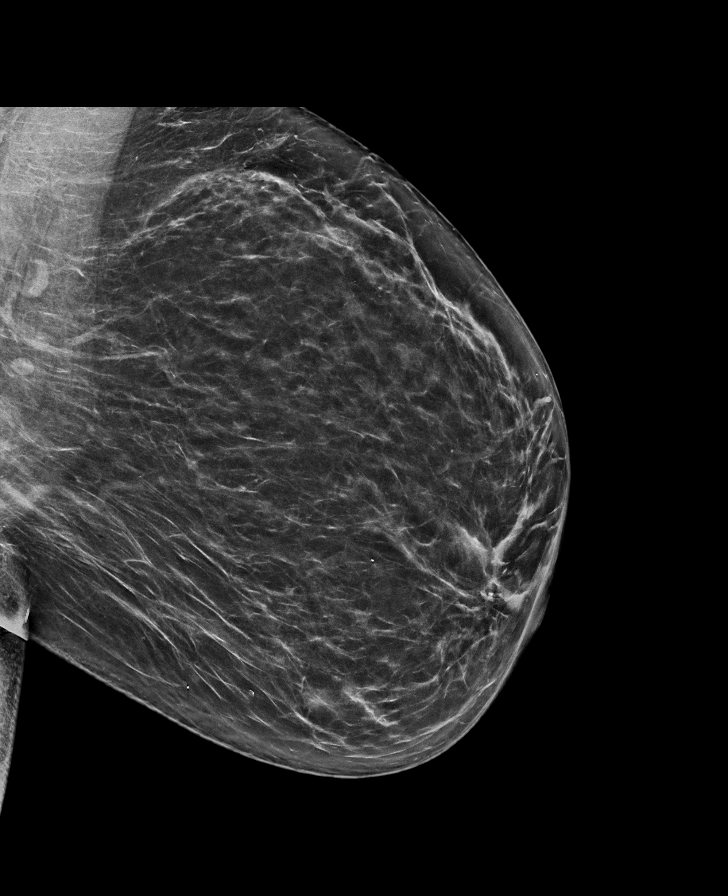

[R MLO tomo · tomo slice 41/81.0]
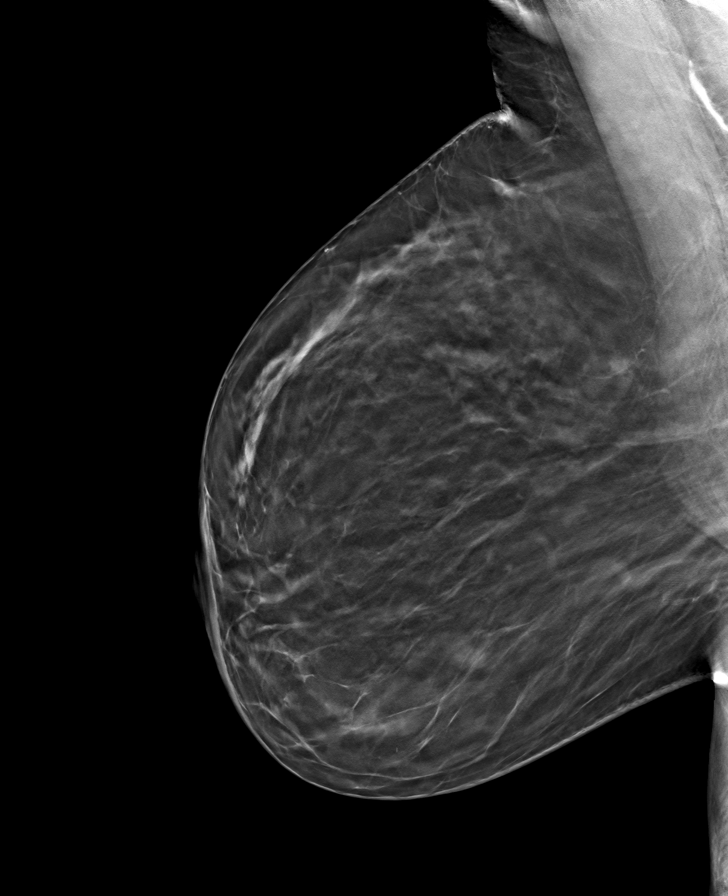

[R CC tomo · tomo slice 33/66.0]
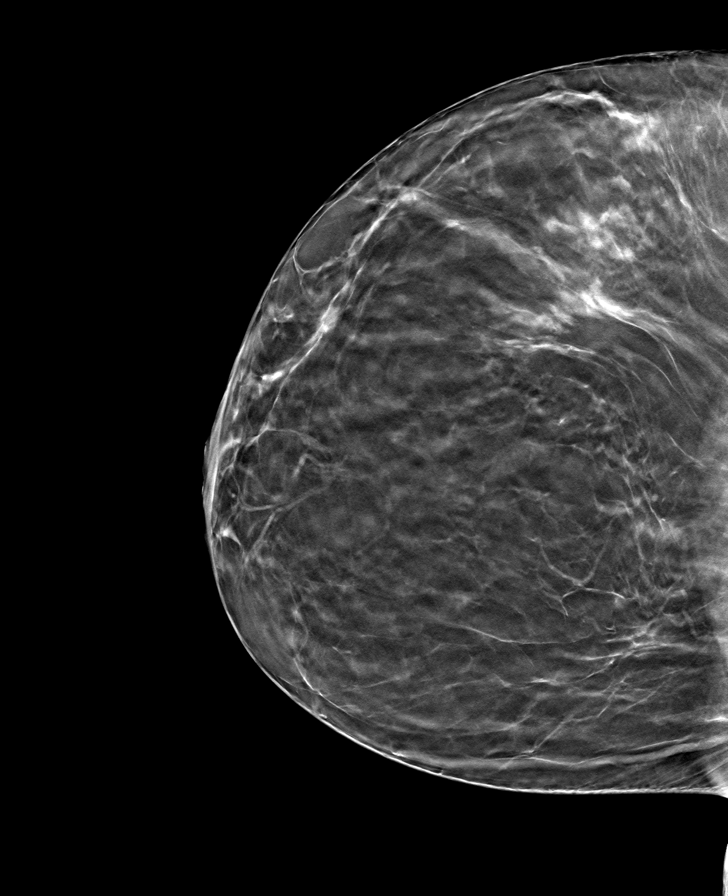

[L MLO tomo · tomo slice 41/80.0]
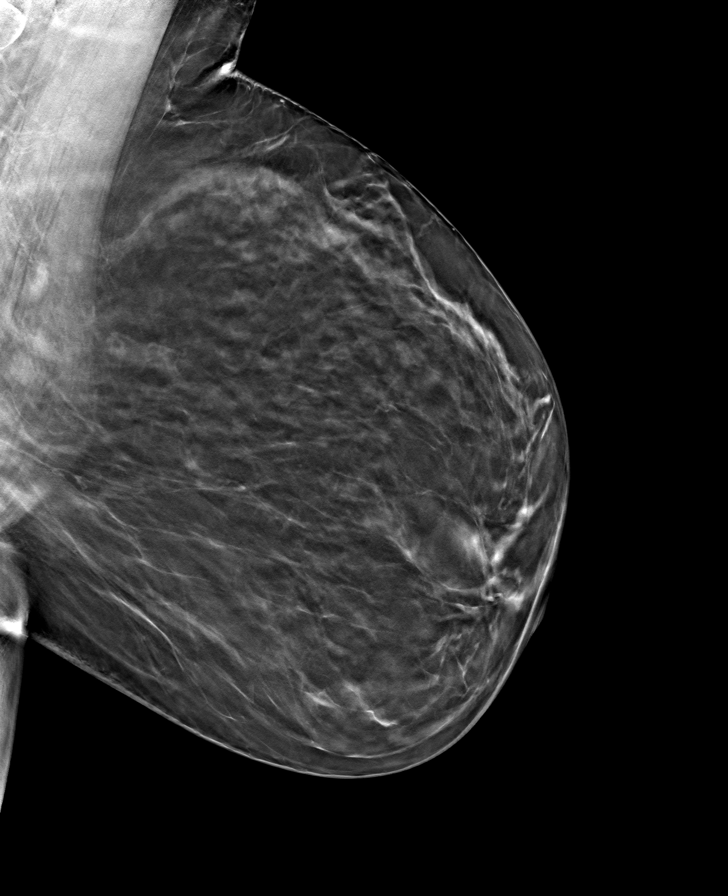

[L CC tomo · tomo slice 33/66.0]
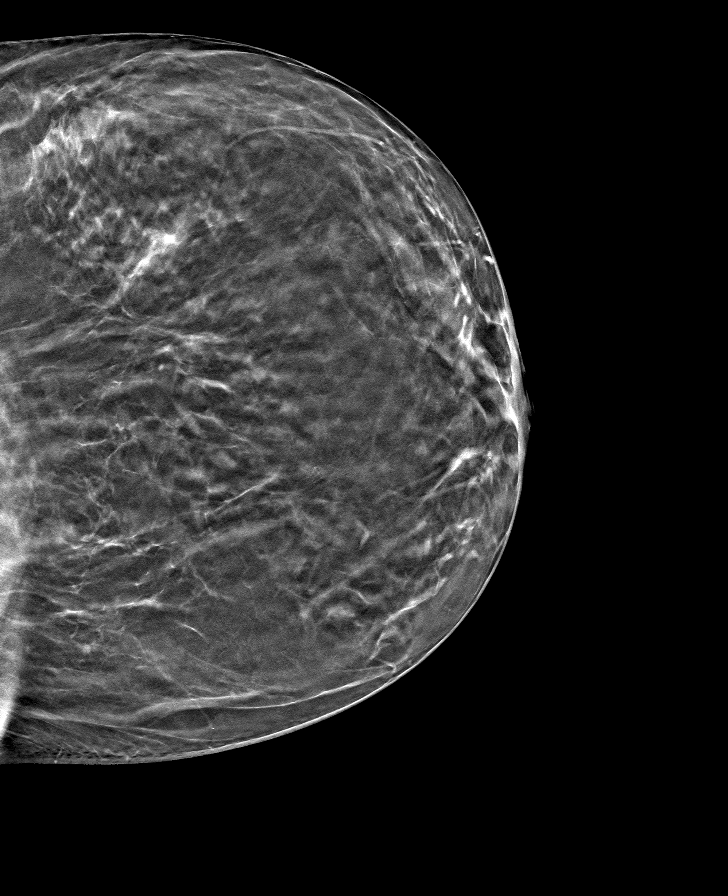

[8 of 24 positions shown; findings below may reference images not displayed]

ACR Breast Density Category b: There are scattered areas of
fibroglandular density.
FINDINGS: There are no findings suspicious for malignancy. Images were
processed with CAD.
IMPRESSION: No mammographic evidence of malignancy. A result letter of this
screening mammogram will be mailed directly to the patient.

RECOMMENDATION:
Screening mammogram in one year. (Code:[id number])

BI-RADS CATEGORY  1: Negative.

## 2019-05-18 ENCOUNTER — Other Ambulatory Visit: Payer: Self-pay | Admitting: Family Medicine

## 2019-05-18 NOTE — Telephone Encounter (Signed)
Last OV 03/29/19 Oxycodone last filled 04/21/19 #30 with 0

## 2019-05-19 ENCOUNTER — Encounter: Payer: BC Managed Care – PPO | Admitting: Family Medicine

## 2019-05-19 ENCOUNTER — Other Ambulatory Visit: Payer: Self-pay | Admitting: Physician Assistant

## 2019-05-19 MED ORDER — OXYCODONE-ACETAMINOPHEN 5-325 MG PO TABS: 1.0000 | ORAL_TABLET | ORAL | 0 refills | Status: DC | PRN

## 2019-05-20 NOTE — Telephone Encounter (Signed)
Will defer further refills of patient's medications to PCP  

## 2019-06-04 ENCOUNTER — Ambulatory Visit (AMBULATORY_SURGERY_CENTER): Payer: Self-pay | Admitting: *Deleted

## 2019-06-04 ENCOUNTER — Other Ambulatory Visit: Payer: Self-pay

## 2019-06-04 VITALS — Ht 64.0 in | Wt 180.0 lb

## 2019-06-04 DIAGNOSIS — Z1211 Encounter for screening for malignant neoplasm of colon: Secondary | ICD-10-CM

## 2019-06-04 MED ORDER — SUPREP BOWEL PREP KIT 17.5-3.13-1.6 GM/177ML PO SOLN: ORAL | 0 refills | Status: DC

## 2019-06-04 NOTE — Progress Notes (Signed)
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  Pt's previsit is done over the phone and all paperwork (prep instructions, blank consent form to just read over, pre-procedure acknowledgement form and stamped envelope) sent to patient.  No egg or soy allergy.  Pt has been told her that she is difficult to wake up after sedation  Pt is aware that care partner will wait in the car during procedure; if they feel like they will be too hot to wait in the car; they may wait in the lobby.  We want them to wear a mask (we do not have any that we can provide them), practice social distancing, and we will check their temperatures when they get here.  I did remind patient that their care partner needs to stay in the parking lot the entire time. Pt will wear mask into building.   No home oxygen used or hx of sleep apnea  No diet medications taken  $15 off Suprep coupon given

## 2019-06-08 ENCOUNTER — Other Ambulatory Visit: Payer: Self-pay | Admitting: Family Medicine

## 2019-06-08 NOTE — Telephone Encounter (Signed)
Last OV 03/29/19 Alprazolam last filled 02/05/19 #30 with 3

## 2019-06-18 ENCOUNTER — Encounter: Payer: BC Managed Care – PPO | Admitting: Gastroenterology

## 2019-06-22 ENCOUNTER — Other Ambulatory Visit: Payer: Self-pay | Admitting: Family Medicine

## 2019-06-22 ENCOUNTER — Encounter: Payer: BC Managed Care – PPO | Admitting: Gastroenterology

## 2019-06-22 NOTE — Telephone Encounter (Signed)
Last OV 03/29/19 Oxycodone last filled 05/19/19 #30 with 0

## 2019-06-23 MED ORDER — OXYCODONE-ACETAMINOPHEN 5-325 MG PO TABS: 1.0000 | ORAL_TABLET | ORAL | 0 refills | Status: DC | PRN

## 2019-07-05 ENCOUNTER — Encounter: Payer: Self-pay | Admitting: Family Medicine

## 2019-07-14 ENCOUNTER — Encounter: Payer: Self-pay | Admitting: Family Medicine

## 2019-07-22 ENCOUNTER — Other Ambulatory Visit: Payer: Self-pay | Admitting: Family Medicine

## 2019-07-22 MED ORDER — CYCLOBENZAPRINE HCL 10 MG PO TABS: 10.0000 mg | ORAL_TABLET | Freq: Three times a day (TID) | ORAL | 3 refills | Status: DC | PRN

## 2019-07-22 MED ORDER — OXYCODONE-ACETAMINOPHEN 5-325 MG PO TABS: 1.0000 | ORAL_TABLET | ORAL | 0 refills | Status: DC | PRN

## 2019-07-22 NOTE — Telephone Encounter (Signed)
Last OV 03/29/19 Oxycodone last filled 06/23/19 #30 with 0 Flexeril last filled 06/23/19 #30 with 0

## 2019-07-30 ENCOUNTER — Encounter: Payer: Self-pay | Admitting: Family Medicine

## 2019-08-03 ENCOUNTER — Other Ambulatory Visit: Payer: Self-pay

## 2019-08-03 DIAGNOSIS — Z20822 Contact with and (suspected) exposure to covid-19: Secondary | ICD-10-CM

## 2019-08-05 LAB — NOVEL CORONAVIRUS, NAA: SARS-CoV-2, NAA: NOT DETECTED

## 2019-08-19 ENCOUNTER — Other Ambulatory Visit: Payer: Self-pay | Admitting: Family Medicine

## 2019-08-20 MED ORDER — OXYCODONE-ACETAMINOPHEN 5-325 MG PO TABS: 1.0000 | ORAL_TABLET | ORAL | 0 refills | Status: DC | PRN

## 2019-08-20 NOTE — Telephone Encounter (Signed)
Last OV 03/29/19 Oxycodone last filled 07/22/19 #30 with 0

## 2019-09-17 ENCOUNTER — Other Ambulatory Visit: Payer: Self-pay | Admitting: Family Medicine

## 2019-09-17 MED ORDER — OXYCODONE-ACETAMINOPHEN 5-325 MG PO TABS: 1.0000 | ORAL_TABLET | ORAL | 0 refills | Status: DC | PRN

## 2019-09-17 NOTE — Telephone Encounter (Signed)
Last OV 03/29/19 Oxycodone last filled 08/20/19 #30 with 0

## 2019-09-27 ENCOUNTER — Encounter: Payer: Self-pay | Admitting: Family Medicine

## 2019-09-27 ENCOUNTER — Telehealth: Payer: Self-pay

## 2019-09-27 NOTE — Telephone Encounter (Signed)
Questions for Screening COVID-19  Symptom onset: None  Travel or Contacts: None  During this illness, did/does the patient experience any of the following symptoms? Fever >100.60F []   Yes [x]   No []   Unknown Subjective fever (felt feverish) []   Yes [x]   No []   Unknown Chills []   Yes [x]   No []   Unknown Muscle aches (myalgia) []   Yes [x]   No []   Unknown Runny nose (rhinorrhea) []   Yes [x]   No []   Unknown Sore throat []   Yes [x]   No []   Unknown Cough (new onset or worsening of chronic cough) []   Yes [x]   No []   Unknown Shortness of breath (dyspnea) []   Yes [x]   No []   Unknown Nausea or vomiting []   Yes [x]   No []   Unknown Headache []   Yes [x]   No []   Unknown Abdominal pain  []   Yes [x]   No []   Unknown Diarrhea (?3 loose/looser than normal stools/24hr period) []   Yes [x]   No []   Unknown Other, specify:  Patient risk factors: Smoker? []   Current []   Former []   Never If female, currently pregnant? []   Yes []   No  Patient Active Problem List   Diagnosis Date Noted  . Obesity (BMI 30-39.9) 03/29/2019  . Encounter for chronic pain management 10/05/2018  . Cervicalgia 06/29/2018  . Flu-like symptoms 12/05/2015  . Sore throat 04/02/2015  . Screening for malignant neoplasm of cervix 09/20/2013  . Physical exam 09/20/2013  . Abdominal pain, epigastric 09/08/2013  . S/P cervical discectomy 06/25/2013  . Trapezius muscle spasm 12/11/2012  . Shoulder pain 12/11/2012  . Skin tag 11/19/2012  . Keratosis 11/19/2012  . 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 Cough 03/11/2011  . Anxiety state 08/14/2010  . ELEVATED BP W/O HYPERTENSION 08/04/2009  . ROSACEA 07/25/2009  . MITRAL REGURGITATION 02/18/2009  . GERD 02/18/2009  . LUPUS 02/18/2009  . PALPITATIONS 02/18/2009    Plan:  []   High risk for COVID-19 with red flags go to ED (with CP, SOB, weak/lightheaded, or fever > 101.5). Call ahead.  []   High risk for COVID-19 but stable. Inform provider and coordinate time for [facility] visit.   []   No  red flags but URI signs or symptoms okay for [facility] visit.

## 2019-09-28 ENCOUNTER — Other Ambulatory Visit: Payer: Self-pay

## 2019-09-28 ENCOUNTER — Ambulatory Visit (INDEPENDENT_AMBULATORY_CARE_PROVIDER_SITE_OTHER): Payer: BC Managed Care – PPO | Admitting: Behavioral Health

## 2019-09-28 ENCOUNTER — Encounter: Payer: Self-pay | Admitting: Family Medicine

## 2019-09-28 DIAGNOSIS — Z23 Encounter for immunization: Secondary | ICD-10-CM

## 2019-09-28 NOTE — Progress Notes (Signed)
Patient presents in clinic today for Influenza vaccination. IM injection was given in the right deltoid. Patient tolerated the injection well. No signs or symptoms of a reaction were noted prior to patient leaving the nurse visit. 

## 2019-10-18 ENCOUNTER — Other Ambulatory Visit: Payer: Self-pay | Admitting: Family Medicine

## 2019-10-18 NOTE — Telephone Encounter (Signed)
Last OV 03/29/19 Oxycodone last filled 09/17/19 #30 with 0

## 2019-10-19 ENCOUNTER — Other Ambulatory Visit: Payer: Self-pay | Admitting: General Practice

## 2019-10-19 MED ORDER — OXYCODONE-ACETAMINOPHEN 5-325 MG PO TABS: 1.0000 | ORAL_TABLET | ORAL | 0 refills | Status: DC | PRN

## 2019-10-19 NOTE — Telephone Encounter (Signed)
Last OV 03/29/19 Alprazolam last filled 06/09/19 #30 with 3

## 2019-10-20 MED ORDER — ALPRAZOLAM 0.25 MG PO TABS: ORAL_TABLET | ORAL | 0 refills | Status: DC

## 2019-10-20 NOTE — Telephone Encounter (Signed)
1 time fill in PCP absence.  

## 2019-10-24 ENCOUNTER — Emergency Department (HOSPITAL_COMMUNITY): Payer: BC Managed Care – PPO

## 2019-10-24 ENCOUNTER — Encounter (HOSPITAL_COMMUNITY): Payer: Self-pay

## 2019-10-24 ENCOUNTER — Emergency Department (HOSPITAL_COMMUNITY)
Admission: EM | Admit: 2019-10-24 | Discharge: 2019-10-24 | Disposition: A | Discharge status: Home / Self Care | Payer: BC Managed Care – PPO | Attending: Emergency Medicine | Admitting: Emergency Medicine

## 2019-10-24 ENCOUNTER — Other Ambulatory Visit: Payer: Self-pay

## 2019-10-24 DIAGNOSIS — N83201 Unspecified ovarian cyst, right side: Secondary | ICD-10-CM | POA: Insufficient documentation

## 2019-10-24 DIAGNOSIS — R109 Unspecified abdominal pain: Secondary | ICD-10-CM | POA: Diagnosis present

## 2019-10-24 DIAGNOSIS — B029 Zoster without complications: Secondary | ICD-10-CM | POA: Diagnosis not present

## 2019-10-24 DIAGNOSIS — N838 Other noninflammatory disorders of ovary, fallopian tube and broad ligament: Secondary | ICD-10-CM

## 2019-10-24 DIAGNOSIS — N888 Other specified noninflammatory disorders of cervix uteri: Secondary | ICD-10-CM | POA: Insufficient documentation

## 2019-10-24 DIAGNOSIS — J45909 Unspecified asthma, uncomplicated: Secondary | ICD-10-CM | POA: Diagnosis not present

## 2019-10-24 DIAGNOSIS — Z79899 Other long term (current) drug therapy: Secondary | ICD-10-CM | POA: Diagnosis not present

## 2019-10-24 DIAGNOSIS — N83291 Other ovarian cyst, right side: Secondary | ICD-10-CM

## 2019-10-24 LAB — COMPREHENSIVE METABOLIC PANEL
ALT: 21 U/L (ref 0–44)
AST: 19 U/L (ref 15–41)
Albumin: 4 g/dL (ref 3.5–5.0)
Alkaline Phosphatase: 47 U/L (ref 38–126)
Anion gap: 8 (ref 5–15)
BUN: 9 mg/dL (ref 6–20)
CO2: 23 mmol/L (ref 22–32)
Calcium: 8.3 mg/dL — ABNORMAL LOW (ref 8.9–10.3)
Chloride: 105 mmol/L (ref 98–111)
Creatinine, Ser: 0.55 mg/dL (ref 0.44–1.00)
GFR calc Af Amer: >60 mL/min (ref >60)
GFR calc non Af Amer: >60 mL/min (ref >60)
Glucose, Bld: 89 mg/dL (ref 70–99)
Potassium: 3.5 mmol/L (ref 3.5–5.1)
Sodium: 136 mmol/L (ref 135–145)
Total Bilirubin: 0.5 mg/dL (ref 0.3–1.2)
Total Protein: 6.8 g/dL (ref 6.5–8.1)

## 2019-10-24 LAB — CBC WITH DIFFERENTIAL/PLATELET
Abs Immature Granulocytes: 0.02 10*3/uL (ref 0.00–0.07)
Basophils Absolute: 0 10*3/uL (ref 0.0–0.1)
Basophils Relative: 0 %
Eosinophils Absolute: 0.2 10*3/uL (ref 0.0–0.5)
Eosinophils Relative: 5 %
HCT: 38.9 % (ref 36.0–46.0)
Hemoglobin: 12.7 g/dL (ref 12.0–15.0)
Immature Granulocytes: 0 %
Lymphocytes Relative: 25 %
Lymphs Abs: 1.3 10*3/uL (ref 0.7–4.0)
MCH: 29.5 pg (ref 26.0–34.0)
MCHC: 32.6 g/dL (ref 30.0–36.0)
MCV: 90.5 fL (ref 80.0–100.0)
Monocytes Absolute: 0.5 10*3/uL (ref 0.1–1.0)
Monocytes Relative: 9 %
Neutro Abs: 3.2 10*3/uL (ref 1.7–7.7)
Neutrophils Relative %: 61 %
Platelets: 226 10*3/uL (ref 150–400)
RBC: 4.3 MIL/uL (ref 3.87–5.11)
RDW: 12.9 % (ref 11.5–15.5)
WBC: 5.3 10*3/uL (ref 4.0–10.5)
nRBC: 0 % (ref 0.0–0.2)

## 2019-10-24 LAB — URINALYSIS, ROUTINE W REFLEX MICROSCOPIC
Bilirubin Urine: NEGATIVE
Glucose, UA: NEGATIVE mg/dL
Hgb urine dipstick: NEGATIVE
Ketones, ur: NEGATIVE mg/dL
Leukocytes,Ua: NEGATIVE
Nitrite: NEGATIVE
Protein, ur: NEGATIVE mg/dL
Specific Gravity, Urine: 1.028 (ref 1.005–1.030)
pH: 5 (ref 5.0–8.0)

## 2019-10-24 LAB — LIPASE, BLOOD: Lipase: 23 U/L (ref 11–51)

## 2019-10-24 LAB — POC URINE PREG, ED: Preg Test, Ur: NEGATIVE

## 2019-10-24 MED ORDER — VALACYCLOVIR HCL 1 G PO TABS: 1000.0000 mg | ORAL_TABLET | Freq: Three times a day (TID) | ORAL | 0 refills | Status: AC

## 2019-10-24 MED ORDER — METHOCARBAMOL 500 MG PO TABS: 500.0000 mg | ORAL_TABLET | Freq: Three times a day (TID) | ORAL | 0 refills | Status: AC

## 2019-10-24 MED ORDER — LIDOCAINE 5 % EX PTCH: 1.0000 | MEDICATED_PATCH | CUTANEOUS | 0 refills | Status: DC

## 2019-10-24 MED ORDER — ONDANSETRON HCL 4 MG/2ML IJ SOLN
4.0000 mg | Freq: Once | INTRAMUSCULAR | Status: AC
  Administered 2019-10-24: 4 mg via INTRAVENOUS
  Filled 2019-10-24: qty 2

## 2019-10-24 MED ORDER — METHOCARBAMOL 500 MG PO TABS
500.0000 mg | ORAL_TABLET | Freq: Once | ORAL | Status: AC
  Administered 2019-10-24: 500 mg via ORAL
  Filled 2019-10-24: qty 1

## 2019-10-24 MED ORDER — MORPHINE SULFATE (PF) 4 MG/ML IV SOLN
4.0000 mg | Freq: Once | INTRAVENOUS | Status: AC
  Administered 2019-10-24: 4 mg via INTRAVENOUS
  Filled 2019-10-24: qty 1

## 2019-10-24 MED ORDER — LIDOCAINE 5 % EX PTCH
1.0000 | MEDICATED_PATCH | CUTANEOUS | Status: DC
  Filled 2019-10-24: qty 1

## 2019-10-24 MED ORDER — HYDROMORPHONE HCL 1 MG/ML IJ SOLN
0.5000 mg | Freq: Once | INTRAMUSCULAR | Status: AC
  Administered 2019-10-24: 0.5 mg via INTRAVENOUS
  Filled 2019-10-24: qty 1

## 2019-10-24 MED ORDER — VALACYCLOVIR HCL 500 MG PO TABS
1000.0000 mg | ORAL_TABLET | Freq: Once | ORAL | Status: AC
  Administered 2019-10-24: 1000 mg via ORAL
  Filled 2019-10-24: qty 2

## 2019-10-24 NOTE — Discharge Instructions (Addendum)
You have been diagnosed today with left flank pain, Shingles, complex cyst of the right ovary, cervical mass possibly nabothian cyst.  At this time there does not appear to be the presence of an emergent medical condition, however there is always the potential for conditions to change. Please read and follow the below instructions.  Please return to the Emergency Department immediately for any new or worsening symptoms. Please be sure to follow up with your Primary Care Provider within one week regarding your visit today; please call their office to schedule an appointment even if you are feeling better for a follow-up visit. You may use the muscle relaxer Robaxin as prescribed to help with your symptoms.  Do not drive or operate heavy machinery while taking Robaxin as it will make you drowsy.  Do not drink alcohol or take other sedating medications while taking Robaxin as this will worsen side effects. Please call your OB/GYN's office today to schedule a follow-up appointment for follow-up pelvic examination and ultrasound evaluation of the cyst on your right ovary and hypoechoic mass of the posterior cervix. You may continue using the Lidoderm patches as prescribed to you today to help with your symptoms.  You may take Tylenol as directed on packaging as well to help with your symptoms. At the bottom of this page I have copied and pasted the impressions of your CT scan and your ultrasound, please discuss them in their entirety with your OB/GYN and primary care provider this week. Please take valacyclovir as prescribed for treatment of your shingles, please call your primary care doctor today to schedule a follow-up appointment.  Get help right away if: You develop new bowel or bladder control problems. You have unusual weakness or numbness in your arms or legs. The rash is on your face or nose. You have pain in your face or pain by your eye. You lose feeling on one side of your face. You have  trouble seeing. You have ear pain, or you have ringing in your ear. You have a loss of taste. Your condition gets worse. You develop nausea or vomiting. You develop abdominal pain. You feel faint. You develop a rash You cannot eat or drink without throwing up (vomiting). You suddenly get a fever. Your period is a lot heavier than usual. You have any new/concerning or worsening of symptoms  Please read the additional information packets attached to your discharge summary.  Do not take your medicine if  develop an itchy rash, swelling in your mouth or lips, or difficulty breathing; call 911 and seek immediate emergency medical attention if this occurs.  CT Renal Stone Study:  IMPRESSION:  1. RIGHT adnexal mass, presumably an ovarian cyst, measuring  approximately 5 cm greatest dimension. Recommend pelvic ultrasound  to ensure benignity. No free fluid or obvious inflammation in the  adjacent RIGHT adnexa.  2. Remainder of the abdomen and pelvis CT is unremarkable, as  detailed above. No bowel obstruction or evidence of bowel wall  inflammation. No evidence of acute solid organ abnormality. No  evidence of appendicitis. No renal or ureteral calculi.  - Pelvic Ultrasound:  IMPRESSION:  1. There is a 3.1 x 2.4 x 2.4 cm complicated cyst in the right ovary  containing 1 or 2 thin septations but no visualized solid  components. Recommend a follow-up ultrasound in 6-12 weeks to ensure  resolution.  2. There is a 1.3 cm hypoechoic mass in the posterior cervix. I  suspect this is a complicated nabothian cyst. Other  etiologies are  considered less likely. Recommend clinical correlation and attention  on follow-up.  3. The left ovary was not visualized.    Note: Portions of this text may have been transcribed using voice recognition software. Every effort was made to ensure accuracy; however, inadvertent computerized transcription errors may still be present.

## 2019-10-24 NOTE — ED Provider Notes (Addendum)
[facility] DEPT Provider Note   CSN: [id number] Arrival date & time: 10/24/19  0900     History   Chief Complaint Chief Complaint  Patient presents with  . Flank Pain    HPI Vila Dory is a 48 y.o. female history asthma, GERD, lupus, mitral regurg, rosacea, asthma, anxiety, obesity.  Left flank pain onset 1:30 AM this morning, woke patient from sleep.  Sharp throbbing sensation constant no clear aggravating factors, nonradiating.  Patient reports that she took 2 Percocet shortly after pain began which resolved her symptoms however pain returned around 6 AM this morning and has been constant since that time.  She denies similar pain in the past.  Denies fever/chills, headache, chest pain/shortness of breath, cough, abdominal pain, nausea/vomiting, diarrhea, constipation, dysuria/hematuria, vaginal bleeding/discharge, concern for STI, saddle or paresthesias, bowel/bladder incontinence, urinary retention, fall/injury, numbness/weakness, tingling or any additional concerns.    HPI  Past Medical History:  Diagnosis Date  . Allergy   . Anemia    in the past  . Anxiety   . Arthritis    neck  . Asthma   . Dyspnea   . GERD (gastroesophageal reflux disease)   . Heart murmur   . Lupus ([place])   . Mitral regurgitation   . Palpitations   . Rosacea     Patient Active Problem List   Diagnosis Date Noted  . Obesity (BMI 30-39.9) 03/29/2019  . Encounter for chronic pain management 10/05/2018  . Cervicalgia 06/29/2018  . Flu-like symptoms 12/05/2015  . Sore throat 04/02/2015  . Screening for malignant neoplasm of cervix 09/20/2013  . Physical exam 09/20/2013  . Abdominal pain, epigastric 09/08/2013  . S/P cervical discectomy 06/25/2013  . Trapezius muscle spasm 12/11/2012  . Shoulder pain 12/11/2012  . Skin tag 11/19/2012  . Keratosis 11/19/2012  . 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 Cough 03/11/2011  . Anxiety state 08/14/2010  . ELEVATED BP W/O  HYPERTENSION 08/04/2009  . ROSACEA 07/25/2009  . MITRAL REGURGITATION 02/18/2009  . GERD 02/18/2009  . LUPUS 02/18/2009  . PALPITATIONS 02/18/2009    Past Surgical History:  Procedure Laterality Date  . APPENDECTOMY    . CERVICAL SPINE SURGERY  01/2010  . CHOLECYSTECTOMY  05/2010  . ROTATOR CUFF REPAIR     left  . SIGMOIDOSCOPY       OB History   No obstetric history on file.      Home Medications    Prior to Admission medications   Medication Sig Start Date End Date Taking? Authorizing Provider  albuterol (PROVENTIL HFA) 108 (90 Base) MCG/ACT inhaler Inhale 2 puffs into the lungs every 4 (four) hours as needed for wheezing. 11/19/18  Yes Midge Minium, MD  ALPRAZolam (XANAX) 0.25 MG tablet TAKE 1 TABLET(0.25 MG) BY MOUTH TWICE DAILY AS NEEDED Patient taking differently: Take 0.25 mg by mouth 2 (two) times daily as needed for anxiety.  10/20/19  Yes Brunetta Jeans, PA-C  cyclobenzaprine (FLEXERIL) 10 MG tablet Take 1 tablet (10 mg total) by mouth 3 (three) times daily as needed for muscle spasms. 07/22/19  Yes Midge Minium, MD  EPINEPHrine 0.3 mg/0.3 mL IJ SOAJ injection Inject 0.3 mLs (0.3 mg total) into the muscle once. 04/21/15  Yes Midge Minium, MD  oxyCODONE-acetaminophen (PERCOCET/ROXICET) 5-325 MG tablet Take 1 tablet by mouth every 4 (four) hours as needed. Patient taking differently: Take 1-2 tablets by mouth every 4 (four) hours as needed for moderate pain.  10/19/19  Yes Tabori,  Helane Rima, MD  vitamin C (ASCORBIC ACID) 500 MG tablet Take 500 mg by mouth 2 (two) times daily.   Yes [provider]  zinc gluconate 50 MG tablet Take 50 mg by mouth daily.   Yes [provider]  lidocaine (LIDODERM) 5 % Place 1 patch onto the skin daily. Remove & Discard patch within 12 hours or as directed by MD 10/24/19   Bill Salinas, PA-C  methocarbamol (ROBAXIN) 500 MG tablet Take 1 tablet (500 mg total) by mouth 3 (three) times daily for 7  days. 10/24/19 10/31/19  Bill Salinas, PA-C    Family History Family History  Problem Relation Age of Onset  . Coronary artery disease Mother   . Hypertension Mother   . Stroke Mother   . Rheum arthritis Mother   . Coronary artery disease Father   . Allergies Sister   . Asthma Sister   . Rheum arthritis Maternal Grandmother   . Colon cancer Neg Hx   . Esophageal cancer Neg Hx   . Rectal cancer Neg Hx   . Stomach cancer Neg Hx     Social History Social History   Tobacco Use  . Smoking status: Never Smoker  . Smokeless tobacco: Never Used  Substance Use Topics  . Alcohol use: No  . Drug use: No     Allergies   Flovent [fluticasone propionate], Codeine, Mushroom extract complex, and Other   Review of Systems Review of Systems Ten systems are reviewed and are negative for acute change except as noted in the HPI   Physical Exam Updated Vital Signs BP (!) 156/84   Pulse 69   Temp 98.5 F (36.9 C) (Oral)   Resp 16   Ht  (1.651 m)   Wt 82.1 kg   LMP 10/04/2019 (Approximate)   SpO2 100%   BMI 30.12 kg/m   Physical Exam Constitutional:      General: She is not in acute distress.    Appearance: Normal appearance. She is well-developed. She is not ill-appearing or diaphoretic.  HENT:     Head: Normocephalic and atraumatic.     Right Ear: External ear normal.     Left Ear: External ear normal.     Nose: Nose normal.  Eyes:     General: Vision grossly intact. Gaze aligned appropriately.     Pupils: Pupils are equal, round, and reactive to light.  Neck:     Musculoskeletal: Normal range of motion.     Trachea: Trachea and phonation normal. No tracheal deviation.  Cardiovascular:     Rate and Rhythm: Normal rate and regular rhythm.     Pulses:          Dorsalis pedis pulses are 2+ on the right side and 2+ on the left side.  Pulmonary:     Effort: Pulmonary effort is normal. No accessory muscle usage or respiratory distress.     Breath sounds:  Normal air entry.  Abdominal:     General: There is no distension.     Palpations: Abdomen is soft.     Tenderness: There is no abdominal tenderness. There is left CVA tenderness. There is no right CVA tenderness, guarding or rebound.  Genitourinary:    Comments: GU exam deferred by patient. Musculoskeletal: Normal range of motion.       Back:     Comments: No midline C/T/L spinal tenderness to palpation, no deformity, crepitus, or step-off noted. No sign of injury to the neck or  back. - Consistently reproducible tenderness to light palpation of the left lumbar paraspinal musculature.  Feet:     Right foot:     Protective Sensation: 3 sites tested. 3 sites sensed.     Left foot:     Protective Sensation: 3 sites tested. 3 sites sensed.  Skin:    General: Skin is warm and dry.     Findings: No rash.  Neurological:     Mental Status: She is alert.     GCS: GCS eye subscore is 4. GCS verbal subscore is 5. GCS motor subscore is 6.     Comments: Speech is clear and goal oriented, follows commands Major Cranial nerves without deficit, no facial droop Moves extremities without ataxia, coordination intact  Psychiatric:        Behavior: Behavior normal.      ED Treatments / Results  Labs (all labs ordered are listed, but only abnormal results are displayed) Labs Reviewed  URINALYSIS, ROUTINE W REFLEX MICROSCOPIC - Abnormal; Notable for the following components:      Result Value   APPearance HAZY (*)    All other components within normal limits  COMPREHENSIVE METABOLIC PANEL - Abnormal; Notable for the following components:   Calcium 8.3 (*)    All other components within normal limits  CBC WITH DIFFERENTIAL/PLATELET  LIPASE, BLOOD  POC URINE PREG, ED    EKG None  Radiology Ct Renal Stone Study  Result Date: 10/24/2019 CLINICAL DATA:  Flank pain since early this morning. EXAM: CT ABDOMEN AND PELVIS WITHOUT CONTRAST TECHNIQUE: Multidetector CT imaging of the abdomen and  pelvis was performed following the standard protocol without IV contrast. COMPARISON:  None. FINDINGS: Lower chest: No acute abnormality. Hepatobiliary: No focal liver abnormality is seen. Status post cholecystectomy. No biliary dilatation. Pancreas: Unremarkable. No pancreatic ductal dilatation or surrounding inflammatory changes. Spleen: Normal in size without focal abnormality. Adrenals/Urinary Tract: Adrenal glands appear normal. Kidneys are unremarkable without mass, stone or hydronephrosis. No ureteral or bladder calculi identified. Bladder appears normal. Stomach/Bowel: No dilated large or small bowel loops. No evidence of bowel wall inflammation. Appendix is not convincingly seen but there are no inflammatory changes about the cecum to suggest acute appendicitis. Stomach is unremarkable, partially decompressed. Vascular/Lymphatic: No significant vascular findings are present. No enlarged abdominal or pelvic lymph nodes. Reproductive: Hypodense masslike structure in the RIGHT adnexal region, presumably an ovarian cyst, measuring approximately 5 cm greatest dimension. No free fluid or obvious inflammation in the adjacent RIGHT adnexa. No mass or free fluid in the LEFT adnexal region. Other: No free fluid or abscess collection. No free intraperitoneal air. Musculoskeletal: No acute or suspicious osseous finding. IMPRESSION: 1. RIGHT adnexal mass, presumably an ovarian cyst, measuring approximately 5 cm greatest dimension. Recommend pelvic ultrasound to ensure benignity. No free fluid or obvious inflammation in the adjacent RIGHT adnexa. 2. Remainder of the abdomen and pelvis CT is unremarkable, as detailed above. No bowel obstruction or evidence of bowel wall inflammation. No evidence of acute solid organ abnormality. No evidence of appendicitis. No renal or ureteral calculi. Electronically Signed   By: Bary Richard M.D.   On: 10/24/2019 10:37   US Pelvic Complete With Transvaginal  Result Date: 10/24/2019  CLINICAL DATA:  Flank pain.  Abnormal right ovary seen on CT scan. EXAM: TRANSABDOMINAL AND TRANSVAGINAL ULTRASOUND OF PELVIS TECHNIQUE: Both transabdominal and transvaginal ultrasound examinations of the pelvis were performed. Transabdominal technique was performed for global imaging of the pelvis including uterus, ovaries, adnexal regions, and  pelvic cul-de-sac. It was necessary to proceed with endovaginal exam following the transabdominal exam to visualize the endometrium and ovaries. COMPARISON:  CT scan October 24, 2019 FINDINGS: Uterus Measurements: 9.7 x 5.0 x 6.9 cm = volume: 175.2 mL. There is a hypoechoic mass in the cervix measuring 1.3 x 1.1 x 1.2 cm. Multiple small nabothian cysts are identified. Endometrium Thickness: 6.5 mm.  No focal abnormality visualized. Right ovary Measurements: 3.8 x 2.5 x 3.6 cm = volume: 17.7 mL. Contains a septated cyst measuring 3.1 x 2.4 x 2.4 cm. No solid components identified. Left ovary Not visualized. Other findings No abnormal free fluid. IMPRESSION: 1. There is a 3.1 x 2.4 x 2.4 cm complicated cyst in the right ovary containing 1 or 2 thin septations but no visualized solid components. Recommend a follow-up ultrasound in 6-12 weeks to ensure resolution. 2. There is a 1.3 cm hypoechoic mass in the posterior cervix. I suspect this is a complicated nabothian cyst. Other etiologies are considered less likely. Recommend clinical correlation and attention on follow-up. 3. The left ovary was not visualized. Electronically Signed   By: Gerome Samavid  Williams III M.D   On: 10/24/2019 12:35    Procedures Procedures (including critical care time)  Medications Ordered in ED Medications  methocarbamol (ROBAXIN) tablet 500 mg (has no administration in time range)  lidocaine (LIDODERM) 5 % 1 patch (has no administration in time range)  morphine 4 MG/ML injection 4 mg (4 mg Intravenous Given 10/24/19 0957)  ondansetron (ZOFRAN) injection 4 mg (4 mg Intravenous Given 10/24/19  0957)  HYDROmorphone (DILAUDID) injection 0.5 mg (0.5 mg Intravenous Given 10/24/19 1052)     Initial Impression / Assessment and Plan / ED Course  I have reviewed the triage vital signs and the nursing notes.  Pertinent labs & imaging results that were available during my care of the patient were reviewed by me and considered in my medical decision making (see chart for details).    48 year old female arrives with left flank pain beginning early this morning, woke her from sleep.  Resolved after taking 2 Percocet but returned several hours later.  On exam she is well-appearing and in no acute distress, cranial nerves intact, no neuro deficits on exam, no meningeal signs, neurovascularly intact to all 4 extremities without sign of DVT, abdomen is soft nontender without peritoneal signs, GU exam deferred by patient, pain is consistently reproducible with palpation of the left paraspinal lumbar musculature, there is no sign of injury and no rash.  Possibility this is a lumbar muscular strain however there is also concern for possible kidney stone, UA is pending, will obtain CT renal stone study and basic blood work. - Urine pregnancy negative Urinalysis with hazy appearance, otherwise within normal limits CBC within normal limits Lipase within normal limits CMP calcium 8.3 otherwise within normal limits CT renal stone study: IMPRESSION:  1. RIGHT adnexal mass, presumably an ovarian cyst, measuring  approximately 5 cm greatest dimension. Recommend pelvic ultrasound  to ensure benignity. No free fluid or obvious inflammation in the  adjacent RIGHT adnexa.  2. Remainder of the abdomen and pelvis CT is unremarkable, as  detailed above. No bowel obstruction or evidence of bowel wall  inflammation. No evidence of acute solid organ abnormality. No  evidence of appendicitis. No renal or ureteral calculi.  - Patient reassessed resting comfortably no acute distress with her husband at bedside.   Discussed results as above and patient states understanding.  She is agreeable to pelvic ultrasound with transvaginal  at this time, deferred GU examination.  Suspect adnexal mass as incidental finding, reassuring CT scan otherwise suspect patient with musculoskeletal back pain without sciatica.  No abdominal pain, chest pain or shortness of breath, no evidence for acute cardiopulmonary or intra-abdominal etiology of her symptoms today.  She has no red flag symptoms to suggest cauda equina, spinal epidural abscess, AAA or other acute pathologies requiring further imaging in the emergency department.  Patient is agreeable to discharge after pelvic ultrasound with muscle relaxers and Lidoderm patch.  She has history of GERD and peptic ulcer disease will need to avoid NSAIDs. - IMPRESSION:  1. There is a 3.1 x 2.4 x 2.4 cm complicated cyst in the right ovary  containing 1 or 2 thin septations but no visualized solid  components. Recommend a follow-up ultrasound in 6-12 weeks to ensure  resolution.  2. There is a 1.3 cm hypoechoic mass in the posterior cervix. I  suspect this is a complicated nabothian cyst. Other etiologies are  considered less likely. Recommend clinical correlation and attention  on follow-up.  3. The left ovary was not visualized.  - Patient reassessed resting comfortably no acute distress states understanding of imaging above and has no further questions.  She is requesting referral to local OB/GYN, she does have an OB/GYN but is concerned that she may not be for follow-up with them as it has been 2-3 years since she has seen them.  I have given her referral to women's health today.  Patient is aware that she needs to call to schedule a follow-up appointment for reevaluation and that repeat ultrasound is recommended in 6-12 weeks.  I recommended that the patient schedule a follow-up appointment for this week for recheck and to have the ultrasound scheduled through the OB/GYN's office at  that time.  Pelvic exam was deferred today feel this is reasonable at this time as these appear to be incidental findings, low suspicion for infection or other acute pathologies no indication for antibiotics at this time or further work-up here in the emergency department. --- At discharge a faint rash was noticed on the left side of the patient's abdomen by RN, this was not present upon initial evaluation approximately 5 hours ago.  Patient has not noticed this rash before.  There is a faint erythematous papules arising in the same distribution as the patient's pain.  T8 or T9 dermatome.  Believe this is consistent today with early herpes zoster we will treat patient with valacyclovir 1000 mg 3 times daily for 7 days and have her follow-up with her primary care provider this week.  Discussed precautions with herpes zoster with patient and her husband and they state understanding.  No evidence for ocular, neurologic or other emergent complications at this time.  At this time there does not appear to be any evidence of an acute emergency medical condition and the patient appears stable for discharge with appropriate outpatient follow up. Diagnosis was discussed with patient who verbalizes understanding of care plan and is agreeable to discharge. I have discussed return precautions with patient and husband who verbalizes understanding of return precautions. Patient encouraged to follow-up with their PCP OB/GYN. All questions answered.  Discussed case with Dr. Freida Busman who agrees with discharge with valacyclovir and outpatient PCP and OB/GYN follow-up.  Note: Portions of this report may have been transcribed using voice recognition software. Every effort was made to ensure accuracy; however, inadvertent computerized transcription errors may still be present. Final Clinical Impressions(s) / ED Diagnoses  Final diagnoses:  Flank pain  Complex cyst of right ovary  Cervical mass  Herpes zoster without  complication    ED Discharge Orders         Ordered    methocarbamol (ROBAXIN) 500 MG tablet  3 times daily     10/24/19 1405    lidocaine (LIDODERM) 5 %  Every 24 hours     10/24/19 1405    valACYclovir (VALTREX) 1000 MG tablet  3 times daily     10/24/19 1452              Elizabeth Palau 10/24/19 1459    Lorre Nick, MD 10/26/19 1053

## 2019-10-24 NOTE — ED Triage Notes (Signed)
Patient c/o left flank pain since 0130 today. Patient denies any hematuria.

## 2019-10-25 ENCOUNTER — Encounter: Payer: Self-pay | Admitting: Family Medicine

## 2019-10-25 MED ORDER — GABAPENTIN 100 MG PO CAPS: 100.0000 mg | ORAL_CAPSULE | Freq: Three times a day (TID) | ORAL | 3 refills | Status: DC

## 2019-10-25 MED ORDER — ONDANSETRON HCL 4 MG PO TABS: 4.0000 mg | ORAL_TABLET | Freq: Three times a day (TID) | ORAL | 0 refills | Status: DC | PRN

## 2019-10-26 NOTE — Telephone Encounter (Signed)
Pt called and stated that the medications aren't strong enough for the pain she is in with the shingles

## 2019-10-27 ENCOUNTER — Other Ambulatory Visit: Payer: Self-pay | Admitting: Family Medicine

## 2019-10-27 ENCOUNTER — Telehealth: Payer: Self-pay | Admitting: General Practice

## 2019-10-27 MED ORDER — LIDOCAINE 5 % EX PTCH: 1.0000 | MEDICATED_PATCH | CUTANEOUS | 0 refills | Status: DC

## 2019-10-27 NOTE — Telephone Encounter (Signed)
PA began in covermymeds for lidocaine patches today.

## 2019-10-27 NOTE — Progress Notes (Signed)
Pt never got script from ER

## 2019-10-29 ENCOUNTER — Ambulatory Visit (INDEPENDENT_AMBULATORY_CARE_PROVIDER_SITE_OTHER): Payer: BC Managed Care – PPO | Admitting: Family Medicine

## 2019-10-29 ENCOUNTER — Encounter: Payer: Self-pay | Admitting: Family Medicine

## 2019-10-29 ENCOUNTER — Other Ambulatory Visit: Payer: Self-pay

## 2019-10-29 DIAGNOSIS — B029 Zoster without complications: Secondary | ICD-10-CM

## 2019-10-29 MED ORDER — GABAPENTIN 300 MG PO CAPS: 300.0000 mg | ORAL_CAPSULE | Freq: Three times a day (TID) | ORAL | 3 refills | Status: DC

## 2019-10-29 NOTE — Progress Notes (Signed)
I have discussed the procedure for the virtual visit with the patient who has given consent to proceed with assessment and treatment.   Pt unable to obtain vitals.   Jessica L Brodmerkel, CMA     

## 2019-10-29 NOTE — Progress Notes (Signed)
Virtual Visit via Video   I connected with patient on 10/29/19 at  8:30 AM EST by a video enabled telemedicine application and verified that I am speaking with the correct person using two identifiers.  Location patient: Home Location provider: [facility], Office Persons participating in the virtual visit: Patient, Provider, CMA (Jess B)  I discussed the limitations of evaluation and management by telemedicine and the availability of in person appointments. The patient expressed understanding and agreed to proceed.  Subjective:   HPI:   Shingles- pt went to ER on 11/29 and was dx'd w/ shingles.  'this is pain unlike anything i've ever experienced before'.  Shingles now extend from belly button to spine on L side.  Pt doesn't note any improvement w/ Lidocaine patches.  Taking Valtrex 3x/day.  Taking Percocet 'far more frequently than ever'.  Also taking Gabapentin.  Also taking Ibuprofen.  Cool compresses feel better.  ROS:   See pertinent positives and negatives per HPI.  Patient Active Problem List   Diagnosis Date Noted  . Obesity (BMI 30-39.9) 03/29/2019  . Encounter for chronic pain management 10/05/2018  . Cervicalgia 06/29/2018  . Flu-like symptoms 12/05/2015  . Sore throat 04/02/2015  . Screening for malignant neoplasm of cervix 09/20/2013  . Physical exam 09/20/2013  . Abdominal pain, epigastric 09/08/2013  . S/P cervical discectomy 06/25/2013  . Trapezius muscle spasm 12/11/2012  . Shoulder pain 12/11/2012  . Skin tag 11/19/2012  . Keratosis 11/19/2012  . 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 Cough 03/11/2011  . Anxiety state 08/14/2010  . ELEVATED BP W/O HYPERTENSION 08/04/2009  . ROSACEA 07/25/2009  . MITRAL REGURGITATION 02/18/2009  . GERD 02/18/2009  . LUPUS 02/18/2009  . PALPITATIONS 02/18/2009    Social History   Tobacco Use  . Smoking status: Never Smoker  . Smokeless tobacco: Never Used  Substance Use Topics  . Alcohol use: No    Current  Outpatient Medications:  .  albuterol (PROVENTIL HFA) 108 (90 Base) MCG/ACT inhaler, Inhale 2 puffs into the lungs every 4 (four) hours as needed for wheezing., Disp: 1 Inhaler, Rfl: 12 .  ALPRAZolam (XANAX) 0.25 MG tablet, TAKE 1 TABLET(0.25 MG) BY MOUTH TWICE DAILY AS NEEDED (Patient taking differently: Take 0.25 mg by mouth 2 (two) times daily as needed for anxiety. ), Disp: 30 tablet, Rfl: 0 .  cyclobenzaprine (FLEXERIL) 10 MG tablet, Take 1 tablet (10 mg total) by mouth 3 (three) times daily as needed for muscle spasms., Disp: 30 tablet, Rfl: 3 .  EPINEPHrine 0.3 mg/0.3 mL IJ SOAJ injection, Inject 0.3 mLs (0.3 mg total) into the muscle once., Disp: 1 Device, Rfl: 2 .  gabapentin (NEURONTIN) 100 MG capsule, Take 1 capsule (100 mg total) by mouth 3 (three) times daily., Disp: 90 capsule, Rfl: 3 .  methocarbamol (ROBAXIN) 500 MG tablet, Take 1 tablet (500 mg total) by mouth 3 (three) times daily for 7 days., Disp: 21 tablet, Rfl: 0 .  ondansetron (ZOFRAN) 4 MG tablet, Take 1 tablet (4 mg total) by mouth every 8 (eight) hours as needed for nausea or vomiting., Disp: 20 tablet, Rfl: 0 .  oxyCODONE-acetaminophen (PERCOCET/ROXICET) 5-325 MG tablet, Take 1 tablet by mouth every 4 (four) hours as needed. (Patient taking differently: Take 1-2 tablets by mouth every 4 (four) hours as needed for moderate pain. ), Disp: 30 tablet, Rfl: 0 .  valACYclovir (VALTREX) 1000 MG tablet, Take 1 tablet (1,000 mg total) by mouth 3 (three) times daily for 7 days., Disp: 21  tablet, Rfl: 0 .  vitamin C (ASCORBIC ACID) 500 MG tablet, Take 500 mg by mouth 2 (two) times daily., Disp: , Rfl:  .  zinc gluconate 50 MG tablet, Take 50 mg by mouth daily., Disp: , Rfl:  .  lidocaine (LIDODERM) 5 %, Place 1 patch onto the skin daily. Remove & Discard patch within 12 hours or as directed by MD (Patient not taking: Reported on 10/29/2019), Disp: 30 patch, Rfl: 0  Allergies  Allergen Reactions  . Flovent [Fluticasone Propionate]      Difficulty breathing. No issues with Qvar Rxed by Dr Gwenette Greet  . Codeine     REACTION: vomiting  . Mushroom Extract Complex Swelling    Pt reports hand swelling when consuming mushrooms.   . Other     Bee stings and pecan nuts     Objective:   LMP 10/04/2019 (Approximate)  AAOx3, NAD NCAT, EOMI No obvious CN deficits Coloring WNL Pt unable to show rash Pt is able to speak clearly, coherently without shortness of breath or increased work of breathing.  Thought process is linear.  Mood is appropriate.   Assessment and Plan:   Shingles- new.  dx'd 11/29 in ER.  Her rash has progressed from 3 blisters on 11/29 to a band extending from umbilicus to spine.  Is on Valtrex, Gabapentin, Lidocaine, and Percocet.  Pain remains severe.  Increased Gabapentin to 300mg  TID.  Encouraged ice packs and cool wash clothes.  Reviewed supportive care and red flags that should prompt return.  Pt expressed understanding and is in agreement w/ plan.    Annye Asa, MD 10/29/2019

## 2019-11-01 ENCOUNTER — Other Ambulatory Visit: Payer: Self-pay | Admitting: Family Medicine

## 2019-11-01 NOTE — Telephone Encounter (Signed)
Last OV 10/29/19 Oxycodone last filled 10/19/19 #30 with 0

## 2019-11-02 MED ORDER — OXYCODONE-ACETAMINOPHEN 5-325 MG PO TABS: 1.0000 | ORAL_TABLET | ORAL | 0 refills | Status: DC | PRN

## 2019-11-12 ENCOUNTER — Encounter: Payer: Self-pay | Admitting: Family Medicine

## 2019-11-12 MED ORDER — VALACYCLOVIR HCL 1 G PO TABS: 1000.0000 mg | ORAL_TABLET | Freq: Three times a day (TID) | ORAL | 0 refills | Status: DC

## 2019-11-15 ENCOUNTER — Other Ambulatory Visit: Payer: Self-pay | Admitting: Physician Assistant

## 2019-11-15 ENCOUNTER — Other Ambulatory Visit: Payer: Self-pay | Admitting: Family Medicine

## 2019-11-16 MED ORDER — OXYCODONE-ACETAMINOPHEN 5-325 MG PO TABS: 1.0000 | ORAL_TABLET | ORAL | 0 refills | Status: DC | PRN

## 2019-11-16 MED ORDER — ALPRAZOLAM 0.25 MG PO TABS: ORAL_TABLET | ORAL | 3 refills | Status: DC

## 2019-11-16 NOTE — Telephone Encounter (Signed)
Last OV 10/29/19 Oxycodone last filled 11/02/19 #30 with 0

## 2019-11-16 NOTE — Telephone Encounter (Signed)
Last OV 10/29/19 Alprazolam last filled 10/20/19 #30 with 0

## 2019-11-24 ENCOUNTER — Other Ambulatory Visit: Payer: Self-pay

## 2019-11-24 NOTE — Progress Notes (Signed)
Entered in error

## 2019-11-29 ENCOUNTER — Encounter: Payer: Self-pay | Admitting: Family Medicine

## 2019-12-06 ENCOUNTER — Other Ambulatory Visit: Payer: Self-pay | Admitting: General Practice

## 2019-12-06 ENCOUNTER — Encounter: Payer: Self-pay | Admitting: Family Medicine

## 2019-12-06 MED ORDER — CYCLOBENZAPRINE HCL 10 MG PO TABS: 10.0000 mg | ORAL_TABLET | Freq: Three times a day (TID) | ORAL | 3 refills | Status: DC | PRN

## 2019-12-15 ENCOUNTER — Other Ambulatory Visit: Payer: Self-pay | Admitting: Family Medicine

## 2019-12-16 MED ORDER — OXYCODONE-ACETAMINOPHEN 5-325 MG PO TABS: 1.0000 | ORAL_TABLET | ORAL | 0 refills | Status: DC | PRN

## 2019-12-16 NOTE — Telephone Encounter (Signed)
Last refill: 12.22.20 #30, 0 Last OV: 12.4.20 dx. Follow up

## 2019-12-27 ENCOUNTER — Encounter: Payer: Self-pay | Admitting: Family Medicine

## 2020-01-11 ENCOUNTER — Encounter: Payer: Self-pay | Admitting: Family Medicine

## 2020-01-12 ENCOUNTER — Other Ambulatory Visit: Payer: Self-pay | Admitting: Family Medicine

## 2020-01-13 NOTE — Telephone Encounter (Signed)
Last OV 10/29/19 Oxycodone last filled 12/16/19 #30 with 0 

## 2020-01-14 ENCOUNTER — Encounter: Payer: Self-pay | Admitting: Family Medicine

## 2020-01-14 ENCOUNTER — Other Ambulatory Visit: Payer: Self-pay | Admitting: Family Medicine

## 2020-01-14 MED ORDER — OXYCODONE-ACETAMINOPHEN 5-325 MG PO TABS: 1.0000 | ORAL_TABLET | ORAL | 0 refills | Status: DC | PRN

## 2020-01-14 NOTE — Telephone Encounter (Signed)
Last OV 10/29/19 Oxycodone last filled 12/16/19 #30 with 0

## 2020-01-31 ENCOUNTER — Telehealth: Payer: Self-pay

## 2020-01-31 DIAGNOSIS — N83209 Unspecified ovarian cyst, unspecified side: Secondary | ICD-10-CM

## 2020-01-31 NOTE — Telephone Encounter (Signed)
Referral was placed today

## 2020-01-31 NOTE — Telephone Encounter (Signed)
Patient has no GYN and would like for your recommendations on this.

## 2020-01-31 NOTE — Telephone Encounter (Signed)
Patient states she was recently diagnosed with ovarian cyst. She states she has been having intermittent sharp pains in her right lower abdomen. She states it comes and goes but is more frequent now and gives her the urge to have a bowel movement. Patient is aware that PCP schedule is full and if needed, she would not mind seeing a provider at the [place] location. Please advise.

## 2020-01-31 NOTE — Telephone Encounter (Signed)
Please advise 

## 2020-01-31 NOTE — Telephone Encounter (Signed)
Upon reviewing her chart, this was found on imaging done at the ER and f/u US was recommended.  Since she is still having pain, I suggest a GYN referral.  If pt has GYN, she should call them.  If not, we can place referral.

## 2020-01-31 NOTE — Telephone Encounter (Signed)
Please refer to GYN- Dr Seymour Bars if possible.  Dx ovarian cyst

## 2020-02-01 ENCOUNTER — Other Ambulatory Visit: Payer: Self-pay

## 2020-02-03 ENCOUNTER — Encounter: Payer: Self-pay | Admitting: Obstetrics and Gynecology

## 2020-02-03 ENCOUNTER — Ambulatory Visit (INDEPENDENT_AMBULATORY_CARE_PROVIDER_SITE_OTHER): Payer: BC Managed Care – PPO

## 2020-02-03 ENCOUNTER — Ambulatory Visit: Payer: BC Managed Care – PPO | Admitting: Obstetrics and Gynecology

## 2020-02-03 ENCOUNTER — Other Ambulatory Visit: Payer: Self-pay

## 2020-02-03 VITALS — BP 124/80 | Ht 64.0 in | Wt 185.0 lb

## 2020-02-03 DIAGNOSIS — Z8742 Personal history of other diseases of the female genital tract: Secondary | ICD-10-CM | POA: Diagnosis not present

## 2020-02-03 DIAGNOSIS — R1031 Right lower quadrant pain: Secondary | ICD-10-CM

## 2020-02-03 DIAGNOSIS — N83291 Other ovarian cyst, right side: Secondary | ICD-10-CM

## 2020-02-03 NOTE — Progress Notes (Signed)
Lynn Campos  09/04/1971 [id number]  HPI The patient is a 49 y.o. G2P2 who is referred by her primary doctor, Dr. Birdie Riddle, for evaluation of an intermittent RLQ pain.  She was seen in the ED for the left flank pain back in November 2020, and part of the work-up included a CT of the abdomen and pelvis which did indicate a right adnexal mass, but she was found to have shingles on her left flank, which is since gotten better.  Pelvic ultrasound was performed same day in the ED and indicated the right ovary had a complex septated cyst measuring 3.1 x 2.4 x 2.4 cm without solid components.  There were several nabothian cysts in the cervix.  The remainder of the ultrasound was unremarkable.  The ovary was not visualized.  She indicates her periods are still regular and monthly.  No excessively heavy periods or significant pain on the period.  She has a history of PCOS with infrequent periods, and worked with a fertility specialist to conceive with Clomid.  She had not been having any right-sided pain here until the past few weeks, especially this last weekend, she has noted an intermittent sharp jolt of right lower quadrant pain that does not last for more than several seconds to minutes.  It happens up to a few times per day.  She does take Percocet for a chronic neck pain issue, and has occasionally needed to take that to help with this pain, but heat over the abdomen also helps.  When she gets the pain she feels an urge to defecate, but defecation then brings on more intense pain.  She is not having any significant change in bowel movements or appetite, no nausea or vomiting.   Past medical history,surgical history, problem list, medications, allergies, family history and social history were all reviewed and documented as reviewed in the EPIC chart.  ROS:  Feeling well. No dyspnea or chest pain on exertion.  No abdominal pain, change in bowel habits, black or bloody stools.  No urinary tract symptoms. GYN  ROS: normal menses, no abnormal bleeding, pelvic pain or discharge.  Physical Exam  BP 124/80   Ht 5\' 4"  (1.626 m)   Wt 185 lb (83.9 kg)   LMP 01/20/2020   BMI 31.76 kg/m   General: Pleasant female, no acute distress, alert and oriented Abdomen: Soft, nondistended, nontender to palpation.  No rebound or guarding. PELVIC EXAM: VULVA: normal appearing vulva with no masses, tenderness or lesions, VAGINA: normal appearing vagina with normal color and discharge, no lesions, CERVIX: normal appearing cervix without discharge or lesions, UTERUS: uterus is normal size, shape, consistency and nontender, ADNEXA: normal adnexa in size, nontender and no masses.  Bimanual palpation of the right lower abdominal wall in the vicinity of the right inguinal ligament elicits moderate discomfort and voluntary guarding.  No hernia is appreciated. Caryn Bee present for the examination Urinalysis shows 6-10 WBC, no RBC, 0-5 squamous epithelial cells, moderate calcium oxalate crystals, trace leukocyte esterase, negative nitrite, trace protein  Pelvic ultrasound Anteverted uterus 7.6 x 4.8 x 4.0 cm Normal uterus size and shape without myometrial masses 8 mm endocervical polyp with feeder vessel identified Thin and symmetrical endometrium 3.2 mm thickness Left ovary appears normal Right ovary appears normal with normal follicle pattern and perfusion No adnexal masses.  No free fluid.   Assessment 49 yo premenopausal G2 P2 with right lower quadrant pain likely related to musculoskeletal or GI cause  Plan We discussed ovarian cysts and  that over time they can either resolve, continue to grow, stay stable in size, rupture, or potentially lead to ovarian torsion.  Management options if minimal to no symptoms include surveillance.  It has been 3 months since her last ultrasound, so we did perform a pelvic ultrasound today that showed resolution of her previously identified right ovarian cyst.  Her pelvic ultrasound  appears normal, other than a tiny endocervical polyp incidentally discovered.  Her ovarian cyst has spontaneously resolved, and in its place on the right ovary is a small 1.3 cm follicle and a 0.8 cm follicle, neither of which I would expect to really be causing her any significant level of pain.  Doppler blood flow to the ovary is also normal at this time so torsion is not a concern.  The cysts are very small and I do not think intermittent torsion is likely.  There is no apparent gynecologic cause of her RLQ pain on ultrasound or exam today.   In regard to the endocervical polyp, she is not having any abnormal uterine bleeding at this time, but I did advise her that if she were to develop any irregular spotting or bleeding, or heavy periods, she should make note of this polyp as though symptoms can be associated with polyps.  No further follow-up is needed for a small polyp if it is asymptomatic.  She should continue her routine Pap smears which have been normal.  Bimanual compression of her lower abdominal wall elicits some tenderness, so she may have a musculoskeletal strain that may be contributing to her pain.  Also, the relation of her pain to defecation makes me wonder if she has something gastrointestinal doing on.  She does have some calcium oxalate crystals in the urine but no hematuria, so this makes a kidney/ureteral stone less likely, and her exam and description of symptoms does not really suggest this etiology.  In her urine she does have some white blood cells, but I would wait for culture results before deciding to treat for low-grade UTI as this is not overly convincing on the UA today.  If the pain becomes bothersome, more frequent, and/or more severe, I did recommend that she check in with her primary care doctor to see if they have any other recommendations for further work-up. Separately, if planning a meeting with a GI specialist, it would be a good time to start colonoscopy screening  since she is age 33. All questions were answered by the end of today's visit.  Theresia Majors MD 02/03/20

## 2020-02-05 LAB — URINALYSIS, COMPLETE W/RFL CULTURE
Bilirubin Urine: NEGATIVE
Glucose, UA: NEGATIVE
Hgb urine dipstick: NEGATIVE
Hyaline Cast: NONE SEEN /LPF
Ketones, ur: NEGATIVE
Nitrites, Initial: NEGATIVE
RBC / HPF: NONE SEEN /HPF (ref 0–2)
Specific Gravity, Urine: 1.02 (ref 1.001–1.03)
pH: 6.5 (ref 5.0–8.0)

## 2020-02-05 LAB — URINE CULTURE
MICRO NUMBER:: 10240615
SPECIMEN QUALITY:: ADEQUATE

## 2020-02-05 LAB — CULTURE INDICATED

## 2020-02-10 ENCOUNTER — Other Ambulatory Visit: Payer: Self-pay | Admitting: Family Medicine

## 2020-02-10 MED ORDER — OXYCODONE-ACETAMINOPHEN 5-325 MG PO TABS: 1.0000 | ORAL_TABLET | ORAL | 0 refills | Status: DC | PRN

## 2020-02-10 NOTE — Telephone Encounter (Signed)
Last OV 10/29/19  Oxycodone last filled 01/14/20 #30 with 0

## 2020-03-07 ENCOUNTER — Other Ambulatory Visit: Payer: Self-pay | Admitting: Family Medicine

## 2020-03-08 MED ORDER — OXYCODONE-ACETAMINOPHEN 5-325 MG PO TABS: 1.0000 | ORAL_TABLET | ORAL | 0 refills | Status: DC | PRN

## 2020-03-08 NOTE — Telephone Encounter (Signed)
Last OV / Oxycodone last filled / #30 with 0

## 2020-04-05 ENCOUNTER — Other Ambulatory Visit: Payer: Self-pay | Admitting: General Practice

## 2020-04-05 MED ORDER — ALPRAZOLAM 0.25 MG PO TABS: ORAL_TABLET | ORAL | 3 refills | Status: DC

## 2020-04-05 NOTE — Telephone Encounter (Signed)
Last OV 10/29/19 Alprazolam last filled 11/16/19 #30 with 3

## 2020-04-10 ENCOUNTER — Other Ambulatory Visit: Payer: Self-pay | Admitting: Family Medicine

## 2020-04-10 MED ORDER — OXYCODONE-ACETAMINOPHEN 5-325 MG PO TABS: 1.0000 | ORAL_TABLET | ORAL | 0 refills | Status: DC | PRN

## 2020-04-10 NOTE — Telephone Encounter (Signed)
Last OV 02/03/20 Oxycodone last filled 03/08/20 #30 with 0

## 2020-04-12 ENCOUNTER — Encounter: Payer: Self-pay | Admitting: Family Medicine

## 2020-04-12 ENCOUNTER — Other Ambulatory Visit: Payer: Self-pay | Admitting: General Practice

## 2020-04-12 MED ORDER — CYCLOBENZAPRINE HCL 10 MG PO TABS: 10.0000 mg | ORAL_TABLET | Freq: Three times a day (TID) | ORAL | 3 refills | Status: DC | PRN

## 2020-04-13 ENCOUNTER — Other Ambulatory Visit: Payer: Self-pay

## 2020-04-13 ENCOUNTER — Ambulatory Visit: Payer: BC Managed Care – PPO | Admitting: Family Medicine

## 2020-04-13 ENCOUNTER — Encounter: Payer: Self-pay | Admitting: Family Medicine

## 2020-04-13 VITALS — BP 131/82 | HR 86 | Temp 97.9°F | Resp 16 | Wt 192.1 lb

## 2020-04-13 DIAGNOSIS — M5412 Radiculopathy, cervical region: Secondary | ICD-10-CM

## 2020-04-13 MED ORDER — PREDNISONE 10 MG PO TABS: ORAL_TABLET | ORAL | 0 refills | Status: DC

## 2020-04-13 MED ORDER — KETOROLAC TROMETHAMINE 60 MG/2ML IM SOLN
60.0000 mg | Freq: Once | INTRAMUSCULAR | Status: AC
  Administered 2020-04-13: 60 mg via INTRAMUSCULAR

## 2020-04-13 NOTE — Patient Instructions (Signed)
Follow up as needed or as scheduled START the Prednisone taper tomorrow morning- take w/ food Continue to use heat and flexeril If not improving near the end of the Prednisone taper, call and schedule w/ Dr Ethelene Hal Call with any questions or concerns Sherri Rad in there!

## 2020-04-13 NOTE — Progress Notes (Signed)
   Subjective:    Patient ID: Lynn Campos, female    DOB: [birth year], 49 y.o.   MRN: [id number]  HPI Neck pain- pt was helping son move into a new apartment on Saturday and now neck and back are hurting.  Has known DDD at C7/T1.  Pain is radiating down both arms.  Typically just L arm is impacted.  Sxs woke pt from sleep that night.  Has tried ice, heat, flexeril.  Some relief w/ heat and flexeril.  Has Oxycodone available which provides some relief.  No weakness of UEs.     Review of Systems For ROS see HPI   This visit occurred during the SARS-CoV-2 public health emergency.  Safety protocols were in place, including screening questions prior to the visit, additional usage of staff PPE, and extensive cleaning of exam room while observing appropriate contact time as indicated for disinfecting solutions.       Objective:   Physical Exam Vitals reviewed.  Constitutional:      General: She is not in acute distress.    Appearance: Normal appearance. She is not ill-appearing.  HENT:     Head: Normocephalic and atraumatic.  Neck:     Comments: Limited forward flexion, extension, and rotation Musculoskeletal:     Cervical back: Tenderness (TTP over C7 and bilateral trap spasm) present.  Neurological:     General: No focal deficit present.     Mental Status: She is alert and oriented to person, place, and time.     Sensory: No sensory deficit.     Motor: No weakness.     Deep Tendon Reflexes: Reflexes normal.           Assessment & Plan:  Cervical radiculopathy- deteriorated.  Pt having severe pain radiating into both arms.  No red flags on hx or PE exam.  Sxs not resolving w/ Flexeril or Oxycodone.  Will start Prednisone taper tomorrow AM and Toradol given in office today.  Reviewed supportive care and red flags that should prompt return.  Pt expressed understanding and is in agreement w/ plan.

## 2020-04-15 ENCOUNTER — Encounter: Payer: Self-pay | Admitting: Family Medicine

## 2020-05-03 ENCOUNTER — Encounter: Payer: Self-pay | Admitting: Family Medicine

## 2020-05-07 ENCOUNTER — Other Ambulatory Visit: Payer: Self-pay | Admitting: Family Medicine

## 2020-05-08 MED ORDER — OXYCODONE-ACETAMINOPHEN 5-325 MG PO TABS: 1.0000 | ORAL_TABLET | ORAL | 0 refills | Status: DC | PRN

## 2020-05-08 NOTE — Telephone Encounter (Signed)
Last OV 04/13/20 Oxycodone last filled 04/10/20 #30 with 0

## 2020-05-30 ENCOUNTER — Other Ambulatory Visit: Payer: Self-pay | Admitting: Family Medicine

## 2020-05-30 NOTE — Telephone Encounter (Signed)
Oxycodone LFD 05/08/20 #30 with no refills LOV 04/13/20 NOV none

## 2020-05-31 DIAGNOSIS — M1712 Unilateral primary osteoarthritis, left knee: Secondary | ICD-10-CM | POA: Insufficient documentation

## 2020-06-05 ENCOUNTER — Other Ambulatory Visit: Payer: Self-pay | Admitting: Family Medicine

## 2020-06-06 MED ORDER — OXYCODONE-ACETAMINOPHEN 5-325 MG PO TABS: 1.0000 | ORAL_TABLET | ORAL | 0 refills | Status: DC | PRN

## 2020-06-06 NOTE — Telephone Encounter (Signed)
Last OV 04/13/20 Oxycodone last filled 05/08/20 #30 with 0

## 2020-07-05 ENCOUNTER — Other Ambulatory Visit: Payer: Self-pay | Admitting: Family Medicine

## 2020-07-05 NOTE — Telephone Encounter (Signed)
Last OV 10/29/19 Oxycodone last filled 06/06/20 #30 with 0

## 2020-07-07 NOTE — Telephone Encounter (Signed)
Patient overdue for chronic pain medication follow-up. Will have to wait for PCP return for refill.

## 2020-07-10 MED ORDER — OXYCODONE-ACETAMINOPHEN 5-325 MG PO TABS: 1.0000 | ORAL_TABLET | ORAL | 0 refills | Status: DC | PRN

## 2020-08-01 ENCOUNTER — Other Ambulatory Visit: Payer: Self-pay | Admitting: Family Medicine

## 2020-08-02 ENCOUNTER — Encounter: Payer: Self-pay | Admitting: Family Medicine

## 2020-08-02 NOTE — Telephone Encounter (Signed)
Last OV 04/13/20 Alprazolam last filled 04/05/20 #30 with 2

## 2020-08-08 ENCOUNTER — Other Ambulatory Visit: Payer: Self-pay | Admitting: Family Medicine

## 2020-08-09 MED ORDER — OXYCODONE-ACETAMINOPHEN 5-325 MG PO TABS: 1.0000 | ORAL_TABLET | ORAL | 0 refills | Status: DC | PRN

## 2020-08-09 NOTE — Telephone Encounter (Signed)
Last OV 04/13/20 Oxycodone last filled 07/10/20 #30 with 0

## 2020-09-02 ENCOUNTER — Other Ambulatory Visit: Payer: Self-pay | Admitting: Family Medicine

## 2020-09-04 MED ORDER — OXYCODONE-ACETAMINOPHEN 5-325 MG PO TABS
1.0000 | ORAL_TABLET | ORAL | 0 refills | Status: DC | PRN
Start: 2020-09-04 — End: 2020-09-28

## 2020-09-04 NOTE — Telephone Encounter (Signed)
Last OV 04/13/20 OXycodone last filled 08/09/20 #30 with 0

## 2020-09-28 ENCOUNTER — Other Ambulatory Visit: Payer: Self-pay | Admitting: Family Medicine

## 2020-09-28 NOTE — Telephone Encounter (Signed)
Percocet 5/325 mg last rx 09/04/2020 #30 LOV: 04/13/20 Cervical radiculopathy

## 2020-09-29 MED ORDER — OXYCODONE-ACETAMINOPHEN 5-325 MG PO TABS
1.0000 | ORAL_TABLET | ORAL | 0 refills | Status: DC | PRN
Start: 2020-09-29 — End: 2020-10-25

## 2020-10-25 ENCOUNTER — Other Ambulatory Visit: Payer: Self-pay | Admitting: Family Medicine

## 2020-10-25 MED ORDER — OXYCODONE-ACETAMINOPHEN 5-325 MG PO TABS
1.0000 | ORAL_TABLET | ORAL | 0 refills | Status: DC | PRN
Start: 2020-10-25 — End: 2020-11-13

## 2020-10-25 NOTE — Telephone Encounter (Signed)
LR: 09-29-2020 Qty: 30 with 0 refills  Last office visit: 04-13-2020 Upcoming appointment: No pending appt

## 2020-11-13 ENCOUNTER — Other Ambulatory Visit: Payer: Self-pay | Admitting: Family Medicine

## 2020-11-14 ENCOUNTER — Other Ambulatory Visit: Payer: Self-pay | Admitting: Family Medicine

## 2020-11-14 MED ORDER — OXYCODONE-ACETAMINOPHEN 5-325 MG PO TABS
1.0000 | ORAL_TABLET | ORAL | 0 refills | Status: DC | PRN
Start: 2020-11-14 — End: 2020-12-07

## 2020-11-14 NOTE — Telephone Encounter (Signed)
LFD 10/25/20 #30 with no refills LOV 04/13/20 NOV none

## 2020-11-25 HISTORY — PX: CERVICAL ABLATION: SHX5771

## 2020-12-05 ENCOUNTER — Telehealth (INDEPENDENT_AMBULATORY_CARE_PROVIDER_SITE_OTHER): Payer: BC Managed Care – PPO | Admitting: Physician Assistant

## 2020-12-05 ENCOUNTER — Encounter: Payer: Self-pay | Admitting: Physician Assistant

## 2020-12-05 ENCOUNTER — Other Ambulatory Visit: Payer: Self-pay

## 2020-12-05 DIAGNOSIS — J069 Acute upper respiratory infection, unspecified: Secondary | ICD-10-CM | POA: Diagnosis not present

## 2020-12-05 NOTE — Progress Notes (Signed)
Virtual Visit via Video   I connected with patient on 12/05/20 at  9:00 AM EST by a video enabled telemedicine application and verified that I am speaking with the correct person using two identifiers.  Location patient: Home Location provider: [facility] Persons participating in the virtual visit: Patient, Provider, CMA (Patina Moore)  I discussed the limitations of evaluation and management by telemedicine and the availability of in person appointments. The patient expressed understanding and agreed to proceed.  Subjective:   HPI:   Patient presents via [facility] today c/o URI symptoms starting Saturday. Notes waking up with a severe sore throat associated with nasal congestion and post-nasal drip. Hydrated and went back to bed. Woke up with worsening headache and fatigue. Denies fevers or chills.Denies chest pain or SOB. Denies loss of taste or smell. Took an at-home test for COVId which was negative. Stats yesterday she had a PCR test done with pending results. Is COVID vaccinated.  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 No known COVID exposure.   ROS:   See pertinent positives and negatives per HPI.  Patient Active Problem List   Diagnosis Date Noted  . Obesity (BMI 30-39.9) 03/29/2019  . Encounter for chronic pain management 10/05/2018  . Cervicalgia 06/29/2018  . Flu-like symptoms 12/05/2015  . Sore throat 04/02/2015  . Screening for malignant neoplasm of cervix 09/20/2013  . Physical exam 09/20/2013  . Abdominal pain, epigastric 09/08/2013  . S/P cervical discectomy 06/25/2013  . Trapezius muscle spasm 12/11/2012  . Shoulder pain 12/11/2012  . Skin tag 11/19/2012  . Keratosis 11/19/2012  . 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 Cough 03/11/2011  . Anxiety state 08/14/2010  . ELEVATED BP W/O HYPERTENSION 08/04/2009  . ROSACEA 07/25/2009  . MITRAL REGURGITATION 02/18/2009  . GERD 02/18/2009  . LUPUS 02/18/2009  . PALPITATIONS 02/18/2009     Social History   Tobacco Use  . Smoking status: Never Smoker  . Smokeless tobacco: Never Used  Substance Use Topics  . Alcohol use: No    Current Outpatient Medications:  .  albuterol (PROVENTIL HFA) 108 (90 Base) MCG/ACT inhaler, Inhale 2 puffs into the lungs every 4 (four) hours as needed for wheezing., Disp: 1 Inhaler, Rfl: 12 .  ALPRAZolam (XANAX) 0.25 MG tablet, TAKE 1 TABLET(0.25 MG) BY MOUTH TWICE DAILY AS NEEDED, Disp: 30 tablet, Rfl: 3 .  cyclobenzaprine (FLEXERIL) 10 MG tablet, TAKE 1 TABLET(10 MG) BY MOUTH THREE TIMES DAILY AS NEEDED FOR MUSCLE SPASMS, Disp: 30 tablet, Rfl: 3 .  EPINEPHrine 0.3 mg/0.3 mL IJ SOAJ injection, Inject 0.3 mLs (0.3 mg total) into the muscle once., Disp: 1 Device, Rfl: 2 .  ondansetron (ZOFRAN) 4 MG tablet, Take 1 tablet (4 mg total) by mouth every 8 (eight) hours as needed for nausea or vomiting., Disp: 20 tablet, Rfl: 0 .  oxyCODONE-acetaminophen (PERCOCET/ROXICET) 5-325 MG tablet, Take 1 tablet by mouth every 4 (four) hours as needed., Disp: 30 tablet, Rfl: 0 .  predniSONE (DELTASONE) 10 MG tablet, 3 tabs x3 days and then 2 tabs x3 days and then 1 tab x3 days.  Take w/ food., Disp: 18 tablet, Rfl: 0 .  vitamin C (ASCORBIC ACID) 500 MG tablet, Take 500 mg by mouth 2 (two) times daily., Disp: , Rfl:  .  zinc gluconate 50 MG tablet, Take 50 mg by mouth daily., Disp: , Rfl:   Allergies  Allergen Reactions  . Flovent [Fluticasone Propionate]     Difficulty breathing. No issues with  Qvar Rxed by Dr Shelle Iron  . Codeine     REACTION: vomiting  . Mushroom Extract Complex Swelling    Pt reports hand swelling when consuming mushrooms.   . Other     Bee stings and pecan nuts     Objective:   There were no vitals taken for this visit.  Patient is well-developed, well-nourished in no acute distress.  Resting comfortably at home.  Head is normocephalic, atraumatic.  No labored breathing.  Speech is clear and coherent with logical content.  Patient  is alert and oriented at baseline.   Assessment and Plan:   1. Viral URI Suspected COVID giving significant headache and sore throat which are hallmark of the omnicron variant. Will have her continue to stay quarantined until PCR results are in. Increase fluids. Continue alternating tylenol and ibuprofen for headache and sore throat. Start salt-water gargles. Vitamin regimen discussed with patient. Restart Clartin and saline nasal rinse. Will alter treatment based on COVID results.     Piedad Climes, PA-C 12/05/2020

## 2020-12-05 NOTE — Progress Notes (Signed)
I have discussed the procedure for the virtual visit with the patient who has given consent to proceed with assessment and treatment.   Arthella Headings S Avamarie Crossley, CMA     

## 2020-12-05 NOTE — Patient Instructions (Signed)
Instructions sent to patients MyChart.

## 2020-12-07 ENCOUNTER — Encounter: Payer: Self-pay | Admitting: Family Medicine

## 2020-12-07 ENCOUNTER — Other Ambulatory Visit: Payer: Self-pay | Admitting: Family Medicine

## 2020-12-08 MED ORDER — ALPRAZOLAM 0.25 MG PO TABS: 0.2500 mg | ORAL_TABLET | Freq: Two times a day (BID) | ORAL | 0 refills | Status: DC | PRN

## 2020-12-08 MED ORDER — ALBUTEROL SULFATE HFA 108 (90 BASE) MCG/ACT IN AERS: 2.0000 | INHALATION_SPRAY | RESPIRATORY_TRACT | 12 refills | Status: DC | PRN

## 2020-12-08 NOTE — Telephone Encounter (Signed)
Indication for chronic opioid: Cervical Radiculopathy Medication and dose: Oxycodone-acetaminophen 5/325 # pills per month: 30 on 11/14/20 Last UDS date: none Opioid Treatment Agreement signed (Y/N): No Opioid Treatment Agreement last reviewed with patient:   [facility] reviewed this encounter (include red flags):

## 2020-12-08 NOTE — Telephone Encounter (Signed)
Xanax last rx 08/02/20 #30 3 RF LOV: 12/05/20 URI-[place] No CSC

## 2020-12-11 MED ORDER — OXYCODONE-ACETAMINOPHEN 5-325 MG PO TABS
1.0000 | ORAL_TABLET | ORAL | 0 refills | Status: DC | PRN
Start: 2020-12-11 — End: 2021-02-02

## 2021-01-01 ENCOUNTER — Other Ambulatory Visit: Payer: Self-pay | Admitting: Family Medicine

## 2021-01-02 NOTE — Telephone Encounter (Signed)
Requesting:Oxycodone 5-325mg  UDS: Last Visit:04/13/20 Next Visit:n/a Last Refill:12/11/20 30 caps 0 refills Please Advise'

## 2021-01-09 ENCOUNTER — Other Ambulatory Visit: Payer: Self-pay | Admitting: Physician Assistant

## 2021-01-11 ENCOUNTER — Other Ambulatory Visit: Payer: Self-pay | Admitting: Physician Assistant

## 2021-01-11 DIAGNOSIS — F411 Generalized anxiety disorder: Secondary | ICD-10-CM

## 2021-01-12 MED ORDER — ALPRAZOLAM 0.25 MG PO TABS: 0.2500 mg | ORAL_TABLET | Freq: Two times a day (BID) | ORAL | 0 refills | Status: DC | PRN

## 2021-01-12 NOTE — Telephone Encounter (Signed)
Xanax last rx 12/08/20 #30 LOV: 12/05/20 URI

## 2021-01-18 ENCOUNTER — Encounter: Payer: Self-pay | Admitting: Family Medicine

## 2021-02-02 ENCOUNTER — Other Ambulatory Visit: Payer: Self-pay | Admitting: Family Medicine

## 2021-02-05 MED ORDER — OXYCODONE-ACETAMINOPHEN 5-325 MG PO TABS: 1.0000 | ORAL_TABLET | ORAL | 0 refills | Status: DC | PRN

## 2021-02-05 NOTE — Telephone Encounter (Signed)
Requesting:Percocet 5-325mg  Contract: UDS: Last Visit:12/05/20 v/v Malva Cogan Next Visit:n/a Last Refill:12/11/20 30 tabs 0 refills  Please Advise

## 2021-02-07 LAB — HM PAP SMEAR

## 2021-02-13 ENCOUNTER — Other Ambulatory Visit: Payer: Self-pay | Admitting: Family Medicine

## 2021-02-13 DIAGNOSIS — F411 Generalized anxiety disorder: Secondary | ICD-10-CM

## 2021-02-14 ENCOUNTER — Encounter: Payer: Self-pay | Admitting: Family Medicine

## 2021-02-14 DIAGNOSIS — F411 Generalized anxiety disorder: Secondary | ICD-10-CM

## 2021-02-14 MED ORDER — ALPRAZOLAM 0.25 MG PO TABS: 0.2500 mg | ORAL_TABLET | Freq: Two times a day (BID) | ORAL | 0 refills | Status: DC | PRN

## 2021-02-15 MED ORDER — ALPRAZOLAM 0.25 MG PO TABS: 0.2500 mg | ORAL_TABLET | Freq: Two times a day (BID) | ORAL | 0 refills | Status: DC | PRN

## 2021-02-19 ENCOUNTER — Encounter: Payer: Self-pay | Admitting: Family Medicine

## 2021-02-19 DIAGNOSIS — Z1211 Encounter for screening for malignant neoplasm of colon: Secondary | ICD-10-CM

## 2021-02-20 ENCOUNTER — Telehealth: Payer: Self-pay | Admitting: Family Medicine
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
## 2021-02-21 NOTE — Telephone Encounter (Signed)
Called patient and informed her that per Dr Beverely Low patient will need to have another Tdap vaccine and to watch the area for infection. Patient stated that she will call  us in the morning to come in and get vaccine.

## 2021-02-22 ENCOUNTER — Ambulatory Visit: Payer: BC Managed Care – PPO

## 2021-02-26 ENCOUNTER — Other Ambulatory Visit (HOSPITAL_BASED_OUTPATIENT_CLINIC_OR_DEPARTMENT_OTHER): Payer: Self-pay | Admitting: Family Medicine

## 2021-02-26 DIAGNOSIS — Z1231 Encounter for screening mammogram for malignant neoplasm of breast: Secondary | ICD-10-CM

## 2021-02-28 ENCOUNTER — Ambulatory Visit (HOSPITAL_BASED_OUTPATIENT_CLINIC_OR_DEPARTMENT_OTHER)
Admission: RE | Admit: 2021-02-28 | Discharge: 2021-02-28 | Disposition: A | Discharge status: Home / Self Care | Payer: BC Managed Care – PPO | Source: Ambulatory Visit | Attending: Family Medicine | Admitting: Family Medicine

## 2021-02-28 ENCOUNTER — Other Ambulatory Visit: Payer: Self-pay

## 2021-02-28 DIAGNOSIS — Z1231 Encounter for screening mammogram for malignant neoplasm of breast: Secondary | ICD-10-CM | POA: Insufficient documentation

## 2021-03-05 ENCOUNTER — Other Ambulatory Visit: Payer: Self-pay | Admitting: Family Medicine

## 2021-03-05 DIAGNOSIS — R928 Other abnormal and inconclusive findings on diagnostic imaging of breast: Secondary | ICD-10-CM

## 2021-03-07 ENCOUNTER — Other Ambulatory Visit: Payer: Self-pay | Admitting: Family Medicine

## 2021-03-07 ENCOUNTER — Encounter: Payer: Self-pay | Admitting: Family Medicine

## 2021-03-07 ENCOUNTER — Ambulatory Visit
Admission: RE | Admit: 2021-03-07 | Discharge: 2021-03-07 | Disposition: A | Discharge status: Home / Self Care | Payer: BC Managed Care – PPO | Source: Ambulatory Visit | Attending: Family Medicine | Admitting: Family Medicine

## 2021-03-07 ENCOUNTER — Other Ambulatory Visit: Payer: Self-pay

## 2021-03-07 DIAGNOSIS — R928 Other abnormal and inconclusive findings on diagnostic imaging of breast: Secondary | ICD-10-CM

## 2021-03-07 NOTE — Telephone Encounter (Signed)
Patient is requesting a refill of the following medications: Requested Prescriptions   Pending Prescriptions Disp Refills  . oxyCODONE-acetaminophen (PERCOCET/ROXICET) 5-325 MG tablet 30 tablet 0    Sig: Take 1 tablet by mouth every 4 (four) hours as needed. Last refill w/o appt.  Please call to schedule    Date of patient request: 03/07/2021 Last office visit: 12/05/2020 Date of last refill: 02/05/2021 Last refill amount: 30 tablets  Follow up time period per chart: none

## 2021-03-07 NOTE — Telephone Encounter (Signed)
I reviewed pt's controlled substance database information and she's been getting tylenol with codeine from [facility] in addition to the Oxycodone she has been getting from [place].  This violates our controlled substance prescribing policy- not to mention is concerning for double dosing tylenol and opiates.  No refills at this time

## 2021-03-08 ENCOUNTER — Other Ambulatory Visit: Payer: Self-pay

## 2021-03-08 MED ORDER — ONDANSETRON HCL 4 MG PO TABS: 4.0000 mg | ORAL_TABLET | Freq: Three times a day (TID) | ORAL | 0 refills | Status: DC | PRN

## 2021-03-08 NOTE — Telephone Encounter (Signed)
Patient medication was sent to the pharmacy 

## 2021-03-08 NOTE — Telephone Encounter (Signed)
Called patient and informed her why the medication was refilled. Patient also states she is going out of town and had to leave work with a fever of 100.1 and vomiting I offered her an appointment she she declined. Also she asked about an Covid test from the symptoms and I tried to give her the number to sent up an appointment she said it was to early for the test and only wants some zofran . Please Advise

## 2021-03-12 ENCOUNTER — Telehealth: Payer: Self-pay

## 2021-03-12 NOTE — Telephone Encounter (Signed)
Called and spoke with patient to see how she is doing. Patient states that she is doing much better than she was before. Thursday and Friday of last week was bad but she has come around to relieving symptoms. Husband has now tested positive and her daughter is not, but is in quarantine. I advised patient to test daughter again if no symptoms show within 5 days. Patient agreed. I also advised patient to call office back if there is anything else we can do.

## 2021-03-15 ENCOUNTER — Encounter: Payer: Self-pay | Admitting: Family Medicine

## 2021-03-15 NOTE — Telephone Encounter (Signed)
Please advise message below  °

## 2021-03-23 ENCOUNTER — Other Ambulatory Visit: Payer: Self-pay | Admitting: Family Medicine

## 2021-03-23 DIAGNOSIS — F411 Generalized anxiety disorder: Secondary | ICD-10-CM

## 2021-03-23 NOTE — Telephone Encounter (Signed)
LFD for oxycodone 02/05/21 #30 with no refills LFD for alprazolam 02/15/21 #30 with no refills LOV 12/05/20 NOV none

## 2021-03-26 MED ORDER — ALPRAZOLAM 0.25 MG PO TABS: 0.2500 mg | ORAL_TABLET | Freq: Two times a day (BID) | ORAL | 0 refills | Status: DC | PRN

## 2021-03-26 MED ORDER — OXYCODONE-ACETAMINOPHEN 5-325 MG PO TABS: 1.0000 | ORAL_TABLET | ORAL | 0 refills | Status: DC | PRN

## 2021-03-27 ENCOUNTER — Encounter: Payer: Self-pay | Admitting: Family Medicine

## 2021-03-31 ENCOUNTER — Encounter: Payer: Self-pay | Admitting: Family Medicine

## 2021-03-31 ENCOUNTER — Telehealth (INDEPENDENT_AMBULATORY_CARE_PROVIDER_SITE_OTHER): Payer: BC Managed Care – PPO | Admitting: Family Medicine

## 2021-03-31 VITALS — HR 100 | Temp 100.0°F | Ht 64.0 in | Wt 188.0 lb

## 2021-03-31 DIAGNOSIS — H1033 Unspecified acute conjunctivitis, bilateral: Secondary | ICD-10-CM

## 2021-03-31 DIAGNOSIS — M62838 Other muscle spasm: Secondary | ICD-10-CM

## 2021-03-31 MED ORDER — POLYMYXIN B-TRIMETHOPRIM 10000-0.1 UNIT/ML-% OP SOLN: 1.0000 [drp] | Freq: Four times a day (QID) | OPHTHALMIC | 0 refills | Status: DC

## 2021-03-31 MED ORDER — CYCLOBENZAPRINE HCL 10 MG PO TABS: 10.0000 mg | ORAL_TABLET | Freq: Three times a day (TID) | ORAL | 0 refills | Status: DC | PRN

## 2021-03-31 NOTE — Progress Notes (Signed)
Virtual Visit via Video Note  I connected with Lynn Campos on 03/31/21 at 10:00 AM EDT by a video enabled telemedicine application and verified that I am speaking with the correct person using two identifiers. Location patient: home Location provider: work or home office Persons participating in the virtual visit: patient, provider  I discussed the limitations of evaluation and management by telemedicine and the availability of in person appointments. The patient expressed understanding and agreed to proceed.  Chief Complaint  Patient presents with  . Conjunctivitis    Pt c/o POSSIBLE PINK EYE, starting 3 days ago.  Pt said Friday morning woke up with crusty eye, it feels irritated and itchy, draining yellow all day yesterday. Pt said it started in her rt eye, and she does wear contacts.    HPI: Lynn Campos is a 50 y.o. female who complains of 3 day h/o Rt eye irritation, itch. She woke up yesterday with crusting of her Rt eye and yellow drainage. Lt eye now starting with the irritation/redness. 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 Pt wears contacts - daily disposable. Denies fever, chills, headache, URI symptoms.  Pt also requests refill of flexeril which PCP last Rx'd in 10/2020 for on/off neck muscle spasms.   Past Medical History:  Diagnosis Date  . Allergy   . Anemia    in the past  . Anxiety   . Arthritis    neck  . Asthma   . Dyspnea   . GERD (gastroesophageal reflux disease)   . Heart murmur   . Lupus (HCC)   . Mitral regurgitation   . Palpitations   . Rosacea   . Shingles     Past Surgical History:  Procedure Laterality Date  . APPENDECTOMY    . CERVICAL SPINE SURGERY  01/2010  . CHOLECYSTECTOMY  05/2010  . ROTATOR CUFF REPAIR     left  . SIGMOIDOSCOPY      Family History  Problem Relation Age of Onset  . Coronary artery disease Mother   . Hypertension Mother   . Stroke Mother   . Rheum arthritis Mother   . Coronary artery  disease Father   . Allergies Sister   . Asthma Sister   . Rheum arthritis Maternal Grandmother   . Colon cancer Neg Hx   . Esophageal cancer Neg Hx   . Rectal cancer Neg Hx   . Stomach cancer Neg Hx     Social History   Tobacco Use  . Smoking status: Never Smoker  . Smokeless tobacco: Never Used  Vaping Use  . Vaping Use: Never used  Substance Use Topics  . Alcohol use: No  . Drug use: No     Current Outpatient Medications:  .  albuterol (PROVENTIL HFA) 108 (90 Base) MCG/ACT inhaler, Inhale 2 puffs into the lungs every 4 (four) hours as needed for wheezing., Disp: 1 each, Rfl: 12 .  ALPRAZolam (XANAX) 0.25 MG tablet, Take 1 tablet (0.25 mg total) by mouth 2 (two) times daily as needed for anxiety., Disp: 30 tablet, Rfl: 0 .  cyclobenzaprine (FLEXERIL) 10 MG tablet, TAKE 1 TABLET(10 MG) BY MOUTH THREE TIMES DAILY AS NEEDED FOR MUSCLE SPASMS, Disp: 30 tablet, Rfl: 3 .  EPINEPHrine 0.3 mg/0.3 mL IJ SOAJ injection, Inject 0.3 mLs (0.3 mg total) into the muscle once., Disp: 1 Device, Rfl: 2 .  ondansetron (ZOFRAN) 4 MG tablet, Take 1 tablet (4 mg total) by mouth every 8 (eight)  hours as needed for nausea or vomiting., Disp: 30 tablet, Rfl: 0 .  oxyCODONE-acetaminophen (PERCOCET/ROXICET) 5-325 MG tablet, Take 1 tablet by mouth every 4 (four) hours as needed. Last refill w/o appt.  Please call to schedule, Disp: 30 tablet, Rfl: 0 .  vitamin C (ASCORBIC ACID) 500 MG tablet, Take 500 mg by mouth 2 (two) times daily., Disp: , Rfl:  .  diazepam (VALIUM) 2 MG tablet, TAKE 1 TABLET BY MOUTH 1 HOUR PRIOR TO MRI SCAN AND REPEAT AS NEEDED FOR CLAUSTROPHOBIA, Disp: , Rfl:  .  zinc gluconate 50 MG tablet, Take 50 mg by mouth daily. (Patient not taking: Reported on 03/31/2021), Disp: , Rfl:   Allergies  Allergen Reactions  . Flovent [Fluticasone Propionate]     Difficulty breathing. No issues with Qvar Rxed by Dr Shelle Iron  . Codeine     REACTION: vomiting  . Mushroom Extract Complex Swelling    Pt  reports hand swelling when consuming mushrooms.   . Other     Bee stings and pecan nuts       ROS: See pertinent positives and negatives per HPI.   EXAM:  VITALS per patient if applicable: Pulse 100   Temp 100 F (37.8 C) (Oral)   Ht 5\' 4"  (1.626 m)   Wt 188 lb (85.3 kg)   LMP 03/10/2021   SpO2 97%   BMI 32.27 kg/m   GENERAL: alert, oriented, appears well and in no acute distress  EYES: pt wearing glasses, mild conjunctival erythema Rt > Lt, difficult to assess presence or amount of discharge virtually  NECK: normal movements of the head and neck  LUNGS: on inspection no signs of respiratory distress, breathing rate appears normal, no obvious gross SOB, gasping or wheezing, no conversational dyspnea  CV: no obvious cyanosis  PSYCH/NEURO: pleasant and cooperative, speech and thought processing grossly intact   ASSESSMENT AND PLAN:  1. 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  Dispense: 10 mL; Refill: 0 - dicussed hygiene and eye care - f/u PRN  2. Trapezius muscle spasm Refill: - cyclobenzaprine (FLEXERIL) 10 MG tablet; Take 1 tablet (10 mg total) by mouth 3 (three) times daily as needed for muscle spasms.  Dispense: 30 tablet; Refill: 0   I discussed the assessment and treatment plan with the patient. The patient was provided an opportunity to ask questions and all were answered. The patient agreed with the plan and demonstrated an understanding of the instructions.   The patient was advised to call back or seek an in-person evaluation if the symptoms worsen or if the condition fails to improve as anticipated.   03/12/2021, DO

## 2021-03-31 NOTE — Patient Instructions (Signed)
Health Maintenance Due  Topic Date Due  . Hepatitis C Screening  Never done  . COLONOSCOPY (Pts 45-23yrs Insurance coverage will need to be confirmed)  Never done  . PAP SMEAR-Modifier  05/15/2019  . COVID-19 Vaccine (4 - Booster for Pfizer series) 01/25/2021    Depression screen [facility] 2/9 04/13/2020 10/29/2019 03/29/2019  Decreased Interest 0 1 0  Down, Depressed, Hopeless 0 0 0  PHQ - 2 Score 0 1 0  Altered sleeping - 0 0  Tired, decreased energy - 0 0  Change in appetite - 0 0  Feeling bad or failure about yourself  - 0 0  Trouble concentrating - 0 0  Moving slowly or fidgety/restless - 0 0  Suicidal thoughts - 0 0  PHQ-9 Score - 1 0  Difficult doing work/chores - Not difficult at all Not difficult at all

## 2021-04-20 ENCOUNTER — Encounter: Payer: Self-pay | Admitting: Family Medicine

## 2021-04-20 ENCOUNTER — Other Ambulatory Visit: Payer: Self-pay | Admitting: Family Medicine

## 2021-04-20 DIAGNOSIS — F411 Generalized anxiety disorder: Secondary | ICD-10-CM

## 2021-04-20 MED ORDER — OXYCODONE-ACETAMINOPHEN 5-325 MG PO TABS: 1.0000 | ORAL_TABLET | ORAL | 0 refills | Status: DC | PRN

## 2021-04-20 MED ORDER — ALPRAZOLAM 0.25 MG PO TABS: 0.2500 mg | ORAL_TABLET | Freq: Two times a day (BID) | ORAL | 0 refills | Status: DC | PRN

## 2021-04-20 NOTE — Telephone Encounter (Signed)
Oxycodone LFD 03/26/21 #30 with no refills Aprazolam LFD 03/26/21 #30 with no refills LOV 12/05/20 NOV none

## 2021-05-01 ENCOUNTER — Encounter: Payer: Self-pay | Admitting: Family Medicine

## 2021-05-01 ENCOUNTER — Telehealth (INDEPENDENT_AMBULATORY_CARE_PROVIDER_SITE_OTHER): Payer: BC Managed Care – PPO | Admitting: Family Medicine

## 2021-05-01 ENCOUNTER — Ambulatory Visit (HOSPITAL_BASED_OUTPATIENT_CLINIC_OR_DEPARTMENT_OTHER)
Admission: RE | Admit: 2021-05-01 | Discharge: 2021-05-01 | Disposition: A | Discharge status: Home / Self Care | Payer: BC Managed Care – PPO | Source: Ambulatory Visit | Attending: Family Medicine | Admitting: Family Medicine

## 2021-05-01 ENCOUNTER — Other Ambulatory Visit: Payer: Self-pay

## 2021-05-01 DIAGNOSIS — Z20822 Contact with and (suspected) exposure to covid-19: Secondary | ICD-10-CM | POA: Diagnosis not present

## 2021-05-01 DIAGNOSIS — B9689 Other specified bacterial agents as the cause of diseases classified elsewhere: Secondary | ICD-10-CM

## 2021-05-01 DIAGNOSIS — R0781 Pleurodynia: Secondary | ICD-10-CM

## 2021-05-01 DIAGNOSIS — J329 Chronic sinusitis, unspecified: Secondary | ICD-10-CM | POA: Diagnosis not present

## 2021-05-01 MED ORDER — PROMETHAZINE-DM 6.25-15 MG/5ML PO SYRP: 5.0000 mL | ORAL_SOLUTION | Freq: Four times a day (QID) | ORAL | 0 refills | Status: DC | PRN

## 2021-05-01 MED ORDER — AMOXICILLIN-POT CLAVULANATE 875-125 MG PO TABS: 1.0000 | ORAL_TABLET | Freq: Two times a day (BID) | ORAL | 0 refills | Status: DC

## 2021-05-01 MED ORDER — ALBUTEROL SULFATE HFA 108 (90 BASE) MCG/ACT IN AERS: 2.0000 | INHALATION_SPRAY | RESPIRATORY_TRACT | 12 refills | Status: DC | PRN

## 2021-05-01 NOTE — Progress Notes (Signed)
I connected with  Lynn Campos on 05/01/21 by a video enabled telemedicine application and verified that I am speaking with the correct person using two identifiers.   I discussed the limitations of evaluation and management by telemedicine. The patient expressed understanding and agreed to proceed.

## 2021-05-01 NOTE — Progress Notes (Signed)
Virtual Visit via Video   I connected with patient on 05/01/21 at  9:30 AM EDT by a video enabled telemedicine application and verified that I am speaking with the correct person using two identifiers.  Location patient: Home Location provider: [place], Office Persons participating in the virtual visit: Patient, Provider, CMA (Sabrina M)  I discussed the limitations of evaluation and management by telemedicine and the availability of in person appointments. The patient expressed understanding and agreed to proceed.  Subjective:   HPI:   URI- 'I feel like it's bronchitis or pneumonia'.  Increased inhaler use since 6 days ago.  Has consistent pain from under L bra line that radiates to the back.  'it feels like I can't take a deep breath'.  Saturday night developed fever.  Now has facial pain/pressure.  + HAs.  + body aches when febrile.  + sick contacts.  Pt tested + for COVID in mid-April.  'it feels a lot like COVID'.  Did home test yesterday and it was negative.  Pt did ride to [place] over the weekend to see her daughter and had some ankle swelling- which has since resolved.    ROS:   See pertinent positives and negatives per HPI.  Patient Active Problem List   Diagnosis Date Noted  . Osteoarthritis of left knee 05/31/2020  . Obesity (BMI 30-39.9) 03/29/2019  . Encounter for chronic pain management 10/05/2018  . Cervicalgia 06/29/2018  . Flu-like symptoms 12/05/2015  . Sore throat 04/02/2015  . Screening for malignant neoplasm of cervix 09/20/2013  . Physical exam 09/20/2013  . Abdominal pain, epigastric 09/08/2013  . S/P cervical discectomy 06/25/2013  . Trapezius muscle spasm 12/11/2012  . Shoulder pain 12/11/2012  . Skin tag 11/19/2012  . Keratosis 11/19/2012  . 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 Cough 03/11/2011  . Anxiety state 08/14/2010  . ELEVATED BP W/O HYPERTENSION 08/04/2009  . ROSACEA 07/25/2009  . MITRAL REGURGITATION 02/18/2009  . GERD  02/18/2009  . LUPUS 02/18/2009  . PALPITATIONS 02/18/2009    Social History   Tobacco Use  . Smoking status: Never Smoker  . Smokeless tobacco: Never Used  Substance Use Topics  . Alcohol use: No    Current Outpatient Medications:  .  albuterol (PROVENTIL HFA) 108 (90 Base) MCG/ACT inhaler, Inhale 2 puffs into the lungs every 4 (four) hours as needed for wheezing., Disp: 1 each, Rfl: 12 .  ALPRAZolam (XANAX) 0.25 MG tablet, Take 1 tablet (0.25 mg total) by mouth 2 (two) times daily as needed for anxiety., Disp: 30 tablet, Rfl: 0 .  cyclobenzaprine (FLEXERIL) 10 MG tablet, Take 1 tablet (10 mg total) by mouth 3 (three) times daily as needed for muscle spasms., Disp: 30 tablet, Rfl: 0 .  EPINEPHrine 0.3 mg/0.3 mL IJ SOAJ injection, Inject 0.3 mLs (0.3 mg total) into the muscle once., Disp: 1 Device, Rfl: 2 .  ondansetron (ZOFRAN) 4 MG tablet, Take 1 tablet (4 mg total) by mouth every 8 (eight) hours as needed for nausea or vomiting., Disp: 30 tablet, Rfl: 0 .  oxyCODONE-acetaminophen (PERCOCET/ROXICET) 5-325 MG tablet, Take 1 tablet by mouth every 4 (four) hours as needed. Last refill w/o appt.  Please call to schedule, Disp: 30 tablet, Rfl: 0 .  vitamin C (ASCORBIC ACID) 500 MG tablet, Take 500 mg by mouth 2 (two) times daily., Disp: , Rfl:  .  diazepam (VALIUM) 2 MG tablet, TAKE 1 TABLET BY MOUTH 1 HOUR PRIOR TO MRI SCAN AND REPEAT AS NEEDED FOR  CLAUSTROPHOBIA (Patient not taking: Reported on 05/01/2021), Disp: , Rfl:  .  trimethoprim-polymyxin b (POLYTRIM) ophthalmic solution, Place 1 drop into both eyes every 6 (six) hours. (Patient not taking: Reported on 05/01/2021), Disp: 10 mL, Rfl: 0 .  zinc gluconate 50 MG tablet, Take 50 mg by mouth daily. (Patient not taking: No sig reported), Disp: , Rfl:   Allergies  Allergen Reactions  . Flovent [Fluticasone Propionate]     Difficulty breathing. No issues with Qvar Rxed by Dr Shelle Iron  . Codeine     REACTION: vomiting  . Fluticasone Propionate  (Inhal)   . Mushroom Extract Complex Swelling    Pt reports hand swelling when consuming mushrooms.   . Other     Bee stings and pecan nuts     Objective:   There were no vitals taken for this visit. AAOx3, NAD NCAT, EOMI No obvious CN deficits Coloring WNL Pt is able to speak clearly, coherently without shortness of breath or increased work of breathing.  + hacking cough Thought process is linear.  Mood is appropriate.   Assessment and Plan:   Suspected COVID- pt had COVID ~7 weeks ago.  She now has all the same sxs and admits this feels very similar to when she had COVID in April.  She has known exposures in the classroom.  She had a negative home test but encouraged her to retest or get a PCR test.  Pt is agreeable.  Pleuritic CP- new.  Discussed that COVID can lead to blood clots after infxn and that her pleuritic pain is a bit concerning.  She states she doesn't have any evidence of DVT at this time but that doesn't exclude a possible PE.  Will get CXR to assess for PNA (which can also cause pleuritic pain) and if she has PNA, we can worry less about PE.  If no PNA or obvious cause for pleuritic CP, we need to proceed w/ workup.  Pt expressed understanding and is in agreement w/ plan.   Sinusitis- new.  Start abx in case of bacterial infxn.  Reviewed supportive care and red flags that should prompt return.  Pt expressed understanding and is in agreement w/ plan.    Neena Rhymes, MD 05/01/2021

## 2021-05-02 ENCOUNTER — Other Ambulatory Visit: Payer: Self-pay | Admitting: Family Medicine

## 2021-05-02 DIAGNOSIS — M62838 Other muscle spasm: Secondary | ICD-10-CM

## 2021-05-02 NOTE — Telephone Encounter (Signed)
Refill request for Flexeril  Please review request and advise.  Thanks. Dm/cma

## 2021-05-03 ENCOUNTER — Encounter: Payer: Self-pay | Admitting: Family Medicine

## 2021-05-03 MED ORDER — PREDNISONE 10 MG PO TABS: ORAL_TABLET | ORAL | 0 refills | Status: DC

## 2021-05-03 MED ORDER — CYCLOBENZAPRINE HCL 10 MG PO TABS: 10.0000 mg | ORAL_TABLET | Freq: Three times a day (TID) | ORAL | 0 refills | Status: DC | PRN

## 2021-05-04 ENCOUNTER — Other Ambulatory Visit: Payer: Self-pay

## 2021-05-04 DIAGNOSIS — M62838 Other muscle spasm: Secondary | ICD-10-CM

## 2021-05-04 MED ORDER — PREDNISONE 10 MG PO TABS: ORAL_TABLET | ORAL | 0 refills | Status: DC

## 2021-05-04 MED ORDER — CYCLOBENZAPRINE HCL 10 MG PO TABS: 10.0000 mg | ORAL_TABLET | Freq: Three times a day (TID) | ORAL | 0 refills | Status: DC | PRN

## 2021-05-04 NOTE — Telephone Encounter (Signed)
Prescriptions resent

## 2021-05-05 ENCOUNTER — Other Ambulatory Visit: Payer: Self-pay

## 2021-05-16 ENCOUNTER — Other Ambulatory Visit: Payer: Self-pay | Admitting: Family Medicine

## 2021-05-16 DIAGNOSIS — M62838 Other muscle spasm: Secondary | ICD-10-CM

## 2021-05-16 NOTE — Telephone Encounter (Signed)
LFD for oxycodone 04/20/21 #30 with no refills LFD for cyclobenzaprine 05/04/21 #30 with no refills LOV 05/01/21 NOV none

## 2021-05-17 MED ORDER — OXYCODONE-ACETAMINOPHEN 5-325 MG PO TABS: 1.0000 | ORAL_TABLET | ORAL | 0 refills | Status: DC | PRN

## 2021-05-17 MED ORDER — CYCLOBENZAPRINE HCL 10 MG PO TABS: 10.0000 mg | ORAL_TABLET | Freq: Three times a day (TID) | ORAL | 0 refills | Status: DC | PRN

## 2021-05-23 ENCOUNTER — Encounter: Payer: Self-pay | Admitting: Gastroenterology

## 2021-05-23 ENCOUNTER — Other Ambulatory Visit: Payer: Self-pay | Admitting: Family Medicine

## 2021-05-23 ENCOUNTER — Encounter: Payer: Self-pay | Admitting: *Deleted

## 2021-05-23 DIAGNOSIS — F411 Generalized anxiety disorder: Secondary | ICD-10-CM

## 2021-05-23 MED ORDER — ALPRAZOLAM 0.25 MG PO TABS: 0.2500 mg | ORAL_TABLET | Freq: Two times a day (BID) | ORAL | 0 refills | Status: DC | PRN

## 2021-05-23 NOTE — Telephone Encounter (Signed)
LFD 04/20/21 #30 with no refills LOV 05/01/21 NOV 05/31/21

## 2021-05-31 ENCOUNTER — Other Ambulatory Visit: Payer: Self-pay

## 2021-05-31 ENCOUNTER — Encounter: Payer: Self-pay | Admitting: Family Medicine

## 2021-05-31 ENCOUNTER — Ambulatory Visit: Payer: BC Managed Care – PPO | Admitting: Family Medicine

## 2021-05-31 VITALS — BP 128/85 | HR 100 | Temp 98.2°F | Resp 18 | Ht 64.0 in | Wt 191.4 lb

## 2021-05-31 DIAGNOSIS — M542 Cervicalgia: Secondary | ICD-10-CM

## 2021-05-31 DIAGNOSIS — G8929 Other chronic pain: Secondary | ICD-10-CM

## 2021-05-31 DIAGNOSIS — E669 Obesity, unspecified: Secondary | ICD-10-CM

## 2021-05-31 LAB — CBC WITH DIFFERENTIAL/PLATELET
Basophils Absolute: 0 10*3/uL (ref 0.0–0.1)
Basophils Relative: 0.8 % (ref 0.0–3.0)
Eosinophils Absolute: 0.2 10*3/uL (ref 0.0–0.7)
Eosinophils Relative: 4.3 % (ref 0.0–5.0)
HCT: 40.6 % (ref 36.0–46.0)
Hemoglobin: 13.4 g/dL (ref 12.0–15.0)
Lymphocytes Relative: 24.1 % (ref 12.0–46.0)
Lymphs Abs: 1 10*3/uL (ref 0.7–4.0)
MCHC: 33.1 g/dL (ref 30.0–36.0)
MCV: 88.6 fl (ref 78.0–100.0)
Monocytes Absolute: 0.3 10*3/uL (ref 0.1–1.0)
Monocytes Relative: 6.5 % (ref 3.0–12.0)
Neutro Abs: 2.8 10*3/uL (ref 1.4–7.7)
Neutrophils Relative %: 64.3 % (ref 43.0–77.0)
Platelets: 227 10*3/uL (ref 150.0–400.0)
RBC: 4.58 Mil/uL (ref 3.87–5.11)
RDW: 14.1 % (ref 11.5–15.5)
WBC: 4.3 10*3/uL (ref 4.0–10.5)

## 2021-05-31 LAB — BASIC METABOLIC PANEL
BUN: 9 mg/dL (ref 6–23)
CO2: 25 mEq/L (ref 19–32)
Calcium: 8.9 mg/dL (ref 8.4–10.5)
Chloride: 105 mEq/L (ref 96–112)
Creatinine, Ser: 0.69 mg/dL (ref 0.40–1.20)
GFR: 99.88 mL/min (ref >60.00)
Glucose, Bld: 91 mg/dL (ref 70–99)
Potassium: 4.1 mEq/L (ref 3.5–5.1)
Sodium: 138 mEq/L (ref 135–145)

## 2021-05-31 LAB — HEPATIC FUNCTION PANEL
ALT: 19 U/L (ref 0–35)
AST: 16 U/L (ref 0–37)
Albumin: 4.3 g/dL (ref 3.5–5.2)
Alkaline Phosphatase: 51 U/L (ref 39–117)
Bilirubin, Direct: 0.1 mg/dL (ref 0.0–0.3)
Total Bilirubin: 0.3 mg/dL (ref 0.2–1.2)
Total Protein: 6.7 g/dL (ref 6.0–8.3)

## 2021-05-31 LAB — TSH: TSH: 1.43 u[IU]/mL (ref 0.35–5.50)

## 2021-05-31 LAB — LIPID PANEL
Cholesterol: 220 mg/dL — ABNORMAL HIGH (ref 0–200)
HDL: 63.1 mg/dL (ref >39.00)
LDL Cholesterol: 144 mg/dL — ABNORMAL HIGH (ref 0–99)
NonHDL: 157.32
Total CHOL/HDL Ratio: 3
Triglycerides: 66 mg/dL (ref 0.0–149.0)
VLDL: 13.2 mg/dL (ref 0.0–40.0)

## 2021-05-31 NOTE — Assessment & Plan Note (Signed)
Chronic problem.  Pt has hx of L sided pain but recently sustained a fall and had new R sided pain.  She saw Dr Yevette Edwards who indicated she should have surgery.  She is not interested in surgery at this time and prefers to medically manage as long as possible on the oxycodone and flexeril.  Will continue to prescribe until doses need to be titrated and at that point, she will need to return to neurosurg.  Pt expressed understanding and is in agreement w/ plan.

## 2021-05-31 NOTE — Assessment & Plan Note (Signed)
Discussed that pt is only to receive pain meds from 1 provider.  She is aware of this and apologized for the T3 she got from ortho.  I will continue to manage her Oxycodone until the dose needs to be adjusted and at that time, she will need to go back to neurosurgery or pain management.  Pt expressed understanding and is in agreement w/ plan.

## 2021-05-31 NOTE — Progress Notes (Signed)
   Subjective:    Patient ID: Lynn Campos, female    DOB: [birth year], 49 y.o.   MRN: [id number]  HPI Obesity- pt's BMI is now 32.85.  no recent labs.  No regular exercise.  Not following a particular diet plan.  Chronic pain/Cervicalgia- pt fell at work and 'immediately had symptoms down my R arm' which was new for pt.  See Dr Yetta Barre- neurosurg.  But since fall occurred at work she was forced to use [employer].  Saw Dr Yevette Edwards who recommended surgery.  Pt was not ready for that.  Did several months of PT.  At this time, pt prefers to continue pain meds and muscle relaxers rather than surgery.   Review of Systems For ROS see HPI   This visit occurred during the SARS-CoV-2 public health emergency.  Safety protocols were in place, including screening questions prior to the visit, additional usage of staff PPE, and extensive cleaning of exam room while observing appropriate contact time as indicated for disinfecting solutions.      Objective:   Physical Exam Vitals reviewed.  Constitutional:      General: She is not in acute distress.    Appearance: Normal appearance. She is not ill-appearing.  HENT:     Head: Normocephalic and atraumatic.  Eyes:     Extraocular Movements: Extraocular movements intact.     Conjunctiva/sclera: Conjunctivae normal.     Pupils: Pupils are equal, round, and reactive to light.  Cardiovascular:     Rate and Rhythm: Normal rate and regular rhythm.  Pulmonary:     Effort: Pulmonary effort is normal. No respiratory distress.     Breath sounds: Normal breath sounds. No wheezing.  Musculoskeletal:     Right lower leg: No edema.     Left lower leg: No edema.  Skin:    General: Skin is warm and dry.  Neurological:     General: No focal deficit present.     Mental Status: She is alert and oriented to person, place, and time.  Psychiatric:        Mood and Affect: Mood normal.        Behavior: Behavior normal.        Thought Content: Thought content normal.           Assessment & Plan:

## 2021-05-31 NOTE — Patient Instructions (Addendum)
Schedule your complete physical in 6 months We'll notify you of your lab results and make any changes if needed Continue to work on healthy diet and regular exercise- you can do it! We'll continue the Oxycodone and Flexeril as needed Call with any questions or concerns Stay Safe!  Stay Healthy! Have a great summer!!!

## 2021-05-31 NOTE — Assessment & Plan Note (Signed)
Pt's BMI now 32.85.  She has not had labs in years and we will get these to risk stratify.  Encouraged healthy diet and regular exercise.  Will continue to follow.

## 2021-06-05 ENCOUNTER — Other Ambulatory Visit: Payer: Self-pay | Admitting: Family Medicine

## 2021-06-05 DIAGNOSIS — M62838 Other muscle spasm: Secondary | ICD-10-CM

## 2021-06-05 NOTE — Telephone Encounter (Signed)
Oxycodone last filled 05/17/21 #30 with no refills Cyclobenzaprine last filled 05/17/21 #30 with no refills Last visit 05/31/21 Next visit none

## 2021-06-06 MED ORDER — CYCLOBENZAPRINE HCL 10 MG PO TABS: 10.0000 mg | ORAL_TABLET | Freq: Three times a day (TID) | ORAL | 0 refills | Status: DC | PRN

## 2021-06-06 MED ORDER — OXYCODONE-ACETAMINOPHEN 5-325 MG PO TABS: 1.0000 | ORAL_TABLET | ORAL | 0 refills | Status: DC | PRN

## 2021-06-18 ENCOUNTER — Encounter: Payer: Self-pay | Admitting: Nurse Practitioner

## 2021-06-18 ENCOUNTER — Telehealth (INDEPENDENT_AMBULATORY_CARE_PROVIDER_SITE_OTHER): Payer: BC Managed Care – PPO | Admitting: Nurse Practitioner

## 2021-06-18 VITALS — Ht 64.0 in | Wt 190.0 lb

## 2021-06-18 DIAGNOSIS — H18823 Corneal disorder due to contact lens, bilateral: Secondary | ICD-10-CM

## 2021-06-18 MED ORDER — CIPROFLOXACIN HCL 0.3 % OP SOLN: 2.0000 [drp] | Freq: Four times a day (QID) | OPHTHALMIC | 0 refills | Status: AC

## 2021-06-18 MED ORDER — SYSTANE COMPLETE 0.6 % OP SOLN
1.0000 [drp] | Freq: Three times a day (TID) | OPHTHALMIC | Status: DC | PRN
Start: 2021-06-18 — End: 2021-08-28

## 2021-06-18 NOTE — Patient Instructions (Signed)
Continue cold compress as needed Use cipro drops as prescribed to treat possible bacterial infection. Use systane drops as needed to provide additional eye lubrications (do not use at same time with cipro drops)  Corneal Abrasion  A corneal abrasion is a scratch or injury to the clear covering over the front of your eye (cornea). This can be painful. It is important to get treatment for a corneal abrasion. If this problem is not treated, it can affect your eyesight (vision). What are the causes? A poke in the eye. An object in the eye. Too much eye rubbing. Very dry eyes. Certain eye infections. Contact lenses that do not fit right or are worn for too long. You can also injure your cornea when putting contact lenses in your eye or taking them out. Eye surgery. Certain cornea problems may increase the chance of a corneal abrasion. Sometimes, the cause is not known. What are the signs or symptoms? Eye pain. The pain may get worse when you open and close your eye or when you move your eye. A feeling of something stuck in your eye. Tearing, redness, and sensitivity to light. Having trouble keeping your eye open, or not being able to keep it open. Blurred vision. Headache. How is this diagnosed? You may work with a health care provider who specializes in conditions of the eye (ophthalmologist). This condition may be diagnosed based on your medical history, symptoms, andan eye exam. How is this treated? Washing out your eye. Removing anything that is stuck in your eye. Using antibiotic drops or ointment to treat or prevent an infection. Using a dilating drop to decrease irritation, swelling, and pain. Using steroid drops or ointment to treat redness, irritation, or swelling. Applying a cold, wet cloth (cold compress) or ice pack to ease the pain Taking pain medicine by mouth. In some cases, an eye patch or bandage soft contact lens might also be used. An eye patch should not be used if  the corneal abrasion was related to contactlens wear as it can increase the chance of infection in these eyes. Follow these instructions at home: Medicines Use eye drops or ointments as told by your doctor. If you were prescribed antibiotic drops or ointment, use them as told by your doctor. Do not stop using the antibiotic even if you start to feel better. Take over-the-counter and prescription medicines only as told by your doctor. Ask your doctor if the medicine prescribed to you: Requires you to avoid driving or using heavy machinery. Can cause trouble pooping (constipation). You may need to take these actions to prevent or treat trouble pooping: Drink enough fluid to keep your pee (urine) pale yellow. Take over-the-counter or prescription medicines. Eat foods that are high in fiber. These include beans, whole grains, and fresh fruits and vegetables. Limit foods that are high in fat and processed sugars. These include fried or sweet foods. Using an eye patch If you have an eye patch, wear it as told by your doctor. Do not drive or use machinery while wearing an eye patch. Follow instructions from your doctor about when to take off the patch. General instructions Ask your doctor if you can use a cold, wet cloth on your eye to help with pain. Do not rub or touch your eye. Do not wash out your eye. Do not wear contact lenses until your doctor says that this is okay. Avoid bright light. Avoid straining your eyes. Keep all follow-up visits as told by your doctor. Doing this  can help to prevent infection and loss of eyesight. Contact a doctor if: You keep having eye pain and other symptoms for more than 2 days. You get new symptoms, such as more redness, watery eyes, or discharge. You have discharge that makes your eyelids stick together in the morning. Your eye patch becomes so loose that you can blink your eye. Symptoms come back after your eye heals. Get help right away if: You have  very bad eye pain that does not get better with medicine. You lose eyesight. Summary A corneal abrasion is a scratch or injury to the clear covering over the front of the eye (cornea). It is important to get treatment for a corneal abrasion. If this problem is not treated, it can affect your eyesight (vision). Use eye drops or ointments as told by your doctor. If you have an eye patch, do not drive or use machinery while wearing it. Let your doctor know if your symptoms last for more than 2 days. This information is not intended to replace advice given to you by your health care provider. Make sure you discuss any questions you have with your healthcare provider. Document Revised: 03/19/2019 Document Reviewed: 03/19/2019 Elsevier Patient Education  2022 [employer].

## 2021-06-18 NOTE — Progress Notes (Signed)
Virtual Visit via Video Note  I connected withNAME@ on 06/18/21 at  2:00 PM EDT by a video enabled telemedicine application and verified that I am speaking with the correct person using two identifiers.  Location: Patient:Home Provider: Office Participants: patient and provider  I discussed the limitations of evaluation and management by telemedicine and the availability of in person appointments. I also discussed with the patient that there may be a patient responsible charge related to this service. The patient expressed understanding and agreed to proceed.  CC:eye irritation since Saturday  History of Present Illness: Eye Pain  Both eyes are affected. This is a new problem. The current episode started in the past 7 days. The problem occurs constantly. The problem has been unchanged. The injury mechanism was contact lenses. The pain is mild. There is No known exposure to pink eye. She Wears contacts. Associated symptoms include an eye discharge, eye redness, a foreign body sensation and itching. Pertinent negatives include no blurred vision, double vision, fever, nausea, photophobia, recent URI or vomiting. She has tried water (warm compress) for the symptoms. The treatment provided mild relief.    Observations/Objective: Physical Exam Eyes:     General: Lids are normal.        Right eye: No hordeolum.        Left eye: No hordeolum.     Extraocular Movements: Extraocular movements intact.     Conjunctiva/sclera:     Right eye: Right conjunctiva is injected. No exudate.    Left eye: Left conjunctiva is injected. No exudate. Neurological:     Mental Status: She is alert.    Assessment and Plan: Denasia was seen today for acute visit.  Diagnoses and all orders for this visit:  Corneal abrasion due to contact lens, bilateral -     Propylene Glycol (SYSTANE COMPLETE) 0.6 % SOLN; Apply 1 drop to eye 3 (three) times daily as needed. -     ciprofloxacin (CILOXAN) 0.3 % ophthalmic  solution; Place 2 drops into both eyes every 6 (six) hours for 5 days. Use for 5days   Follow Up Instructions: Continue cold compress as needed Use cipro drops as prescribed to treat possible bacterial infection. Use systane drops as needed to provide additional eye lubrications (do not use at same time with cipro drops)   I discussed the assessment and treatment plan with the patient. The patient was provided an opportunity to ask questions and all were answered. The patient agreed with the plan and demonstrated an understanding of the instructions.   The patient was advised to call back or seek an in-person evaluation if the symptoms worsen or if the condition fails to improve as anticipated.  Alysia Penna, NP

## 2021-06-20 ENCOUNTER — Other Ambulatory Visit: Payer: Self-pay | Admitting: Family Medicine

## 2021-06-20 DIAGNOSIS — M62838 Other muscle spasm: Secondary | ICD-10-CM

## 2021-06-20 DIAGNOSIS — F411 Generalized anxiety disorder: Secondary | ICD-10-CM

## 2021-06-20 MED ORDER — ALPRAZOLAM 0.25 MG PO TABS: 0.2500 mg | ORAL_TABLET | Freq: Two times a day (BID) | ORAL | 0 refills | Status: DC | PRN

## 2021-06-20 MED ORDER — CYCLOBENZAPRINE HCL 10 MG PO TABS: 10.0000 mg | ORAL_TABLET | Freq: Three times a day (TID) | ORAL | 0 refills | Status: DC | PRN

## 2021-06-20 NOTE — Telephone Encounter (Signed)
Alprazolam LFD 05/23/21 #30 with no refills Cyclobenzaprine LFD 06/06/21 #30 with no refills LOV 05/31/21 NOV none

## 2021-06-28 ENCOUNTER — Other Ambulatory Visit: Payer: Self-pay | Admitting: Family Medicine

## 2021-06-28 NOTE — Telephone Encounter (Signed)
LFD 06/06/21 #30 with no refills LOV 05/31/21 NOV none

## 2021-06-29 MED ORDER — OXYCODONE-ACETAMINOPHEN 5-325 MG PO TABS: 1.0000 | ORAL_TABLET | ORAL | 0 refills | Status: DC | PRN

## 2021-07-18 ENCOUNTER — Encounter: Payer: Self-pay | Admitting: Family Medicine

## 2021-07-18 ENCOUNTER — Other Ambulatory Visit: Payer: Self-pay

## 2021-07-18 DIAGNOSIS — F411 Generalized anxiety disorder: Secondary | ICD-10-CM

## 2021-07-18 DIAGNOSIS — M62838 Other muscle spasm: Secondary | ICD-10-CM

## 2021-07-18 MED ORDER — ALPRAZOLAM 0.25 MG PO TABS: 0.2500 mg | ORAL_TABLET | Freq: Two times a day (BID) | ORAL | 0 refills | Status: DC | PRN

## 2021-07-18 MED ORDER — CYCLOBENZAPRINE HCL 10 MG PO TABS: 10.0000 mg | ORAL_TABLET | Freq: Three times a day (TID) | ORAL | 0 refills | Status: DC | PRN

## 2021-07-18 NOTE — Telephone Encounter (Signed)
Hello, I'm not sure who is covering for Dr. Beverely Low during her absence, but I am leaving to go out of town Friday and was hoping to refill my prescription for cyclobenzaprine and alprazolam before I left. 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 I tried to request a refill through MyChart, but the message that it couldn't be refilled at this time was displayed under both medications. I would appreciate your assistance with this matter. Both prescriptions are written as a 10 and 15 day supply. Thanks, JBA   Both meds filled 06/20/21 #30 with no refills LOV 05/31/21 NOV none

## 2021-07-23 ENCOUNTER — Other Ambulatory Visit: Payer: Self-pay

## 2021-07-23 MED ORDER — OXYCODONE-ACETAMINOPHEN 5-325 MG PO TABS: 1.0000 | ORAL_TABLET | ORAL | 0 refills | Status: DC | PRN

## 2021-07-23 NOTE — Telephone Encounter (Signed)
Hi Lynn Campos, I should have gone ahead and had my prescription for Percocet refilled as well, especially after traveling and with school beginning - it's always hard on my body. I couldn't request a refill online; can you please assist me with this prescription as well? Thanks for your help.   LFD 06/29/21 #30 with no refills LOV 05/31/21 NOV none

## 2021-08-16 ENCOUNTER — Other Ambulatory Visit: Payer: Self-pay

## 2021-08-16 ENCOUNTER — Encounter: Payer: Self-pay | Admitting: Family Medicine

## 2021-08-16 DIAGNOSIS — F411 Generalized anxiety disorder: Secondary | ICD-10-CM

## 2021-08-16 DIAGNOSIS — M62838 Other muscle spasm: Secondary | ICD-10-CM

## 2021-08-16 MED ORDER — OXYCODONE-ACETAMINOPHEN 5-325 MG PO TABS: 1.0000 | ORAL_TABLET | ORAL | 0 refills | Status: DC | PRN

## 2021-08-16 MED ORDER — CYCLOBENZAPRINE HCL 10 MG PO TABS: 10.0000 mg | ORAL_TABLET | Freq: Three times a day (TID) | ORAL | 0 refills | Status: DC | PRN

## 2021-08-16 MED ORDER — ALPRAZOLAM 0.25 MG PO TABS: 0.2500 mg | ORAL_TABLET | Freq: Two times a day (BID) | ORAL | 0 refills | Status: DC | PRN

## 2021-08-16 NOTE — Telephone Encounter (Signed)
Requesting:Xanax 0.25mg , Percocet5-325mg  Contract: UDS: Last Visit:05/31/21 Next Visit:n/a Last Refill:07/18/21 for Xanax, 07/23/21 for Percocet 30 tabs 0 refills for both Rx Flexeril has already been filled  Please Advise

## 2021-08-28 ENCOUNTER — Encounter: Payer: Self-pay | Admitting: Family Medicine

## 2021-08-28 ENCOUNTER — Telehealth (INDEPENDENT_AMBULATORY_CARE_PROVIDER_SITE_OTHER): Payer: BC Managed Care – PPO | Admitting: Family Medicine

## 2021-08-28 VITALS — Temp 97.7°F | Wt 185.0 lb

## 2021-08-28 DIAGNOSIS — H9201 Otalgia, right ear: Secondary | ICD-10-CM

## 2021-08-28 DIAGNOSIS — J349 Unspecified disorder of nose and nasal sinuses: Secondary | ICD-10-CM | POA: Diagnosis not present

## 2021-08-28 MED ORDER — BENZONATATE 100 MG PO CAPS: 100.0000 mg | ORAL_CAPSULE | Freq: Three times a day (TID) | ORAL | 0 refills | Status: DC | PRN

## 2021-08-28 MED ORDER — AMOXICILLIN-POT CLAVULANATE 875-125 MG PO TABS: 1.0000 | ORAL_TABLET | Freq: Two times a day (BID) | ORAL | 0 refills | Status: DC

## 2021-08-28 NOTE — Progress Notes (Signed)
Virtual Visit via Video Note  I connected with Lynn Campos  on 08/28/21 at 12:40 PM EDT by a video enabled telemedicine application and verified that I am speaking with the correct person using two identifiers.  Location patient: home, [place] Location provider:work or home office Persons participating in the virtual visit: patient, provider  I discussed the limitations of evaluation and management by telemedicine and the availability of in person appointments. The patient expressed understanding and agreed to proceed.   HPI:  Acute telemedicine visit for sinus issues: -Onset: 3-4 days ago; but had some nasal congestion for 1-2 weeks prior -she did several covid tests which were negative in the last 4 days -Symptoms include: nasal congestion, cough, pnd, ear discomfort R worsening, sinus discomfort maxillary, emesis once yesterday she felt was from drainage in the throat -Denies:fevers, CP, SOB, diarrhea -Has tried: zyrtec, tylenol cold and sinus, benadryl -Pertinent past medical history: see below -Pertinent medication allergies: Allergies  Allergen Reactions   Flovent [Fluticasone Propionate]     Difficulty breathing. No issues with Qvar Rxed by Dr Shelle Iron   Codeine     REACTION: vomiting   Fluticasone Propionate (Inhal)    Mushroom Extract Complex Swelling    Pt reports hand swelling when consuming mushrooms.    Other     Bee stings and pecan nuts   -COVID-19 vaccine status: has had 2 doses and one booster and had covid once  ROS: See pertinent positives and negatives per HPI.  Past Medical History:  Diagnosis Date   Allergy    Anemia    in the past   Anxiety    Arthritis    neck   Asthma    Dyspnea    GERD (gastroesophageal reflux disease)    Heart murmur    Lupus (HCC)    Mitral regurgitation    Palpitations    Rosacea    Shingles     Past Surgical History:  Procedure Laterality Date   APPENDECTOMY     CERVICAL SPINE SURGERY  01/2010   CHOLECYSTECTOMY  05/2010    ROTATOR CUFF REPAIR     left   SIGMOIDOSCOPY       Current Outpatient Medications:    albuterol (PROVENTIL HFA) 108 (90 Base) MCG/ACT inhaler, Inhale 2 puffs into the lungs every 4 (four) hours as needed for wheezing., Disp: 1 each, Rfl: 12   ALPRAZolam (XANAX) 0.25 MG tablet, Take 1 tablet (0.25 mg total) by mouth 2 (two) times daily as needed for anxiety., Disp: 30 tablet, Rfl: 0   amoxicillin-clavulanate (AUGMENTIN) 875-125 MG tablet, Take 1 tablet by mouth 2 (two) times daily., Disp: 20 tablet, Rfl: 0   benzonatate (TESSALON PERLES) 100 MG capsule, Take 1 capsule (100 mg total) by mouth 3 (three) times daily as needed., Disp: 20 capsule, Rfl: 0   cyclobenzaprine (FLEXERIL) 10 MG tablet, Take 1 tablet (10 mg total) by mouth 3 (three) times daily as needed for muscle spasms., Disp: 30 tablet, Rfl: 0   EPINEPHrine 0.3 mg/0.3 mL IJ SOAJ injection, Inject 0.3 mLs (0.3 mg total) into the muscle once., Disp: 1 Device, Rfl: 2   ondansetron (ZOFRAN) 4 MG tablet, Take 1 tablet (4 mg total) by mouth every 8 (eight) hours as needed for nausea or vomiting., Disp: 30 tablet, Rfl: 0   oxyCODONE-acetaminophen (PERCOCET/ROXICET) 5-325 MG tablet, Take 1 tablet by mouth every 4 (four) hours as needed. Last refill w/o appt.  Please call to schedule, Disp: 30 tablet, Rfl: 0   vitamin  C (ASCORBIC ACID) 500 MG tablet, Take 500 mg by mouth 2 (two) times daily., Disp: , Rfl:   EXAM:  VITALS per patient if applicable:  GENERAL: alert, oriented, appears well and in no acute distress  HEENT: atraumatic, conjunttiva clear, no obvious abnormalities on inspection of external nose and ears  NECK: normal movements of the head and neck  LUNGS: on inspection no signs of respiratory distress, breathing rate appears normal, no obvious gross SOB, gasping or wheezing  CV: no obvious cyanosis  MS: moves all visible extremities without noticeable abnormality  PSYCH/NEURO: pleasant and cooperative, no obvious depression  or anxiety, speech and thought processing grossly intact  ASSESSMENT AND PLAN:  Discussed the following assessment and plan:  Right ear pain  Sinus disorder  -we discussed possible serious and likely etiologies, options for evaluation and workup, limitations of telemedicine visit vs in person visit, treatment, treatment risks and precautions. Pt is agreeable to treatment via telemedicine at this moment. Query VURI, covid with false neg testing, influenza, possible developing sinusitis and or OM vs other. She has opted to try nasal saline and a short course of nasal decongestant with Tessalon for cough and Augmentin if worsening or not improving.  Work/School slipped offered: declined Advised to seek prompt in person care if worsening, new symptoms arise, or if is not improving with treatment. Discussed options for inperson care if PCP office not available. Did let this patient know that I only do telemedicine on Tuesdays and Thursdays for [place]. Advised to schedule follow up visit with PCP or [facility] if any further questions or concerns to avoid delays in care.   I discussed the assessment and treatment plan with the patient. The patient was provided an opportunity to ask questions and all were answered. The patient agreed with the plan and demonstrated an understanding of the instructions.     Lynn Koyanagi, DO

## 2021-08-28 NOTE — Patient Instructions (Addendum)
-  I sent the medication(s) we discussed to your pharmacy: Meds ordered this encounter  Medications   amoxicillin-clavulanate (AUGMENTIN) 875-125 MG tablet    Sig: Take 1 tablet by mouth 2 (two) times daily.    Dispense:  20 tablet    Refill:  0   benzonatate (TESSALON PERLES) 100 MG capsule    Sig: Take 1 capsule (100 mg total) by mouth 3 (three) times daily as needed.    Dispense:  20 capsule    Refill:  0    Can try nasal saline twice daily and a short course of afrin for 3 days  I hope you are feeling better soon!  Seek in person care promptly if your symptoms worsen, new concerns arise or you are not improving with treatment.  It was nice to meet you today. I help [facility] out with telemedicine visits on Tuesdays and Thursdays and am available for visits on those days. If you have any concerns or questions following this visit please schedule a follow up visit with your Primary Care doctor or seek care at a local urgent care clinic to avoid delays in care.

## 2021-08-29 ENCOUNTER — Other Ambulatory Visit: Payer: Self-pay

## 2021-08-29 ENCOUNTER — Encounter: Payer: Self-pay | Admitting: Gastroenterology

## 2021-08-29 ENCOUNTER — Ambulatory Visit (AMBULATORY_SURGERY_CENTER): Payer: BC Managed Care – PPO

## 2021-08-29 VITALS — Ht 64.0 in | Wt 185.0 lb

## 2021-08-29 DIAGNOSIS — Z1211 Encounter for screening for malignant neoplasm of colon: Secondary | ICD-10-CM

## 2021-08-29 MED ORDER — PLENVU 140 G PO SOLR: 1.0000 | ORAL | 0 refills | Status: DC

## 2021-08-29 NOTE — Progress Notes (Signed)
Pre visit completed via phone call; Patient verified name, DOB, and address; No egg or soy allergy known to patient  No issues known to pt with past sedation with any surgeries or procedures Patient denies ever being told they had issues or difficulty with intubation  No FH of Malignant Hyperthermia Pt is not on diet pills Pt is not on home 02  Pt is not on blood thinners  Pt denies issues with constipation at this time; No A fib or A flutter Pt is fully vaccinated for Covid x 2; Coupon given to pt in PV today, Code to Pharmacy and NO PA's for preps discussed with pt in PV today  Discussed with pt there will be an out-of-pocket cost for prep and that varies from $0 to 70 + dollars - pt verbalized understanding  Due to the COVID-19 pandemic we are asking patients to follow certain guidelines in [facility] and the [facility]   Pt aware of COVID protocols and [facility] guidelines   

## 2021-09-17 ENCOUNTER — Other Ambulatory Visit: Payer: Self-pay | Admitting: Family Medicine

## 2021-09-17 DIAGNOSIS — M62838 Other muscle spasm: Secondary | ICD-10-CM

## 2021-09-17 DIAGNOSIS — F411 Generalized anxiety disorder: Secondary | ICD-10-CM

## 2021-09-18 MED ORDER — ALPRAZOLAM 0.25 MG PO TABS: 0.2500 mg | ORAL_TABLET | Freq: Two times a day (BID) | ORAL | 0 refills | Status: DC | PRN

## 2021-09-18 MED ORDER — OXYCODONE-ACETAMINOPHEN 5-325 MG PO TABS: 1.0000 | ORAL_TABLET | ORAL | 0 refills | Status: DC | PRN

## 2021-09-18 MED ORDER — CYCLOBENZAPRINE HCL 10 MG PO TABS: 10.0000 mg | ORAL_TABLET | Freq: Three times a day (TID) | ORAL | 0 refills | Status: DC | PRN

## 2021-09-18 NOTE — Telephone Encounter (Signed)
UTD on visits, please escribe 

## 2021-09-20 ENCOUNTER — Telehealth: Payer: Self-pay | Admitting: Gastroenterology

## 2021-09-20 NOTE — Telephone Encounter (Signed)
Pt stated she had one mini utz cheeseball this afternoon. Advised patient that we could still do her procedure tomorrow, and to remain on clear liquids for the rest of the day. Pt verbalized understanding and had no further concerns at the end of the call.

## 2021-09-20 NOTE — Telephone Encounter (Signed)
Inbound call from pt requesting a call back stating that she had a small cheese ball and wanted to know if she can still have her procedure tomorrow. Please advise. Thank you.

## 2021-09-21 ENCOUNTER — Other Ambulatory Visit: Payer: Self-pay

## 2021-09-21 ENCOUNTER — Encounter: Payer: Self-pay | Admitting: Gastroenterology

## 2021-09-21 ENCOUNTER — Ambulatory Visit (AMBULATORY_SURGERY_CENTER): Payer: BC Managed Care – PPO | Admitting: Gastroenterology

## 2021-09-21 VITALS — BP 136/82 | HR 80 | Temp 98.0°F | Resp 16 | Ht 64.0 in | Wt 185.0 lb

## 2021-09-21 DIAGNOSIS — Z1211 Encounter for screening for malignant neoplasm of colon: Secondary | ICD-10-CM | POA: Diagnosis present

## 2021-09-21 MED ORDER — SODIUM CHLORIDE 0.9 % IV SOLN: 500.0000 mL | Freq: Once | INTRAVENOUS | Status: DC

## 2021-09-21 NOTE — Op Note (Signed)
[facility] Patient Name: Lynn Campos Procedure Date: 09/21/2021 9:03 AM MRN: [id number] Endoscopist: Sherilyn Cooter L. Myrtie Neither , MD Age: 50 Referring MD:  Date of Birth: 04-Nov-1971 Gender: Female Account #: [id number] Procedure:                Colonoscopy Indications:              Screening for colorectal malignant neoplasm, This                            is the patient's first colonoscopy Medicines:                Monitored Anesthesia Care Procedure:                Pre-Anesthesia Assessment:                           - Prior to the procedure, a History and Physical                            was performed, and patient medications and                            allergies were reviewed. The patient's tolerance of                            previous anesthesia was also reviewed. The risks                            and benefits of the procedure and the sedation                            options and risks were discussed with the patient.                            All questions were answered, and informed consent                            was obtained. Prior Anticoagulants: The patient has                            taken no previous anticoagulant or antiplatelet                            agents. ASA Grade Assessment: II - A patient with                            mild systemic disease. After reviewing the risks                            and benefits, the patient was deemed in                            satisfactory condition to undergo the procedure.  After obtaining informed consent, the colonoscope                            was passed under direct vision. Throughout the                            procedure, the patient's blood pressure, pulse, and                            oxygen saturations were monitored continuously. The                            Olympus CF-HQ190L ([id number]) Colonoscope was                            introduced through the anus and  advanced to the the                            cecum, identified by appendiceal orifice and                            ileocecal valve. The colonoscopy was performed                            without difficulty. The patient tolerated the                            procedure well. The quality of the bowel                            preparation was excellent. The ileocecal valve,                            appendiceal orifice, and rectum were photographed.                            The bowel preparation used was Plenvu. Scope In: 9:11:20 AM Scope Out: 9:25:28 AM Scope Withdrawal Time: 0 hours 11 minutes 38 seconds  Total Procedure Duration: 0 hours 14 minutes 8 seconds  Findings:                 The perianal and digital rectal examinations were                            normal.                           The entire examined colon appeared normal on direct                            and retroflexion views. Complications:            No immediate complications. Estimated Blood Loss:     Estimated blood loss: none. Impression:               - The entire examined colon is normal  on direct and                            retroflexion views.                           - No specimens collected. Recommendation:           - Patient has a contact number available for                            emergencies. The signs and symptoms of potential                            delayed complications were discussed with the                            patient. Return to normal activities tomorrow.                            Written discharge instructions were provided to the                            patient.                           - Resume previous diet.                           - Continue present medications.                           - Repeat colonoscopy in 10 years for screening                            purposes. Anyssa Sharpless L. Myrtie Neither, MD 09/21/2021 9:27:39 AM This report has been signed  electronically.

## 2021-09-21 NOTE — Patient Instructions (Signed)
YOU HAD AN ENDOSCOPIC PROCEDURE TODAY AT THE [facility]:   Refer to the procedure report that was given to you for any specific questions about what was found during the examination.  If the procedure report does not answer your questions, please call your gastroenterologist to clarify.  If you requested that your care partner not be given the details of your procedure findings, then the procedure report has been included in a sealed envelope for you to review at your convenience later.  YOU SHOULD EXPECT: Some feelings of bloating in the abdomen. Passage of more gas than usual.  Walking can help get rid of the air that was put into your GI tract during the procedure and reduce the bloating. If you had a lower endoscopy (such as a colonoscopy or flexible sigmoidoscopy) you may notice spotting of blood in your stool or on the toilet paper. If you underwent a bowel prep for your procedure, you may not have a normal bowel movement for a few days.  Please Note:  You might notice some irritation and congestion in your nose or some drainage.  This is from the oxygen used during your procedure.  There is no need for concern and it should clear up in a day or so.  SYMPTOMS TO REPORT IMMEDIATELY:  Following lower endoscopy (colonoscopy or flexible sigmoidoscopy):  Excessive amounts of blood in the stool  Significant tenderness or worsening of abdominal pains  Swelling of the abdomen that is new, acute  Fever of 100F or higher   For urgent or emergent issues, a gastroenterologist can be reached at any hour by calling [phone]. Do not use MyChart messaging for urgent concerns.    DIET:  We do recommend a small meal at first, but then you may proceed to your regular diet.  Drink plenty of fluids but you should avoid alcoholic beverages for 24 hours.  MEDICATIONS: Continue present medications.  ACTIVITY:  You should plan to take it easy for the rest of today and you should NOT DRIVE or  use heavy machinery until tomorrow (because of the sedation medicines used during the test).    FOLLOW UP: Our staff will call the number listed on your records 48-72 hours following your procedure to check on you and address any questions or concerns that you may have regarding the information given to you following your procedure. If we do not reach you, we will leave a message.  We will attempt to reach you two times.  During this call, we will ask if you have developed any symptoms of COVID 19. If you develop any symptoms (ie: fever, flu-like symptoms, shortness of breath, cough etc.) before then, please call [phone].  If you test positive for Covid 19 in the 2 weeks post procedure, please call and report this information to [place].    If any biopsies were taken you will be contacted by phone or by letter within the next 1-3 weeks.  Please call us at [phone] if you have not heard about the biopsies in 3 weeks.    SIGNATURES/CONFIDENTIALITY: You and/or your care partner have signed paperwork which will be entered into your electronic medical record.  These signatures attest to the fact that that the information above on your After Visit Summary has been reviewed and is understood.  Full responsibility of the confidentiality of this discharge information lies with you and/or your care-partner.

## 2021-09-21 NOTE — Progress Notes (Signed)
VS completed by DT.  Pt's states no medical or surgical changes since previsit or office visit.  

## 2021-09-21 NOTE — Progress Notes (Signed)
History and Physical:  This patient presents for endoscopic testing for: Encounter Diagnosis  Name Primary?   Colon cancer screening Yes    No fam Hx CRC No rectal bleeding or altered BMs  ROS: Patient denies chest pain or cough   Past Medical History: Past Medical History:  Diagnosis Date   Anemia    in the past   Anxiety    Arthritis    neck   Asthma    uses inhaler PRN   Dyspnea    GERD (gastroesophageal reflux disease)    Heart murmur    Hyperlipidemia    diet controlled   Lupus (HCC)    Mitral regurgitation    Palpitations    Rosacea    Seasonal allergies    Shingles    Tuberculosis 2012   medication x 1 month for treatment     Past Surgical History: Past Surgical History:  Procedure Laterality Date   APPENDECTOMY     CERVICAL ABLATION  2022   with cerivcal cyst removed   CERVICAL SPINE SURGERY  01/2010   with plate placed   CHOLECYSTECTOMY  05/2010   ROTATOR CUFF REPAIR Left    SIGMOIDOSCOPY  2016   Dr. Juanda Chance    Allergies: Allergies  Allergen Reactions   Flovent [Fluticasone Propionate]     Difficulty breathing. No issues with Qvar Rxed by Dr Shelle Iron   Codeine     REACTION: vomiting   Fluticasone Furoate    Mushroom Extract Complex Swelling    Pt reports hand swelling when consuming mushrooms.    Other     Bee stings and pecan nuts     Outpatient Meds: Current Outpatient Medications  Medication Sig Dispense Refill   cyclobenzaprine (FLEXERIL) 10 MG tablet Take 1 tablet (10 mg total) by mouth 3 (three) times daily as needed for muscle spasms. 30 tablet 0   albuterol (PROVENTIL HFA) 108 (90 Base) MCG/ACT inhaler Inhale 2 puffs into the lungs every 4 (four) hours as needed for wheezing. 1 each 12   ALPRAZolam (XANAX) 0.25 MG tablet Take 1 tablet (0.25 mg total) by mouth 2 (two) times daily as needed for anxiety. 30 tablet 0   amoxicillin-clavulanate (AUGMENTIN) 875-125 MG tablet Take 1 tablet by mouth 2 (two) times daily. (Patient not  taking: Reported on 09/21/2021) 20 tablet 0   benzonatate (TESSALON PERLES) 100 MG capsule Take 1 capsule (100 mg total) by mouth 3 (three) times daily as needed. (Patient not taking: No sig reported) 20 capsule 0   EPINEPHrine 0.3 mg/0.3 mL IJ SOAJ injection Inject 0.3 mLs (0.3 mg total) into the muscle once. 1 Device 2   ondansetron (ZOFRAN) 4 MG tablet Take 1 tablet (4 mg total) by mouth every 8 (eight) hours as needed for nausea or vomiting. 30 tablet 0   oxyCODONE-acetaminophen (PERCOCET/ROXICET) 5-325 MG tablet Take 1 tablet by mouth every 4 (four) hours as needed. 30 tablet 0   vitamin C (ASCORBIC ACID) 500 MG tablet Take 500 mg by mouth 2 (two) times daily.     Current Facility-Administered Medications  Medication Dose Route Frequency Provider Last Rate Last Admin   0.9 %  sodium chloride infusion  500 mL Intravenous Once Charlie Pitter III, MD          ___________________________________________________________________ Objective   Exam:  BP (!) 151/94   Pulse 88   Temp 98 F (36.7 C) (Temporal)   Ht 5\' 4"  (1.626 m)   Wt 185 lb (83.9 kg)  SpO2 99%   BMI 31.76 kg/m   CV: RRR without murmur, S1/S2 Resp: clear to auscultation bilaterally, normal RR and effort noted GI: soft, no tenderness, with active bowel sounds.   Assessment: Encounter Diagnosis  Name Primary?   Colon cancer screening Yes     Plan: Colonoscopy  The benefits and risks of the planned procedure were described in detail with the patient or (when appropriate) their health care proxy.  Risks were outlined as including, but not limited to, bleeding, infection, perforation, adverse medication reaction leading to cardiac or pulmonary decompensation, pancreatitis (if ERCP).  The limitation of incomplete mucosal visualization was also discussed.  No guarantees or warranties were given.    The patient is appropriate for an endoscopic procedure in the ambulatory setting.   - Amada Jupiter, MD

## 2021-09-21 NOTE — Progress Notes (Signed)
Patient c/o nausea upon arrival to recovery, CRNA states he has already administered Zofran 4 mg IV, no emesis in recovery, patient denies abd pain, abd distended, encouraged patient to pass air, adm Levsin 0.25 mg SQ, repositioned patient, once able to removed patient from monitor, assisted patient up to BR where she was able to pass air, states nausea is resolved and that she feels "so much better".

## 2021-09-21 NOTE — Progress Notes (Signed)
A and O x3. Report to RN. Tolerated MAC anesthesia well. 

## 2021-09-25 ENCOUNTER — Telehealth: Payer: Self-pay

## 2021-09-25 NOTE — Telephone Encounter (Signed)
  Follow up Call-  Call back number 09/21/2021  Post procedure Call Back phone  # [phone]  Permission to leave phone message Yes  Some recent data might be hidden     Patient questions:  Do you have a fever, pain , or abdominal swelling? No. Pain Score  0 *  Have you tolerated food without any problems? Yes.    Have you been able to return to your normal activities? Yes.    Do you have any questions about your discharge instructions: Diet   No. Medications  No. Follow up visit  No.  Do you have questions or concerns about your Care? Yes.  Pt. Reports that every time she eats, she feels she needs to go to the bathroom, and does have a bowel movement.  Pt. Denies any pain, significant bleeding (other than slight following the procedure as pt. Was told she might) fever.  So different from how she was prior to the procedure.  Told pt. I did not feel there was any reason for concern, just that she is noticing a difference in frequency.  Told pt. To call back later in the week, or in the future if she feels concerns persist.  Actions: * If pain score is 4 or above: No action needed, pain <4.

## 2021-10-10 ENCOUNTER — Other Ambulatory Visit: Payer: Self-pay | Admitting: Family Medicine

## 2021-10-10 DIAGNOSIS — M62838 Other muscle spasm: Secondary | ICD-10-CM

## 2021-10-10 DIAGNOSIS — F411 Generalized anxiety disorder: Secondary | ICD-10-CM

## 2021-10-11 MED ORDER — ALPRAZOLAM 0.25 MG PO TABS: 0.2500 mg | ORAL_TABLET | Freq: Two times a day (BID) | ORAL | 0 refills | Status: DC | PRN

## 2021-10-11 MED ORDER — OXYCODONE-ACETAMINOPHEN 5-325 MG PO TABS: 1.0000 | ORAL_TABLET | ORAL | 0 refills | Status: DC | PRN

## 2021-10-11 MED ORDER — CYCLOBENZAPRINE HCL 10 MG PO TABS: 10.0000 mg | ORAL_TABLET | Freq: Three times a day (TID) | ORAL | 0 refills | Status: DC | PRN

## 2021-10-11 NOTE — Telephone Encounter (Signed)
Patient is requesting a refill of the following medications: Requested Prescriptions   Pending Prescriptions Disp Refills   cyclobenzaprine (FLEXERIL) 10 MG tablet 30 tablet 0    Sig: Take 1 tablet (10 mg total) by mouth 3 (three) times daily as needed for muscle spasms.   ALPRAZolam (XANAX) 0.25 MG tablet 30 tablet 0    Sig: Take 1 tablet (0.25 mg total) by mouth 2 (two) times daily as needed for anxiety.   oxyCODONE-acetaminophen (PERCOCET/ROXICET) 5-325 MG tablet 30 tablet 0    Sig: Take 1 tablet by mouth every 4 (four) hours as needed.    Date of patient request: 10/10/2021 Last office visit: 08/28/2021 Date of last refill: 09/18/2021 Last refill amount: 30 tablets  Follow up time period per chart: none

## 2021-10-11 NOTE — Telephone Encounter (Signed)
Defer to Dr. Beverely Low - I haven't seen this pt  Thanks,  Luan Pulling

## 2021-11-05 ENCOUNTER — Other Ambulatory Visit: Payer: Self-pay | Admitting: Family Medicine

## 2021-11-05 DIAGNOSIS — F411 Generalized anxiety disorder: Secondary | ICD-10-CM

## 2021-11-05 DIAGNOSIS — M62838 Other muscle spasm: Secondary | ICD-10-CM

## 2021-11-05 MED ORDER — CYCLOBENZAPRINE HCL 10 MG PO TABS: 10.0000 mg | ORAL_TABLET | Freq: Three times a day (TID) | ORAL | 0 refills | Status: DC | PRN

## 2021-11-05 MED ORDER — ALPRAZOLAM 0.25 MG PO TABS: 0.2500 mg | ORAL_TABLET | Freq: Two times a day (BID) | ORAL | 0 refills | Status: DC | PRN

## 2021-11-05 MED ORDER — OXYCODONE-ACETAMINOPHEN 5-325 MG PO TABS: 1.0000 | ORAL_TABLET | ORAL | 0 refills | Status: DC | PRN

## 2021-11-23 ENCOUNTER — Encounter: Payer: Self-pay | Admitting: Family

## 2021-11-23 ENCOUNTER — Ambulatory Visit: Payer: BC Managed Care – PPO | Admitting: Family

## 2021-11-23 ENCOUNTER — Other Ambulatory Visit: Payer: Self-pay | Admitting: Family Medicine

## 2021-11-23 ENCOUNTER — Other Ambulatory Visit: Payer: Self-pay

## 2021-11-23 VITALS — BP 136/88 | HR 84 | Temp 97.1°F | Ht 64.0 in | Wt 194.2 lb

## 2021-11-23 DIAGNOSIS — M62838 Other muscle spasm: Secondary | ICD-10-CM

## 2021-11-23 DIAGNOSIS — L02416 Cutaneous abscess of left lower limb: Secondary | ICD-10-CM

## 2021-11-23 MED ORDER — CYCLOBENZAPRINE HCL 10 MG PO TABS: 10.0000 mg | ORAL_TABLET | Freq: Three times a day (TID) | ORAL | 0 refills | Status: DC | PRN

## 2021-11-23 MED ORDER — OXYCODONE-ACETAMINOPHEN 5-325 MG PO TABS
1.0000 | ORAL_TABLET | ORAL | 0 refills | Status: DC | PRN
Start: 2021-11-23 — End: 2021-12-13

## 2021-11-23 NOTE — Telephone Encounter (Signed)
Patient is requesting a refill of the following medications: Requested Prescriptions   Pending Prescriptions Disp Refills   cyclobenzaprine (FLEXERIL) 10 MG tablet 30 tablet 0    Sig: Take 1 tablet (10 mg total) by mouth 3 (three) times daily as needed for muscle spasms.   oxyCODONE-acetaminophen (PERCOCET/ROXICET) 5-325 MG tablet 30 tablet 0    Sig: Take 1 tablet by mouth every 4 (four) hours as needed.    Date of patient request: 11/23/21 Last office visit: 11/23/21 Date of last refill: 11/05/21 Last refill amount: 30

## 2021-11-26 NOTE — Progress Notes (Signed)
Acute Office Visit  Subjective:    Patient ID: Lynn Campos, female    DOB: [birth year], 51 y.o.   MRN: [id number]  Chief Complaint  Patient presents with   Acute Visit    Infected place on left leg x 3 weeks. Painful and tender to the touch, pt has tried to shave it down, pull out any hairs, and used pimple patches with no relief.     HPI Patient is in today with concerns of a red, tender, erythematous lesion on the left lower leg that has been presents x 3 weeks. Patient reports she has been picking at it and it is now sensitive to touch and when her pants rub it. She initially thought she had an ingrown hair but has not been able to retrieve a hair from the lesion.    Past Medical History:  Diagnosis Date   Anemia    in the past   Anxiety    Arthritis    neck   Asthma    uses inhaler PRN   Dyspnea    GERD (gastroesophageal reflux disease)    Heart murmur    Hyperlipidemia    diet controlled   Lupus (HCC)    Mitral regurgitation    Palpitations    Rosacea    Seasonal allergies    Shingles    Tuberculosis 2012   medication x 1 month for treatment    Past Surgical History:  Procedure Laterality Date   APPENDECTOMY     CERVICAL ABLATION  2022   with cerivcal cyst removed   CERVICAL SPINE SURGERY  01/2010   with plate placed   CHOLECYSTECTOMY  05/2010   ROTATOR CUFF REPAIR Left    SIGMOIDOSCOPY  2016   Dr. Olevia Perches    Family History  Problem Relation Age of Onset   Coronary artery disease Mother    Hypertension Mother    Stroke Mother    Rheum arthritis Mother    Coronary artery disease Father    Allergies Sister    Asthma Sister    Rheum arthritis Maternal Grandmother    Colon cancer Neg Hx    Esophageal cancer Neg Hx    Rectal cancer Neg Hx    Stomach cancer Neg Hx    Colon polyps Neg Hx     Social History   Socioeconomic History   Marital status: Married    Spouse name: Not on file   Number of children: 2    Years of education: Not on file   Highest education level: Not on file  Occupational History   Occupation:      Employer: Automotive engineer [employer]   Occupation: Pharmacist, hospital-- [employer]    Employer: [employer]  Tobacco Use   Smoking status: Never   Smokeless tobacco: Never  Vaping Use   Vaping Use: Never used  Substance and Sexual Activity   Alcohol use: Yes    Comment: special occasions   Drug use: No   Sexual activity: Yes    Comment: 1st intercourse 51 yo-Fewer than 5 partners-Vasectomy  Other Topics Concern   Not on file  Social History Narrative   Not on file   Social Determinants of Health   Financial Resource Strain: Not on file  Food Insecurity: Not on file  Transportation Needs: Not on file  Physical Activity: Not on file  Stress: Not on file  Social Connections: Not on file  Intimate Partner Violence: Not on file    Outpatient  Medications Prior to Visit  Medication Sig Dispense Refill   albuterol (PROVENTIL HFA) 108 (90 Base) MCG/ACT inhaler Inhale 2 puffs into the lungs every 4 (four) hours as needed for wheezing. 1 each 12   ALPRAZolam (XANAX) 0.25 MG tablet Take 1 tablet (0.25 mg total) by mouth 2 (two) times daily as needed for anxiety. 30 tablet 0   EPINEPHrine 0.3 mg/0.3 mL IJ SOAJ injection Inject 0.3 mLs (0.3 mg total) into the muscle once. 1 Device 2   ondansetron (ZOFRAN) 4 MG tablet Take 1 tablet (4 mg total) by mouth every 8 (eight) hours as needed for nausea or vomiting. 30 tablet 0   vitamin C (ASCORBIC ACID) 500 MG tablet Take 500 mg by mouth 2 (two) times daily.     cyclobenzaprine (FLEXERIL) 10 MG tablet Take 1 tablet (10 mg total) by mouth 3 (three) times daily as needed for muscle spasms. 30 tablet 0   oxyCODONE-acetaminophen (PERCOCET/ROXICET) 5-325 MG tablet Take 1 tablet by mouth every 4 (four) hours as needed. 30 tablet 0   amoxicillin-clavulanate (AUGMENTIN) 875-125 MG tablet Take 1 tablet by mouth  2 (two) times daily. (Patient not taking: Reported on 09/21/2021) 20 tablet 0   benzonatate (TESSALON PERLES) 100 MG capsule Take 1 capsule (100 mg total) by mouth 3 (three) times daily as needed. (Patient not taking: Reported on 08/29/2021) 20 capsule 0   Facility-Administered Medications Prior to Visit  Medication Dose Route Frequency Provider Last Rate Last Admin   0.9 %  sodium chloride infusion  500 mL Intravenous Once Danis, Kirke Corin, MD        Allergies  Allergen Reactions   Flovent [Fluticasone Propionate]     Difficulty breathing. No issues with Qvar Rxed by Dr Gwenette Greet   Codeine     REACTION: vomiting   Fluticasone Furoate    Mushroom Extract Complex Swelling    Pt reports hand swelling when consuming mushrooms.    Other     Bee stings and pecan nuts     Review of Systems  Constitutional: Negative.   Respiratory: Negative.    Cardiovascular: Negative.   Musculoskeletal: Negative.   Skin:  Positive for wound.       Red, tender lesion to the left lower leg.   Psychiatric/Behavioral: Negative.    All other systems reviewed and are negative.     Objective:    Physical Exam Vitals and nursing note reviewed.  Constitutional:      Appearance: Normal appearance.  Cardiovascular:     Rate and Rhythm: Normal rate.  Pulmonary:     Effort: Pulmonary effort is normal.     Breath sounds: Normal breath sounds.  Skin:    General: Skin is warm.          Comments: Red, tender, papular lesion noted to the left lower leg.  Incision and drainage: Under sterile technique, 3 ccs of Lidocaine used to anesthetize the lesion. .5cm incision made to expel a large amount of thick white discharge expelled. Patient tolerated the procedure well. Steri-strips applied    Neurological:     Mental Status: She is alert.   BP 136/88 (BP Location: Left Arm, Cuff Size: Large)    Pulse 84    Temp (!) 97.1 F (36.2 C) (Temporal)    Ht 5\' 4"  (1.626 m)    Wt 194 lb 3.2 oz (88.1 kg)    SpO2  99%    BMI 33.33 kg/m  Wt Readings from Last 3 Encounters:  11/23/21 194 lb 3.2 oz (88.1 kg)  09/21/21 185 lb (83.9 kg)  08/29/21 185 lb (83.9 kg)    Health Maintenance Due  Topic Date Due   Pneumococcal Vaccine 65-12 Years old (1 - PCV) Never done   Zoster Vaccines- Shingrix (1 of 2) Never done   COVID-19 Vaccine (4 - Booster for Pfizer series) 09/22/2020   INFLUENZA VACCINE  06/25/2021    There are no preventive care reminders to display for this patient.   Lab Results  Component Value Date   TSH 1.43 05/31/2021   Lab Results  Component Value Date   WBC 4.3 05/31/2021   HGB 13.4 05/31/2021   HCT 40.6 05/31/2021   MCV 88.6 05/31/2021   PLT 227.0 05/31/2021   Lab Results  Component Value Date   NA 138 05/31/2021   K 4.1 05/31/2021   CO2 25 05/31/2021   GLUCOSE 91 05/31/2021   BUN 9 05/31/2021   CREATININE 0.69 05/31/2021   BILITOT 0.3 05/31/2021   ALKPHOS 51 05/31/2021   AST 16 05/31/2021   ALT 19 05/31/2021   PROT 6.7 05/31/2021   ALBUMIN 4.3 05/31/2021   CALCIUM 8.9 05/31/2021   ANIONGAP 8 10/24/2019   GFR 99.88 05/31/2021   Lab Results  Component Value Date   CHOL 220 (H) 05/31/2021   Lab Results  Component Value Date   HDL 63.10 05/31/2021   Lab Results  Component Value Date   LDLCALC 144 (H) 05/31/2021   Lab Results  Component Value Date   TRIG 66.0 05/31/2021   Lab Results  Component Value Date   CHOLHDL 3 05/31/2021   Lab Results  Component Value Date   HGBA1C 5.7 09/22/2013       Assessment & Plan:   Problem List Items Addressed This Visit   None Visit Diagnoses     Abscess of left lower leg    -  Primary        Plan: Call the office with any questions or concerns. Recheck as scheduled and sooner as needed.      Kennyth Arnold, FNP

## 2021-12-04 ENCOUNTER — Other Ambulatory Visit: Payer: Self-pay | Admitting: Family Medicine

## 2021-12-04 DIAGNOSIS — F411 Generalized anxiety disorder: Secondary | ICD-10-CM

## 2021-12-05 MED ORDER — ALPRAZOLAM 0.25 MG PO TABS: 0.2500 mg | ORAL_TABLET | Freq: Two times a day (BID) | ORAL | 0 refills | Status: DC | PRN

## 2021-12-13 ENCOUNTER — Other Ambulatory Visit: Payer: Self-pay | Admitting: Family Medicine

## 2021-12-13 DIAGNOSIS — M62838 Other muscle spasm: Secondary | ICD-10-CM

## 2021-12-13 NOTE — Telephone Encounter (Signed)
Patient is requesting a refill of the following medications: Requested Prescriptions   Pending Prescriptions Disp Refills   cyclobenzaprine (FLEXERIL) 10 MG tablet 30 tablet 0    Sig: Take 1 tablet (10 mg total) by mouth 3 (three) times daily as needed for muscle spasms.   oxyCODONE-acetaminophen (PERCOCET/ROXICET) 5-325 MG tablet 30 tablet 0    Sig: Take 1 tablet by mouth every 4 (four) hours as needed.    Date of patient request: 12/13/21 Last office visit: 11/23/21 Date of last refill: 11/23/21 Last refill amount: 30

## 2021-12-14 MED ORDER — CYCLOBENZAPRINE HCL 10 MG PO TABS: 10.0000 mg | ORAL_TABLET | Freq: Three times a day (TID) | ORAL | 0 refills | Status: DC | PRN

## 2021-12-14 MED ORDER — OXYCODONE-ACETAMINOPHEN 5-325 MG PO TABS: 1.0000 | ORAL_TABLET | ORAL | 0 refills | Status: DC | PRN

## 2022-01-01 ENCOUNTER — Other Ambulatory Visit: Payer: Self-pay | Admitting: Family Medicine

## 2022-01-01 DIAGNOSIS — M62838 Other muscle spasm: Secondary | ICD-10-CM

## 2022-01-01 DIAGNOSIS — F411 Generalized anxiety disorder: Secondary | ICD-10-CM

## 2022-01-02 MED ORDER — ALPRAZOLAM 0.25 MG PO TABS: 0.2500 mg | ORAL_TABLET | Freq: Two times a day (BID) | ORAL | 0 refills | Status: DC | PRN

## 2022-01-02 MED ORDER — CYCLOBENZAPRINE HCL 10 MG PO TABS: 10.0000 mg | ORAL_TABLET | Freq: Three times a day (TID) | ORAL | 0 refills | Status: DC | PRN

## 2022-01-03 ENCOUNTER — Encounter: Payer: Self-pay | Admitting: Family Medicine

## 2022-01-08 ENCOUNTER — Other Ambulatory Visit: Payer: Self-pay | Admitting: Family Medicine

## 2022-01-09 MED ORDER — OXYCODONE-ACETAMINOPHEN 5-325 MG PO TABS: 1.0000 | ORAL_TABLET | ORAL | 0 refills | Status: DC | PRN

## 2022-01-31 ENCOUNTER — Other Ambulatory Visit: Payer: Self-pay | Admitting: Family Medicine

## 2022-01-31 DIAGNOSIS — M62838 Other muscle spasm: Secondary | ICD-10-CM

## 2022-01-31 DIAGNOSIS — F411 Generalized anxiety disorder: Secondary | ICD-10-CM

## 2022-01-31 MED ORDER — ALPRAZOLAM 0.25 MG PO TABS: 0.2500 mg | ORAL_TABLET | Freq: Two times a day (BID) | ORAL | 0 refills | Status: DC | PRN

## 2022-01-31 MED ORDER — CYCLOBENZAPRINE HCL 10 MG PO TABS: 10.0000 mg | ORAL_TABLET | Freq: Three times a day (TID) | ORAL | 0 refills | Status: DC | PRN

## 2022-01-31 MED ORDER — OXYCODONE-ACETAMINOPHEN 5-325 MG PO TABS: 1.0000 | ORAL_TABLET | ORAL | 0 refills | Status: DC | PRN

## 2022-02-25 ENCOUNTER — Other Ambulatory Visit: Payer: Self-pay | Admitting: Family Medicine

## 2022-02-25 DIAGNOSIS — F411 Generalized anxiety disorder: Secondary | ICD-10-CM

## 2022-02-25 DIAGNOSIS — M62838 Other muscle spasm: Secondary | ICD-10-CM

## 2022-02-26 ENCOUNTER — Encounter: Payer: Self-pay | Admitting: Family Medicine

## 2022-02-26 ENCOUNTER — Other Ambulatory Visit: Payer: Self-pay | Admitting: Family Medicine

## 2022-02-26 MED ORDER — CYCLOBENZAPRINE HCL 10 MG PO TABS: 10.0000 mg | ORAL_TABLET | Freq: Three times a day (TID) | ORAL | 0 refills | Status: DC | PRN

## 2022-02-26 MED ORDER — ALPRAZOLAM 0.25 MG PO TABS: 0.2500 mg | ORAL_TABLET | Freq: Two times a day (BID) | ORAL | 0 refills | Status: DC | PRN

## 2022-02-26 NOTE — Telephone Encounter (Signed)
Patient is requesting a refill of the following medications: ?Requested Prescriptions  ? ?Pending Prescriptions Disp Refills  ? oxyCODONE-acetaminophen (PERCOCET/ROXICET) 5-325 MG tablet 30 tablet 0  ?  Sig: Take 1 tablet by mouth every 4 (four) hours as needed.  ? ? ?Date of patient request: 02/25/2022 ?Last office visit: 05/31/2021 ?Date of last refill: 01/31/2022 ?Last refill amount: 30 tablets  ?Follow up time period per chart:   ?

## 2022-03-14 ENCOUNTER — Other Ambulatory Visit: Payer: Self-pay

## 2022-03-14 ENCOUNTER — Other Ambulatory Visit: Payer: Self-pay | Admitting: Family Medicine

## 2022-03-14 DIAGNOSIS — M62838 Other muscle spasm: Secondary | ICD-10-CM

## 2022-03-14 MED ORDER — CYCLOBENZAPRINE HCL 10 MG PO TABS: 10.0000 mg | ORAL_TABLET | Freq: Three times a day (TID) | ORAL | 0 refills | Status: DC | PRN

## 2022-03-25 ENCOUNTER — Other Ambulatory Visit: Payer: Self-pay | Admitting: Family Medicine

## 2022-03-25 DIAGNOSIS — M62838 Other muscle spasm: Secondary | ICD-10-CM

## 2022-03-25 DIAGNOSIS — F411 Generalized anxiety disorder: Secondary | ICD-10-CM

## 2022-03-25 MED ORDER — ALPRAZOLAM 0.25 MG PO TABS: 0.2500 mg | ORAL_TABLET | Freq: Two times a day (BID) | ORAL | 0 refills | Status: DC | PRN

## 2022-03-25 MED ORDER — ONDANSETRON HCL 4 MG PO TABS: 4.0000 mg | ORAL_TABLET | Freq: Three times a day (TID) | ORAL | 0 refills | Status: DC | PRN

## 2022-03-25 MED ORDER — CYCLOBENZAPRINE HCL 10 MG PO TABS: 10.0000 mg | ORAL_TABLET | Freq: Three times a day (TID) | ORAL | 0 refills | Status: DC | PRN

## 2022-03-25 MED ORDER — OXYCODONE-ACETAMINOPHEN 5-325 MG PO TABS: 1.0000 | ORAL_TABLET | ORAL | 0 refills | Status: DC | PRN

## 2022-04-04 ENCOUNTER — Telehealth: Payer: BC Managed Care – PPO | Admitting: Family Medicine

## 2022-04-04 DIAGNOSIS — J029 Acute pharyngitis, unspecified: Secondary | ICD-10-CM

## 2022-04-04 MED ORDER — AMOXICILLIN 500 MG PO CAPS: 500.0000 mg | ORAL_CAPSULE | Freq: Two times a day (BID) | ORAL | 0 refills | Status: AC

## 2022-04-04 NOTE — Progress Notes (Signed)

## 2022-04-08 ENCOUNTER — Telehealth: Payer: BC Managed Care – PPO | Admitting: Physician Assistant

## 2022-04-08 DIAGNOSIS — J069 Acute upper respiratory infection, unspecified: Secondary | ICD-10-CM

## 2022-04-08 DIAGNOSIS — R0602 Shortness of breath: Secondary | ICD-10-CM

## 2022-04-08 NOTE — Progress Notes (Signed)
Based on what you shared with me, I feel your condition warrants further evaluation and I recommend that you be seen in a face to face visit. ? ?Giving you are already on antibiotics and now with new/worsening symptoms, you will require an in-person evaluation either with your PCP or at a local Urgent Care so we can make sure you get full assessment and proper ongoing treatments are started. I definitely recommend repeat lung examination giving the changes in your oxygen saturation with exertion.  ?  ?NOTE: There will be NO CHARGE for this eVisit ?  ?If you are having a true medical emergency please call 911.   ?  ? For an urgent face to face visit, [facility] has six urgent care centers for your convenience:  ?  ? [facility] at [facility] ?Get Driving Directions ?[phone] ?[phone] ?[place], [place] [address] ?  ? [facility]) ?Get Driving Directions ?[phone] ?[address] ?[place], [place] [address] ? ?[facility] [place]) ?Get Driving Directions ?[phone] ?[address] ?[place],  [place]  [address] ? ?[facility] ?Get Driving Directions ?[phone] ?[address] ?[place], [place] [address] ?  ?[facility] [place] ?Get Driving Directions  ?[phone] ?[address] ?[address] ?[place], [place] [address] ?  ?[facility] ?Get Driving Directions ?[phone] ?[address] ?[place], [place] [address] ? ?Your MyChart E-visit questionnaire answers were reviewed by a board certified advanced clinical practitioner to complete your personal care plan based on your specific symptoms.  Thank you for using e-Visits. ?  ? ?

## 2022-04-09 ENCOUNTER — Encounter: Payer: Self-pay | Admitting: Family Medicine

## 2022-04-09 ENCOUNTER — Ambulatory Visit: Payer: BC Managed Care – PPO | Admitting: Family Medicine

## 2022-04-09 VITALS — BP 138/82 | HR 119 | Temp 97.6°F | Resp 16 | Ht 64.0 in | Wt 190.4 lb

## 2022-04-09 DIAGNOSIS — J209 Acute bronchitis, unspecified: Secondary | ICD-10-CM

## 2022-04-09 DIAGNOSIS — J45909 Unspecified asthma, uncomplicated: Secondary | ICD-10-CM | POA: Diagnosis not present

## 2022-04-09 MED ORDER — BENZONATATE 100 MG PO CAPS: 100.0000 mg | ORAL_CAPSULE | Freq: Three times a day (TID) | ORAL | 0 refills | Status: DC | PRN

## 2022-04-09 MED ORDER — AZITHROMYCIN 250 MG PO TABS: ORAL_TABLET | ORAL | 0 refills | Status: DC

## 2022-04-09 NOTE — Progress Notes (Signed)
? ?  Subjective:  ? ? Patient ID: Lynn Campos, female    DOB: Mar 09, 1971, 51 y.o.   MRN: [id number] ? ?HPI ?Cough- pt had evisit on 5/11 for 'a killer sore throat' and started on Amox for suspected strep.  Throat improved the next day.  By Friday night felt 'really yucky'.  Developed cough and fever.  Saturday and Sunday had body aches.  Denies nasal congestion.  Tm 100.7  Had low grade temp this AM.  Has tested negative for COVID multiple times.  Feels short of breath and wheezy w/ exertion.  Using Albuterol ~3x/day.  Had another Evisit yesterday b/c cough was not improving.  Pt is a 1st grade teacher. ? ? ?Review of Systems ?For ROS see HPI  ?   ?Objective:  ? Physical Exam ?Vitals reviewed.  ?Constitutional:   ?   General: She is not in acute distress. ?   Appearance: Normal appearance. She is well-developed. She is not ill-appearing.  ?HENT:  ?   Head: Normocephalic and atraumatic.  ?   Right Ear: Tympanic membrane normal.  ?   Left Ear: Tympanic membrane normal.  ?   Nose: No congestion.  ?   Mouth/Throat:  ?   Pharynx: Oropharynx is clear. No oropharyngeal exudate or posterior oropharyngeal erythema.  ?Eyes:  ?   Conjunctiva/sclera: Conjunctivae normal.  ?   Pupils: Pupils are equal, round, and reactive to light.  ?Cardiovascular:  ?   Rate and Rhythm: Normal rate and regular rhythm.  ?   Heart sounds: Normal heart sounds. No murmur heard. ?Pulmonary:  ?   Effort: Pulmonary effort is normal. No respiratory distress.  ?   Breath sounds: Wheezing (faint expiratory wheezes at the bases) present.  ?Musculoskeletal:  ?   Cervical back: Normal range of motion and neck supple.  ?Lymphadenopathy:  ?   Cervical: No cervical adenopathy.  ?Skin: ?   General: Skin is warm and dry.  ?Neurological:  ?   General: No focal deficit present.  ?   Mental Status: She is alert and oriented to person, place, and time.  ?Psychiatric:     ?   Mood and Affect: Mood normal.     ?   Behavior: Behavior normal.  ? ? ? ? ? ?   ?Assessment &  Plan:  ? ?Bronchitis w/ asthma- new.  Suspect pt did have strep initially which is why her throat improved after 1-2 days on Amox.  I think she then got one of the viral flu like illnesses that are going around.  Now this seems to have settled in her chest.  Given her hx of asthma, will start a Zpack to cover for atypical infxns and cough pills for symptom relief.  Pt expressed understanding and is in agreement w/ plan.  ?

## 2022-04-09 NOTE — Patient Instructions (Signed)
Follow up as needed or as scheduled ?START the Zpack to cover atypical bronchitis or infection ?Drink LOTS of fluids! ?Start Delsym or Robitussin as needed for cough ?ADD the cough pills as needed ?Continue to use the inhaler as needed ?Call with any questions or concerns ?Hang in there!!! ?

## 2022-04-16 ENCOUNTER — Encounter: Payer: Self-pay | Admitting: Family Medicine

## 2022-04-19 ENCOUNTER — Ambulatory Visit: Payer: BC Managed Care – PPO | Admitting: Family Medicine

## 2022-04-22 ENCOUNTER — Other Ambulatory Visit: Payer: Self-pay | Admitting: Family Medicine

## 2022-04-22 DIAGNOSIS — M62838 Other muscle spasm: Secondary | ICD-10-CM

## 2022-04-22 DIAGNOSIS — F411 Generalized anxiety disorder: Secondary | ICD-10-CM

## 2022-04-23 NOTE — Telephone Encounter (Signed)
Patient is requesting a refill of the following medications: Requested Prescriptions   Pending Prescriptions Disp Refills   oxyCODONE-acetaminophen (PERCOCET/ROXICET) 5-325 MG tablet 30 tablet 0    Sig: Take 1 tablet by mouth every 4 (four) hours as needed.   ALPRAZolam (XANAX) 0.25 MG tablet 30 tablet 0    Sig: Take 1 tablet (0.25 mg total) by mouth 2 (two) times daily as needed for anxiety.   cyclobenzaprine (FLEXERIL) 10 MG tablet 30 tablet 0    Sig: Take 1 tablet (10 mg total) by mouth 3 (three) times daily as needed for muscle spasms.    Date of patient request: 04/22/2022 Last office visit: 04/09/2022 Date of last refill: 03/25/2022 Last refill amount: 30 tablets  Follow up time period per chart: 04/25/2022

## 2022-04-24 MED ORDER — ALPRAZOLAM 0.25 MG PO TABS: 0.2500 mg | ORAL_TABLET | Freq: Two times a day (BID) | ORAL | 0 refills | Status: DC | PRN

## 2022-04-24 MED ORDER — CYCLOBENZAPRINE HCL 10 MG PO TABS: 10.0000 mg | ORAL_TABLET | Freq: Three times a day (TID) | ORAL | 0 refills | Status: DC | PRN

## 2022-04-24 MED ORDER — OXYCODONE-ACETAMINOPHEN 5-325 MG PO TABS: 1.0000 | ORAL_TABLET | ORAL | 0 refills | Status: DC | PRN

## 2022-04-25 ENCOUNTER — Ambulatory Visit: Payer: BC Managed Care – PPO | Admitting: Family Medicine

## 2022-04-25 ENCOUNTER — Encounter: Payer: Self-pay | Admitting: Family Medicine

## 2022-04-25 ENCOUNTER — Other Ambulatory Visit: Payer: Self-pay

## 2022-04-25 VITALS — BP 130/88 | HR 97 | Temp 97.8°F | Resp 18 | Ht 64.0 in | Wt 196.6 lb

## 2022-04-25 DIAGNOSIS — E669 Obesity, unspecified: Secondary | ICD-10-CM

## 2022-04-25 MED ORDER — WEGOVY 0.25 MG/0.5ML ~~LOC~~ SOAJ: 0.2500 mg | SUBCUTANEOUS | 1 refills | Status: DC

## 2022-04-25 NOTE — Progress Notes (Signed)
   Subjective:    Patient ID: Lynn Campos, female    DOB: December 22, 1970, 51 y.o.   MRN: [id number]  HPI Obesity- pt's BMI currently 33.75,  She has gained 6.5 lbs in last 2 weeks.  Is interested in [place].  Pt does have bariatric medication benefit.  Pt is concerned b/c family history is + for HTN, DM.  Already has hyperlipidemia.  Walking regularly.  Plans to increase physical activity once school is out next week.  Was previously up at 4:30am to use elliptical and bike.  Was also on [employer] and knows the importance of low carb diet.   Review of Systems For ROS see HPI     Objective:   Physical Exam Vitals reviewed.  Constitutional:      General: She is not in acute distress.    Appearance: Normal appearance. She is obese. She is not ill-appearing.  HENT:     Head: Normocephalic and atraumatic.  Eyes:     Extraocular Movements: Extraocular movements intact.     Conjunctiva/sclera: Conjunctivae normal.     Pupils: Pupils are equal, round, and reactive to light.  Cardiovascular:     Rate and Rhythm: Normal rate and regular rhythm.  Pulmonary:     Effort: Pulmonary effort is normal. No respiratory distress.     Breath sounds: Normal breath sounds. No wheezing.  Musculoskeletal:     Cervical back: Normal range of motion and neck supple.  Skin:    General: Skin is warm and dry.  Neurological:     General: No focal deficit present.     Mental Status: She is alert and oriented to person, place, and time.  Psychiatric:        Mood and Affect: Mood normal.        Behavior: Behavior normal.        Thought Content: Thought content normal.           Assessment & Plan:   Obesity- ongoing issue for pt but worsening over the last few weeks.  She has done [employer] and is aware of the importance of low carb diet.  Was waking up early and doing elliptical but as the end of school got busy, this fell by the wayside.  She plans to resume once school is out.  Check labs to risk stratify and will start  Wegovy at pt request.  Discussed appropriate use, need for titration, possible side effects, and importance of being used in combination w/ low carb diet and regular exercise.  Pt expressed understanding and is in agreement w/ plan.

## 2022-04-25 NOTE — Patient Instructions (Addendum)
Follow up in 1 month to recheck weight loss We'll notify you of your lab results and make any changes if needed Continue to work on low carb diet and regular exercise- you can do it! START the [facility] injection weekly Call with any questions or concerns You can do this!!!

## 2022-04-26 LAB — BASIC METABOLIC PANEL
BUN: 12 mg/dL (ref 6–23)
CO2: 28 mEq/L (ref 19–32)
Calcium: 9.1 mg/dL (ref 8.4–10.5)
Chloride: 103 mEq/L (ref 96–112)
Creatinine, Ser: 0.75 mg/dL (ref 0.40–1.20)
GFR: 91.04 mL/min (ref >60.00)
Glucose, Bld: 99 mg/dL (ref 70–99)
Potassium: 4.1 mEq/L (ref 3.5–5.1)
Sodium: 138 mEq/L (ref 135–145)

## 2022-04-26 LAB — CBC WITH DIFFERENTIAL/PLATELET
Basophils Absolute: 0.1 10*3/uL (ref 0.0–0.1)
Basophils Relative: 1.3 % (ref 0.0–3.0)
Eosinophils Absolute: 0.3 10*3/uL (ref 0.0–0.7)
Eosinophils Relative: 5.3 % — ABNORMAL HIGH (ref 0.0–5.0)
HCT: 36.9 % (ref 36.0–46.0)
Hemoglobin: 12.4 g/dL (ref 12.0–15.0)
Lymphocytes Relative: 29.2 % (ref 12.0–46.0)
Lymphs Abs: 1.4 10*3/uL (ref 0.7–4.0)
MCHC: 33.5 g/dL (ref 30.0–36.0)
MCV: 89.2 fl (ref 78.0–100.0)
Monocytes Absolute: 0.4 10*3/uL (ref 0.1–1.0)
Monocytes Relative: 9 % (ref 3.0–12.0)
Neutro Abs: 2.7 10*3/uL (ref 1.4–7.7)
Neutrophils Relative %: 55.2 % (ref 43.0–77.0)
Platelets: 218 10*3/uL (ref 150.0–400.0)
RBC: 4.14 Mil/uL (ref 3.87–5.11)
RDW: 13.3 % (ref 11.5–15.5)
WBC: 4.9 10*3/uL (ref 4.0–10.5)

## 2022-04-26 LAB — LIPID PANEL
Cholesterol: 191 mg/dL (ref 0–200)
HDL: 46.1 mg/dL (ref >39.00)
LDL Cholesterol: 106 mg/dL — ABNORMAL HIGH (ref 0–99)
NonHDL: 145.35
Total CHOL/HDL Ratio: 4
Triglycerides: 195 mg/dL — ABNORMAL HIGH (ref 0.0–149.0)
VLDL: 39 mg/dL (ref 0.0–40.0)

## 2022-04-26 LAB — HEPATIC FUNCTION PANEL
ALT: 21 U/L (ref 0–35)
AST: 16 U/L (ref 0–37)
Albumin: 4.2 g/dL (ref 3.5–5.2)
Alkaline Phosphatase: 50 U/L (ref 39–117)
Bilirubin, Direct: 0.1 mg/dL (ref 0.0–0.3)
Total Bilirubin: 0.3 mg/dL (ref 0.2–1.2)
Total Protein: 6.7 g/dL (ref 6.0–8.3)

## 2022-04-26 LAB — VITAMIN D 25 HYDROXY (VIT D DEFICIENCY, FRACTURES): VITD: 28.61 ng/mL — ABNORMAL LOW (ref 30.00–100.00)

## 2022-04-26 LAB — TSH: TSH: 2.5 u[IU]/mL (ref 0.35–5.50)

## 2022-04-29 ENCOUNTER — Telehealth: Payer: Self-pay

## 2022-04-29 NOTE — Telephone Encounter (Signed)
Spoke w/ pt and advised of lab results  

## 2022-04-29 NOTE — Telephone Encounter (Signed)
-----   Message from Sheliah Hatch, MD sent at 04/26/2022  3:33 PM EDT ----- Labs look good w/ exception of mildly low Vit D.  Please make sure you are taking a daily OTC supplement of at least 2000 units.

## 2022-04-30 ENCOUNTER — Telehealth: Payer: Self-pay

## 2022-04-30 NOTE — Telephone Encounter (Signed)
PA has been submitted for [facility] waiting on response

## 2022-05-01 NOTE — Telephone Encounter (Signed)
We can just let pt know that Reginal Lutes has been denied by insurance (and all starting doses are on backorder until at least September per the manufacturer).  At this time, I would work on healthy diet and regular exercise and we'll see if we can revisit the medication in the future

## 2022-05-01 NOTE — Telephone Encounter (Signed)
Spoke w/ pt and advised that insurance denied the [facility] advised to work on healthy diet and regular exercise and we will revisit the medication in the future .

## 2022-05-03 NOTE — Telephone Encounter (Signed)
Will hold off on appeal/letter of necessity b/c the medication is not available at this time

## 2022-05-06 ENCOUNTER — Other Ambulatory Visit: Payer: Self-pay

## 2022-05-06 ENCOUNTER — Encounter: Payer: Self-pay | Admitting: Family Medicine

## 2022-05-06 DIAGNOSIS — J45909 Unspecified asthma, uncomplicated: Secondary | ICD-10-CM

## 2022-05-06 MED ORDER — ALBUTEROL SULFATE HFA 108 (90 BASE) MCG/ACT IN AERS: 2.0000 | INHALATION_SPRAY | RESPIRATORY_TRACT | 12 refills | Status: DC | PRN

## 2022-05-21 ENCOUNTER — Other Ambulatory Visit: Payer: Self-pay | Admitting: Family Medicine

## 2022-05-21 DIAGNOSIS — M62838 Other muscle spasm: Secondary | ICD-10-CM

## 2022-05-21 DIAGNOSIS — F411 Generalized anxiety disorder: Secondary | ICD-10-CM

## 2022-05-21 MED ORDER — OXYCODONE-ACETAMINOPHEN 5-325 MG PO TABS: 1.0000 | ORAL_TABLET | ORAL | 0 refills | Status: DC | PRN

## 2022-05-21 MED ORDER — CYCLOBENZAPRINE HCL 10 MG PO TABS: 10.0000 mg | ORAL_TABLET | Freq: Three times a day (TID) | ORAL | 0 refills | Status: DC | PRN

## 2022-05-21 MED ORDER — ALPRAZOLAM 0.25 MG PO TABS: 0.2500 mg | ORAL_TABLET | Freq: Two times a day (BID) | ORAL | 0 refills | Status: DC | PRN

## 2022-05-21 NOTE — Telephone Encounter (Signed)
Patient is requesting a refill of the following medications: Requested Prescriptions   Pending Prescriptions Disp Refills   oxyCODONE-acetaminophen (PERCOCET/ROXICET) 5-325 MG tablet 30 tablet 0    Sig: Take 1 tablet by mouth every 4 (four) hours as needed.   ALPRAZolam (XANAX) 0.25 MG tablet 30 tablet 0    Sig: Take 1 tablet (0.25 mg total) by mouth 2 (two) times daily as needed for anxiety.   cyclobenzaprine (FLEXERIL) 10 MG tablet 30 tablet 0    Sig: Take 1 tablet (10 mg total) by mouth 3 (three) times daily as needed for muscle spasms.    Date of patient request: 05/21/22 Last office visit: 04/25/22 Date of last refill: 04/24/22 Last refill amount: 30

## 2022-05-29 ENCOUNTER — Ambulatory Visit: Payer: BC Managed Care – PPO | Admitting: Family Medicine

## 2022-06-10 ENCOUNTER — Telehealth: Payer: Self-pay

## 2022-06-11 NOTE — Telephone Encounter (Signed)
Medication was denied w/ PA .

## 2022-06-18 ENCOUNTER — Other Ambulatory Visit: Payer: Self-pay | Admitting: Family Medicine

## 2022-06-18 DIAGNOSIS — F411 Generalized anxiety disorder: Secondary | ICD-10-CM

## 2022-06-18 DIAGNOSIS — M62838 Other muscle spasm: Secondary | ICD-10-CM

## 2022-06-19 MED ORDER — ALPRAZOLAM 0.25 MG PO TABS: 0.2500 mg | ORAL_TABLET | Freq: Two times a day (BID) | ORAL | 0 refills | Status: DC | PRN

## 2022-06-19 MED ORDER — CYCLOBENZAPRINE HCL 10 MG PO TABS: 10.0000 mg | ORAL_TABLET | Freq: Three times a day (TID) | ORAL | 0 refills | Status: DC | PRN

## 2022-06-19 MED ORDER — OXYCODONE-ACETAMINOPHEN 5-325 MG PO TABS
1.0000 | ORAL_TABLET | ORAL | 0 refills | Status: DC | PRN
Start: 2022-06-19 — End: 2022-07-17

## 2022-07-17 ENCOUNTER — Other Ambulatory Visit: Payer: Self-pay | Admitting: Family Medicine

## 2022-07-17 DIAGNOSIS — M62838 Other muscle spasm: Secondary | ICD-10-CM

## 2022-07-17 DIAGNOSIS — F411 Generalized anxiety disorder: Secondary | ICD-10-CM

## 2022-07-17 MED ORDER — OXYCODONE-ACETAMINOPHEN 5-325 MG PO TABS: 1.0000 | ORAL_TABLET | ORAL | 0 refills | Status: DC | PRN

## 2022-07-17 MED ORDER — ALPRAZOLAM 0.25 MG PO TABS: 0.2500 mg | ORAL_TABLET | Freq: Two times a day (BID) | ORAL | 0 refills | Status: DC | PRN

## 2022-07-17 MED ORDER — CYCLOBENZAPRINE HCL 10 MG PO TABS: 10.0000 mg | ORAL_TABLET | Freq: Three times a day (TID) | ORAL | 0 refills | Status: DC | PRN

## 2022-08-14 ENCOUNTER — Encounter: Payer: Self-pay | Admitting: Family Medicine

## 2022-08-14 DIAGNOSIS — M62838 Other muscle spasm: Secondary | ICD-10-CM

## 2022-08-14 DIAGNOSIS — F411 Generalized anxiety disorder: Secondary | ICD-10-CM

## 2022-08-14 MED ORDER — OXYCODONE-ACETAMINOPHEN 5-325 MG PO TABS: 1.0000 | ORAL_TABLET | ORAL | 0 refills | Status: DC | PRN

## 2022-08-14 MED ORDER — CYCLOBENZAPRINE HCL 10 MG PO TABS: 10.0000 mg | ORAL_TABLET | Freq: Three times a day (TID) | ORAL | 0 refills | Status: DC | PRN

## 2022-08-14 MED ORDER — ALPRAZOLAM 0.25 MG PO TABS: 0.2500 mg | ORAL_TABLET | Freq: Two times a day (BID) | ORAL | 0 refills | Status: DC | PRN

## 2022-08-14 NOTE — Telephone Encounter (Signed)
Patient is requesting a refill of the following medications: Requested Prescriptions   Pending Prescriptions Disp Refills   ALPRAZolam (XANAX) 0.25 MG tablet 30 tablet 0    Sig: Take 1 tablet (0.25 mg total) by mouth 2 (two) times daily as needed for anxiety.   oxyCODONE-acetaminophen (PERCOCET/ROXICET) 5-325 MG tablet 30 tablet 0    Sig: Take 1 tablet by mouth every 4 (four) hours as needed.   cyclobenzaprine (FLEXERIL) 10 MG tablet 30 tablet 0    Sig: Take 1 tablet (10 mg total) by mouth 3 (three) times daily as needed for muscle spasms.    Date of patient request: 08/14/22 Last office visit: 04/25/22 Date of last refill: 07/17/22 Last refill amount: 30

## 2022-08-15 ENCOUNTER — Other Ambulatory Visit: Payer: Self-pay | Admitting: Family Medicine

## 2022-08-15 DIAGNOSIS — M62838 Other muscle spasm: Secondary | ICD-10-CM

## 2022-08-15 DIAGNOSIS — F411 Generalized anxiety disorder: Secondary | ICD-10-CM

## 2022-08-27 ENCOUNTER — Encounter: Payer: Self-pay | Admitting: Family

## 2022-08-27 ENCOUNTER — Telehealth (INDEPENDENT_AMBULATORY_CARE_PROVIDER_SITE_OTHER): Payer: BC Managed Care – PPO | Admitting: Family

## 2022-08-27 VITALS — BP 153/93 | HR 103 | Temp 99.8°F | Ht 64.0 in | Wt 190.0 lb

## 2022-08-27 DIAGNOSIS — B9689 Other specified bacterial agents as the cause of diseases classified elsewhere: Secondary | ICD-10-CM | POA: Diagnosis not present

## 2022-08-27 DIAGNOSIS — R0789 Other chest pain: Secondary | ICD-10-CM

## 2022-08-27 DIAGNOSIS — J019 Acute sinusitis, unspecified: Secondary | ICD-10-CM | POA: Diagnosis not present

## 2022-08-27 MED ORDER — METHYLPREDNISOLONE 4 MG PO TBPK: ORAL_TABLET | ORAL | 0 refills | Status: DC

## 2022-08-27 MED ORDER — AMOXICILLIN 500 MG PO CAPS: 500.0000 mg | ORAL_CAPSULE | Freq: Three times a day (TID) | ORAL | 0 refills | Status: AC

## 2022-08-27 NOTE — Progress Notes (Signed)
Virtual Visit via Video   I connected with patient on 08/27/22 at 10:00 AM EDT by a video enabled telemedicine application and verified that I am speaking with the correct person using two identifiers.  Location patient: Home Location provider: [facility] Persons participating in the virtual visit: Patient, Provider, CMA  I discussed the limitations of evaluation and management by telemedicine and the availability of in person appointments. The patient expressed understanding and agreed to proceed.  Subjective:   HPI:   51 year old female present via video visit with concerns of sinus pressure, sinus pain, cough, low grade fever 100, and sneezing x 4 days and worsening. She also reports chest wall pain on the left side that is worse when she coughs or sneezes. Has taken a COVID test that was negative. Has been taking OTC cough medication w/o much relief. Feels like symptoms are spreading to her chest. She is a 1st grade teacher. She has a history of asthma and has had to use her inhaler.   ROS:   See pertinent positives and negatives per HPI.  Patient Active Problem List   Diagnosis Date Noted   Osteoarthritis of left knee 05/31/2020   Obesity (BMI 30-39.9) 03/29/2019   Encounter for chronic pain management 10/05/2018   Cervicalgia 06/29/2018   Screening for malignant neoplasm of cervix 09/20/2013   Physical exam 09/20/2013   S/P cervical discectomy 06/25/2013   Trapezius muscle spasm 12/11/2012   Shoulder pain 12/11/2012   Keratosis 11/19/2012   Asthma, intrinsic 10/14/2011   Anxiety state 08/14/2010   ROSACEA 07/25/2009   MITRAL REGURGITATION 02/18/2009   GERD 02/18/2009   LUPUS 02/18/2009   PALPITATIONS 02/18/2009    Social History   Tobacco Use   Smoking status: Never   Smokeless tobacco: Never  Substance Use Topics   Alcohol use: Yes    Comment: special occasions    Current Outpatient Medications:    albuterol (PROVENTIL HFA) 108 (90 Base)  MCG/ACT inhaler, Inhale 2 puffs into the lungs every 4 (four) hours as needed for wheezing., Disp: 1 each, Rfl: 12   ALPRAZolam (XANAX) 0.25 MG tablet, Take 1 tablet (0.25 mg total) by mouth 2 (two) times daily as needed for anxiety., Disp: 30 tablet, Rfl: 0   amoxicillin (AMOXIL) 500 MG capsule, Take 1 capsule (500 mg total) by mouth 3 (three) times daily for 10 days., Disp: 30 capsule, Rfl: 0   cyclobenzaprine (FLEXERIL) 10 MG tablet, Take 1 tablet (10 mg total) by mouth 3 (three) times daily as needed for muscle spasms., Disp: 30 tablet, Rfl: 0   EPINEPHrine 0.3 mg/0.3 mL IJ SOAJ injection, Inject 0.3 mLs (0.3 mg total) into the muscle once., Disp: 1 Device, Rfl: 2   methylPREDNISolone (MEDROL DOSEPAK) 4 MG TBPK tablet, As directed, Disp: 21 tablet, Rfl: 0   oxyCODONE-acetaminophen (PERCOCET/ROXICET) 5-325 MG tablet, Take 1 tablet by mouth every 4 (four) hours as needed., Disp: 30 tablet, Rfl: 0   vitamin C (ASCORBIC ACID) 500 MG tablet, Take 500 mg by mouth 2 (two) times daily., Disp: , Rfl:    ondansetron (ZOFRAN) 4 MG tablet, Take 1 tablet (4 mg total) by mouth every 8 (eight) hours as needed for nausea or vomiting. (Patient not taking: Reported on 08/27/2022), Disp: 30 tablet, Rfl: 0   Semaglutide-Weight Management (WEGOVY) 0.25 MG/0.5ML SOAJ, Inject 0.25 mg into the skin once a week. (Patient not taking: Reported on 08/27/2022), Disp: 2 mL, Rfl: 1  Current Facility-Administered Medications:  0.9 %  sodium chloride infusion, 500 mL, Intravenous, Once, Danis, Kirke Corin, MD  Allergies  Allergen Reactions   Flovent [Fluticasone Propionate]     Difficulty breathing. No issues with Qvar Rxed by Dr Gwenette Greet   Codeine     REACTION: vomiting   Fluticasone    Fluticasone Furoate    Mushroom Extract Complex Swelling    Pt reports hand swelling when consuming mushrooms.    Other     Bee stings and pecan nuts     Objective:   BP (!) 153/93   Pulse (!) 103   Temp 99.8 F (37.7 C)   Ht 5'  4" (1.626 m)   Wt 190 lb (86.2 kg)   SpO2 96%   BMI 32.61 kg/m   Patient is well-developed, well-nourished in no acute distress.  Resting comfortably at home.  Head is normocephalic, atraumatic.  No labored breathing. Reproducible chest wall pain to the left 5th ICP.  Speech is clear and coherent with logical content.  Patient is alert and oriented at baseline.    Assessment and Plan:    Laney was seen today for covid exposure.  Diagnoses and all orders for this visit:  Acute bacterial sinusitis  Costochondral chest pain  Other orders -     amoxicillin (AMOXIL) 500 MG capsule; Take 1 capsule (500 mg total) by mouth 3 (three) times daily for 10 days. -     methylPREDNISolone (MEDROL DOSEPAK) 4 MG TBPK tablet; As directed   Call the office if symptoms worsen or persist. Recheck as scheduled and sooner as needed.   Kennyth Arnold, FNP 08/27/2022  Time spent with the patient: 25 minutes, of which >50% was spent in obtaining information about symptoms, reviewing previous labs, evaluations, and treatments, counseling about condition (please see the discussed topics above), and developing a plan to further investigate it; had a number of questions which I addressed.

## 2022-09-09 ENCOUNTER — Encounter: Payer: Self-pay | Admitting: Family Medicine

## 2022-09-09 ENCOUNTER — Ambulatory Visit (INDEPENDENT_AMBULATORY_CARE_PROVIDER_SITE_OTHER): Payer: BC Managed Care – PPO | Admitting: Family Medicine

## 2022-09-09 DIAGNOSIS — I10 Essential (primary) hypertension: Secondary | ICD-10-CM

## 2022-09-09 MED ORDER — HYDROCHLOROTHIAZIDE 12.5 MG PO TABS: 12.5000 mg | ORAL_TABLET | Freq: Every day | ORAL | 3 refills | Status: DC

## 2022-09-09 NOTE — Patient Instructions (Signed)
Follow up in 3-4 weeks to recheck blood pressure START the hydrochlorothiazide (HCTZ) once daily Make sure you are drinking plenty of fluids Try and limit your salt intake Call with any questions or concerns Hang in there!!!

## 2022-09-09 NOTE — Progress Notes (Unsigned)
   Subjective:    Patient ID: Lynn Campos, female    DOB: 06/12/1971, 51 y.o.   MRN: [id number]  HPI Elevated BP- pt is concerned given strong family hx of HTN.  Pt had video visit a few weeks ago and noted that BP was elevated.  Initially thought BP elevation was due to illness.  Has vise like HA at top of head.  + nausea.  Home BPs 150-160/90s.  BP at work was 168/109.  Work has been difficult.  Pt reports she has been retaining fluid.     Review of Systems For ROS see HPI     Objective:   Physical Exam Vitals reviewed.  Constitutional:      General: She is not in acute distress.    Appearance: Normal appearance. She is well-developed. She is not ill-appearing.  HENT:     Head: Normocephalic and atraumatic.  Eyes:     Conjunctiva/sclera: Conjunctivae normal.     Pupils: Pupils are equal, round, and reactive to light.  Neck:     Thyroid: No thyromegaly.  Cardiovascular:     Rate and Rhythm: Normal rate and regular rhythm.     Pulses: Normal pulses.     Heart sounds: Normal heart sounds. No murmur heard. Pulmonary:     Effort: Pulmonary effort is normal. No respiratory distress.     Breath sounds: Normal breath sounds.  Abdominal:     General: There is no distension.     Palpations: Abdomen is soft.     Tenderness: There is no abdominal tenderness.  Musculoskeletal:     Cervical back: Normal range of motion and neck supple.     Right lower leg: No edema.     Left lower leg: No edema.  Lymphadenopathy:     Cervical: No cervical adenopathy.  Skin:    General: Skin is warm and dry.  Neurological:     General: No focal deficit present.     Mental Status: She is alert and oriented to person, place, and time.  Psychiatric:        Mood and Affect: Mood normal.        Behavior: Behavior normal.           Assessment & Plan:

## 2022-09-10 NOTE — Assessment & Plan Note (Signed)
New.  Pt reports BP has been elevated since her sick visit earlier this month.  Strong family hx of HTN.  Now having vise like headaches and some swelling.  Will start low dose HCTZ 12.5mg  daily and monitor for BP improvement.  She is to return in a few weeks to recheck pressure and BMP due to diuretic use.  Reviewed supportive care and red flags that should prompt return.  Pt expressed understanding and is in agreement w/ plan.

## 2022-09-11 ENCOUNTER — Other Ambulatory Visit: Payer: Self-pay | Admitting: Family Medicine

## 2022-09-11 ENCOUNTER — Encounter: Payer: Self-pay | Admitting: Family Medicine

## 2022-09-11 DIAGNOSIS — M62838 Other muscle spasm: Secondary | ICD-10-CM

## 2022-09-11 DIAGNOSIS — F411 Generalized anxiety disorder: Secondary | ICD-10-CM

## 2022-09-11 MED ORDER — OXYCODONE-ACETAMINOPHEN 5-325 MG PO TABS: 1.0000 | ORAL_TABLET | ORAL | 0 refills | Status: DC | PRN

## 2022-09-11 MED ORDER — CYCLOBENZAPRINE HCL 10 MG PO TABS: 10.0000 mg | ORAL_TABLET | Freq: Three times a day (TID) | ORAL | 0 refills | Status: DC | PRN

## 2022-09-11 MED ORDER — ALPRAZOLAM 0.25 MG PO TABS: 0.2500 mg | ORAL_TABLET | Freq: Two times a day (BID) | ORAL | 0 refills | Status: DC | PRN

## 2022-09-11 NOTE — Telephone Encounter (Signed)
Patient is requesting a refill of the following medications: Requested Prescriptions   Pending Prescriptions Disp Refills   oxyCODONE-acetaminophen (PERCOCET/ROXICET) 5-325 MG tablet 30 tablet 0    Sig: Take 1 tablet by mouth every 4 (four) hours as needed.    Date of patient request: 09/11/22 Last office visit: 09/09/22 Date of last refill: 08/14/22 Last refill amount: 30 Follow up time period per chart: 3-4weeks

## 2022-09-11 NOTE — Telephone Encounter (Signed)
Patient is requesting a refill of the following medications: Requested Prescriptions   Pending Prescriptions Disp Refills   ALPRAZolam (XANAX) 0.25 MG tablet 30 tablet 0    Sig: Take 1 tablet (0.25 mg total) by mouth 2 (two) times daily as needed for anxiety.   cyclobenzaprine (FLEXERIL) 10 MG tablet 30 tablet 0    Sig: Take 1 tablet (10 mg total) by mouth 3 (three) times daily as needed for muscle spasms.    Date of patient request: 09/11/22 Last office visit: 09/09/22 Date of last refill: 08/14/22 Last refill amount: 30

## 2022-09-27 ENCOUNTER — Telehealth: Payer: Self-pay

## 2022-09-27 NOTE — Telephone Encounter (Signed)
Patient next colonoscopy due October 2032

## 2022-10-02 ENCOUNTER — Encounter: Payer: Self-pay | Admitting: Family Medicine

## 2022-10-02 ENCOUNTER — Ambulatory Visit (INDEPENDENT_AMBULATORY_CARE_PROVIDER_SITE_OTHER): Payer: BC Managed Care – PPO | Admitting: Family Medicine

## 2022-10-02 VITALS — BP 128/82 | HR 89 | Temp 97.9°F | Resp 17 | Ht 64.0 in | Wt 192.1 lb

## 2022-10-02 DIAGNOSIS — I10 Essential (primary) hypertension: Secondary | ICD-10-CM

## 2022-10-02 NOTE — Assessment & Plan Note (Signed)
New dx at last visit.  Started HCTZ 12.5mg  daily and BP has responded well.  Currently asymptomatic.  Check labs due to addition of diuretic but no anticipated med changes.

## 2022-10-02 NOTE — Patient Instructions (Signed)
Schedule your complete physical in 3 months We'll notify you of your lab results and make any changes if needed CONTINUE the HCTZ daily Keep up the good work!  You look great!! Call with any questions or concerns Happy Holidays!!!

## 2022-10-02 NOTE — Progress Notes (Signed)
   Subjective:    Patient ID: Lynn Campos, female    DOB: 24-Dec-1970, 51 y.o.   MRN: [id number]  HPI HTN- pt was started on HCTZ 12.5mg  daily at last visit.  HA resolved 4 days after starting medication.  No CP, SOB, visual changes, edema.  Pt was able to walk 10 miles this weekend w/o difficulty.   Review of Systems For ROS see HPI     Objective:   Physical Exam Vitals reviewed.  Constitutional:      General: She is not in acute distress.    Appearance: Normal appearance. She is well-developed. She is not ill-appearing.  HENT:     Head: Normocephalic and atraumatic.  Eyes:     Conjunctiva/sclera: Conjunctivae normal.     Pupils: Pupils are equal, round, and reactive to light.  Neck:     Thyroid: No thyromegaly.  Cardiovascular:     Rate and Rhythm: Normal rate and regular rhythm.     Pulses: Normal pulses.     Heart sounds: Normal heart sounds. No murmur heard. Pulmonary:     Effort: Pulmonary effort is normal. No respiratory distress.     Breath sounds: Normal breath sounds.  Abdominal:     General: There is no distension.     Palpations: Abdomen is soft.     Tenderness: There is no abdominal tenderness.  Musculoskeletal:     Cervical back: Normal range of motion and neck supple.     Right lower leg: No edema.     Left lower leg: No edema.  Lymphadenopathy:     Cervical: No cervical adenopathy.  Skin:    General: Skin is warm and dry.  Neurological:     Mental Status: She is alert and oriented to person, place, and time.  Psychiatric:        Behavior: Behavior normal.           Assessment & Plan:

## 2022-10-03 ENCOUNTER — Telehealth: Payer: Self-pay

## 2022-10-03 LAB — BASIC METABOLIC PANEL
BUN: 13 mg/dL (ref 6–23)
CO2: 31 mEq/L (ref 19–32)
Calcium: 9.1 mg/dL (ref 8.4–10.5)
Chloride: 99 mEq/L (ref 96–112)
Creatinine, Ser: 0.7 mg/dL (ref 0.40–1.20)
GFR: 98.6 mL/min (ref >60.00)
Glucose, Bld: 90 mg/dL (ref 70–99)
Potassium: 3.6 mEq/L (ref 3.5–5.1)
Sodium: 138 mEq/L (ref 135–145)

## 2022-10-03 NOTE — Telephone Encounter (Signed)
Left pt a VM with lab results  

## 2022-10-03 NOTE — Telephone Encounter (Signed)
-----   Message from Katherine E Tabori, MD sent at 10/03/2022  3:20 PM EST ----- Labs look great!  No changes at this time 

## 2022-10-08 ENCOUNTER — Other Ambulatory Visit: Payer: Self-pay | Admitting: Family Medicine

## 2022-10-08 DIAGNOSIS — F411 Generalized anxiety disorder: Secondary | ICD-10-CM

## 2022-10-08 DIAGNOSIS — M62838 Other muscle spasm: Secondary | ICD-10-CM

## 2022-10-09 MED ORDER — ONDANSETRON HCL 4 MG PO TABS: 4.0000 mg | ORAL_TABLET | Freq: Three times a day (TID) | ORAL | 0 refills | Status: DC | PRN

## 2022-10-09 MED ORDER — CYCLOBENZAPRINE HCL 10 MG PO TABS: 10.0000 mg | ORAL_TABLET | Freq: Three times a day (TID) | ORAL | 0 refills | Status: DC | PRN

## 2022-10-09 MED ORDER — OXYCODONE-ACETAMINOPHEN 5-325 MG PO TABS: 1.0000 | ORAL_TABLET | ORAL | 0 refills | Status: DC | PRN

## 2022-10-09 MED ORDER — ALPRAZOLAM 0.25 MG PO TABS: 0.2500 mg | ORAL_TABLET | Freq: Two times a day (BID) | ORAL | 0 refills | Status: DC | PRN

## 2022-10-09 NOTE — Telephone Encounter (Signed)
Patient is requesting a refill of the following medications: Requested Prescriptions   Pending Prescriptions Disp Refills   ondansetron (ZOFRAN) 4 MG tablet 30 tablet 0    Sig: Take 1 tablet (4 mg total) by mouth every 8 (eight) hours as needed for nausea or vomiting.   ALPRAZolam (XANAX) 0.25 MG tablet 30 tablet 0    Sig: Take 1 tablet (0.25 mg total) by mouth 2 (two) times daily as needed for anxiety.   cyclobenzaprine (FLEXERIL) 10 MG tablet 30 tablet 0    Sig: Take 1 tablet (10 mg total) by mouth 3 (three) times daily as needed for muscle spasms.   oxyCODONE-acetaminophen (PERCOCET/ROXICET) 5-325 MG tablet 30 tablet 0    Sig: Take 1 tablet by mouth every 4 (four) hours as needed.    Date of patient request: 10/09/22 Last office visit: 10/02/22 Date of last refill: 03/25/22, 09/11/22 Last refill amount: 30 Follow up time period per chart: 3 months

## 2022-11-06 ENCOUNTER — Other Ambulatory Visit: Payer: Self-pay | Admitting: Family Medicine

## 2022-11-06 DIAGNOSIS — M62838 Other muscle spasm: Secondary | ICD-10-CM

## 2022-11-06 DIAGNOSIS — F411 Generalized anxiety disorder: Secondary | ICD-10-CM

## 2022-11-06 MED ORDER — ALPRAZOLAM 0.25 MG PO TABS: 0.2500 mg | ORAL_TABLET | Freq: Two times a day (BID) | ORAL | 0 refills | Status: DC | PRN

## 2022-11-06 MED ORDER — CYCLOBENZAPRINE HCL 10 MG PO TABS: 10.0000 mg | ORAL_TABLET | Freq: Three times a day (TID) | ORAL | 0 refills | Status: DC | PRN

## 2022-11-06 MED ORDER — OXYCODONE-ACETAMINOPHEN 5-325 MG PO TABS: 1.0000 | ORAL_TABLET | ORAL | 0 refills | Status: DC | PRN

## 2022-11-06 NOTE — Telephone Encounter (Signed)
Informed pt that her Rx has been sent in 

## 2022-11-06 NOTE — Telephone Encounter (Signed)
Xanax 0.5 mg LOV: 10/02/22 Last Refill:10/09/22 Upcoming appt: none  Oxycodone 5-325 mg Filled 10/09/22

## 2022-11-28 ENCOUNTER — Encounter: Payer: Self-pay | Admitting: Family Medicine

## 2022-12-03 ENCOUNTER — Other Ambulatory Visit: Payer: Self-pay | Admitting: Family Medicine

## 2022-12-03 DIAGNOSIS — F411 Generalized anxiety disorder: Secondary | ICD-10-CM

## 2022-12-03 DIAGNOSIS — M62838 Other muscle spasm: Secondary | ICD-10-CM

## 2022-12-03 NOTE — Telephone Encounter (Signed)
Xanax 0.25 mg LOV: 10/02/22 Last Refill:11/06/22 Upcoming appt: no apt Oxycodone 5-325 mg Filled 11/06/22

## 2022-12-04 MED ORDER — OXYCODONE-ACETAMINOPHEN 5-325 MG PO TABS: 1.0000 | ORAL_TABLET | ORAL | 0 refills | Status: DC | PRN

## 2022-12-04 MED ORDER — ALPRAZOLAM 0.25 MG PO TABS: 0.2500 mg | ORAL_TABLET | Freq: Two times a day (BID) | ORAL | 0 refills | Status: DC | PRN

## 2022-12-04 MED ORDER — CYCLOBENZAPRINE HCL 10 MG PO TABS: 10.0000 mg | ORAL_TABLET | Freq: Three times a day (TID) | ORAL | 0 refills | Status: DC | PRN

## 2022-12-04 NOTE — Telephone Encounter (Signed)
Informed pt that Rx's has been sent in  

## 2022-12-05 ENCOUNTER — Encounter (INDEPENDENT_AMBULATORY_CARE_PROVIDER_SITE_OTHER): Payer: BC Managed Care – PPO | Admitting: Family Medicine

## 2022-12-05 DIAGNOSIS — H1033 Unspecified acute conjunctivitis, bilateral: Secondary | ICD-10-CM

## 2022-12-05 MED ORDER — POLYMYXIN B-TRIMETHOPRIM 10000-0.1 UNIT/ML-% OP SOLN: 2.0000 [drp] | Freq: Three times a day (TID) | OPHTHALMIC | 0 refills | Status: DC

## 2022-12-05 NOTE — Telephone Encounter (Signed)
[facility] VISIT   Patient agreed to [facility] visit and is aware that copayment and coinsurance may apply. Patient was treated using telemedicine according to accepted telemedicine protocols.  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 Use no longer than 7 days, Disp: 10 mL, Rfl: 0   ALPRAZolam (XANAX) 0.25 MG tablet, Take 1 tablet (0.25 mg total) by mouth 2 (two) times daily as needed for anxiety., Disp: 30 tablet, Rfl: 0   cyclobenzaprine (FLEXERIL) 10 MG tablet, Take 1 tablet (10 mg total) by mouth 3 (three) times daily as needed for muscle spasms., Disp: 30 tablet, Rfl: 0   EPINEPHrine 0.3 mg/0.3 mL IJ SOAJ injection, Inject 0.3 mLs (0.3 mg total) into the muscle once., Disp: 1 Device, Rfl: 2   hydrochlorothiazide (HYDRODIURIL) 12.5 MG tablet, Take 1 tablet (12.5 mg total) by mouth daily., Disp: 30 tablet, Rfl: 3   ondansetron (ZOFRAN) 4 MG tablet, Take 1 tablet (4 mg total) by mouth every 8 (eight) hours as needed for nausea or vomiting., Disp: 30 tablet, Rfl: 0    oxyCODONE-acetaminophen (PERCOCET/ROXICET) 5-325 MG tablet, Take 1 tablet by mouth every 4 (four) hours as needed., Disp: 30 tablet, Rfl: 0   vitamin C (ASCORBIC ACID) 500 MG tablet, Take 500 mg by mouth 2 (two) times daily., Disp: , Rfl:   Current Facility-Administered Medications:    0.9 %  sodium chloride infusion, 500 mL, Intravenous, Once, Danis, Kirke Corin, MD  Allergies  Allergen Reactions   Flovent [Fluticasone Propionate]     Difficulty breathing. No issues with Qvar Rxed by Dr Gwenette Greet   Codeine     REACTION: vomiting   Fluticasone    Fluticasone Furoate    Mushroom Extract Complex Swelling    Pt reports hand swelling when consuming mushrooms.    Other     Bee stings and pecan nuts     Assessment and Plan:   Diagnosis: conjunctivitis. Please see myChart communication and orders below.   No orders of the defined types were placed in this encounter.  Meds ordered this encounter  Medications   trimethoprim-polymyxin b (POLYTRIM) ophthalmic solution    Sig: Place 2 drops into both eyes 3 (three) times daily. Use no longer than 7 days    Dispense:  10 mL    Refill:  0    Annye Asa, MD 12/05/2022  A total of 8 minutes were spent by me to personally review the patient-generated inquiry, review patient records and data pertinent to assessment of the patient's problem, develop a management plan including  generation of prescriptions and/or orders, and on subsequent communication with the patient through secure the MyChart portal service.   There is no separately reported E/M service related to this service in the past 7 days nor does the patient have an upcoming soonest available appointment for this issue. This work was completed in less than 7 days.   The patient consented to this service today (see patient agreement prior to ongoing communication). Patient counseled regarding the need for in-person exam for certain conditions and was advised to call the office if any  changing or worsening symptoms occur.   The codes to be used for the E/M service are: [x]   99421 for 5-10 minutes of time spent on the inquiry. []   [id number] for 11-20 minutes. []   [id number] for 21+ minutes.

## 2022-12-25 ENCOUNTER — Other Ambulatory Visit: Payer: Self-pay | Admitting: Family Medicine

## 2023-01-01 ENCOUNTER — Other Ambulatory Visit: Payer: Self-pay | Admitting: Family Medicine

## 2023-01-01 DIAGNOSIS — F411 Generalized anxiety disorder: Secondary | ICD-10-CM

## 2023-01-01 DIAGNOSIS — M62838 Other muscle spasm: Secondary | ICD-10-CM

## 2023-01-01 MED ORDER — OXYCODONE-ACETAMINOPHEN 5-325 MG PO TABS: 1.0000 | ORAL_TABLET | ORAL | 0 refills | Status: DC | PRN

## 2023-01-01 MED ORDER — ALPRAZOLAM 0.25 MG PO TABS: 0.2500 mg | ORAL_TABLET | Freq: Two times a day (BID) | ORAL | 0 refills | Status: DC | PRN

## 2023-01-01 MED ORDER — CYCLOBENZAPRINE HCL 10 MG PO TABS: 10.0000 mg | ORAL_TABLET | Freq: Three times a day (TID) | ORAL | 0 refills | Status: DC | PRN

## 2023-01-01 MED ORDER — ONDANSETRON HCL 4 MG PO TABS: 4.0000 mg | ORAL_TABLET | Freq: Three times a day (TID) | ORAL | 0 refills | Status: DC | PRN

## 2023-01-01 NOTE — Telephone Encounter (Signed)
Pt aware that the Rx has been sent in  

## 2023-01-01 NOTE — Telephone Encounter (Signed)
Oxycodone 5-325 mg LOV: 10/02/22 Last Refill:12/04/22 Upcoming appt: no apt  Xanax 0.25 mg  Filled 12/04/22

## 2023-01-01 NOTE — Telephone Encounter (Signed)
Pt would like to know does Dr Birdie Riddle thinks she will benefit from a Rx for pink eye since pt is going to [place] ?

## 2023-01-06 ENCOUNTER — Other Ambulatory Visit: Payer: Self-pay | Admitting: Family Medicine

## 2023-01-06 DIAGNOSIS — Z1231 Encounter for screening mammogram for malignant neoplasm of breast: Secondary | ICD-10-CM

## 2023-01-17 ENCOUNTER — Telehealth: Payer: BC Managed Care – PPO | Admitting: Physician Assistant

## 2023-01-17 DIAGNOSIS — B9789 Other viral agents as the cause of diseases classified elsewhere: Secondary | ICD-10-CM

## 2023-01-17 DIAGNOSIS — J019 Acute sinusitis, unspecified: Secondary | ICD-10-CM | POA: Diagnosis not present

## 2023-01-17 MED ORDER — AZELASTINE HCL 0.1 % NA SOLN: 1.0000 | Freq: Two times a day (BID) | NASAL | 0 refills | Status: DC

## 2023-01-17 MED ORDER — AMOXICILLIN-POT CLAVULANATE 875-125 MG PO TABS: 1.0000 | ORAL_TABLET | Freq: Two times a day (BID) | ORAL | 0 refills | Status: DC

## 2023-01-17 NOTE — Progress Notes (Signed)
E-Visit for Sinus Problems  We are sorry that you are not feeling well.  Here is how we plan to help!  Based on what you have shared with me it looks like you have sinusitis.  Sinusitis is inflammation and infection in the sinus cavities of the head.  Based on your presentation I believe you most likely have Acute Viral Sinusitis.This is an infection most likely caused by a virus. There is not specific treatment for viral sinusitis other than to help you with the symptoms until the infection runs its course.  You may use an oral decongestant such as Mucinex D or if you have glaucoma or high blood pressure use plain Mucinex. Saline nasal spray help and can safely be used as often as needed for congestion, I have prescribed: Azelastine nasal spray 2 sprays in each nostril twice a day  Some authorities believe that zinc sprays or the use of Echinacea may shorten the course of your symptoms.  Sinus infections are not as easily transmitted as other respiratory infection, however we still recommend that you avoid close contact with loved ones, especially the very young and elderly.  Remember to wash your hands thoroughly throughout the day as this is the number one way to prevent the spread of infection!  Home Care: Only take medications as instructed by your medical team. Do not take these medications with alcohol. A steam or ultrasonic humidifier can help congestion.  You can place a towel over your head and breathe in the steam from hot water coming from a faucet. Avoid close contacts especially the very young and the elderly. Cover your mouth when you cough or sneeze. Always remember to wash your hands.  Get Help Right Away If: You develop worsening fever or sinus pain. You develop a severe head ache or visual changes. Your symptoms persist after you have completed your treatment plan.  Make sure you Understand these instructions. Will watch your condition. Will get help right away if you are  not doing well or get worse.   Thank you for choosing an e-visit.  Your e-visit answers were reviewed by a board certified advanced clinical practitioner to complete your personal care plan. Depending upon the condition, your plan could have included both over the counter or prescription medications.  Please review your pharmacy choice. Make sure the pharmacy is open so you can pick up prescription now. If there is a problem, you may contact your provider through [employer] and have the prescription routed to another pharmacy.  Your safety is important to [place]. If you have drug allergies check your prescription carefully.   For the next 24 hours you can use MyChart to ask questions about today's visit, request a non-urgent call back, or ask for a work or school excuse. You will get an email in the next two days asking about your experience. I hope that your e-visit has been valuable and will speed your recovery.  I have spent 5 minutes in review of e-visit questionnaire, review and updating patient chart, medical decision making and response to patient.   Mar Daring, PA-C

## 2023-01-17 NOTE — Addendum Note (Signed)
Addended by: Mar Daring on: 01/17/2023 11:18 AM   Modules accepted: Orders

## 2023-01-21 ENCOUNTER — Encounter: Payer: Self-pay | Admitting: Family Medicine

## 2023-01-27 ENCOUNTER — Encounter: Payer: Self-pay | Admitting: Family Medicine

## 2023-01-28 ENCOUNTER — Other Ambulatory Visit: Payer: Self-pay | Admitting: Family Medicine

## 2023-01-28 DIAGNOSIS — F411 Generalized anxiety disorder: Secondary | ICD-10-CM

## 2023-01-28 DIAGNOSIS — M62838 Other muscle spasm: Secondary | ICD-10-CM

## 2023-01-28 MED ORDER — OXYCODONE-ACETAMINOPHEN 5-325 MG PO TABS: 1.0000 | ORAL_TABLET | ORAL | 0 refills | Status: DC | PRN

## 2023-01-28 MED ORDER — ALPRAZOLAM 0.25 MG PO TABS: 0.2500 mg | ORAL_TABLET | Freq: Two times a day (BID) | ORAL | 0 refills | Status: DC | PRN

## 2023-01-28 MED ORDER — CYCLOBENZAPRINE HCL 10 MG PO TABS: 10.0000 mg | ORAL_TABLET | Freq: Three times a day (TID) | ORAL | 0 refills | Status: DC | PRN

## 2023-01-28 NOTE — Telephone Encounter (Signed)
Pt reports she is taking 2 different Abx for 2 different concerns, reports both Metronidazole 500 mg and Nitrofurantoin Mono/Mac 100 mg She is asking if 1 broad spectrum Abx would do the same for her or if there was any recommendations to avoid the upset stomach she feels she will having taking both Abx  Or should I defer back to prescriber at Dr Katina Degree office?

## 2023-01-28 NOTE — Telephone Encounter (Signed)
Left VM stating Rx has been sent in

## 2023-01-28 NOTE — Telephone Encounter (Signed)
Xanax 0.25 mg LOV: 10/02/22 Last Refill:01/01/23 Upcoming appt: none

## 2023-02-24 ENCOUNTER — Other Ambulatory Visit: Payer: Self-pay | Admitting: Family Medicine

## 2023-02-24 DIAGNOSIS — F411 Generalized anxiety disorder: Secondary | ICD-10-CM

## 2023-02-24 DIAGNOSIS — M62838 Other muscle spasm: Secondary | ICD-10-CM

## 2023-02-24 MED ORDER — ALPRAZOLAM 0.25 MG PO TABS: 0.2500 mg | ORAL_TABLET | Freq: Two times a day (BID) | ORAL | 0 refills | Status: DC | PRN

## 2023-02-24 MED ORDER — OXYCODONE-ACETAMINOPHEN 5-325 MG PO TABS: 1.0000 | ORAL_TABLET | ORAL | 0 refills | Status: DC | PRN

## 2023-02-24 MED ORDER — CYCLOBENZAPRINE HCL 10 MG PO TABS: 10.0000 mg | ORAL_TABLET | Freq: Three times a day (TID) | ORAL | 0 refills | Status: DC | PRN

## 2023-02-24 NOTE — Telephone Encounter (Signed)
  Xanax 0.25 mg LOV: 10/02/22 Last Refill:01/28/23 Upcoming appt: none  Oxycodone 5-325mg  Filled 01/28/23

## 2023-02-25 ENCOUNTER — Ambulatory Visit
Admission: RE | Admit: 2023-02-25 | Discharge: 2023-02-25 | Disposition: A | Discharge status: Home / Self Care | Payer: BC Managed Care – PPO | Source: Ambulatory Visit | Attending: Family Medicine | Admitting: Family Medicine

## 2023-02-25 DIAGNOSIS — Z1231 Encounter for screening mammogram for malignant neoplasm of breast: Secondary | ICD-10-CM

## 2023-03-10 ENCOUNTER — Encounter: Payer: Self-pay | Admitting: Family Medicine

## 2023-03-10 ENCOUNTER — Ambulatory Visit: Payer: BC Managed Care – PPO | Admitting: Family Medicine

## 2023-03-10 VITALS — BP 120/92 | HR 96 | Temp 98.1°F | Resp 16 | Ht 64.0 in | Wt 187.5 lb

## 2023-03-10 DIAGNOSIS — R6889 Other general symptoms and signs: Secondary | ICD-10-CM | POA: Diagnosis not present

## 2023-03-10 DIAGNOSIS — J329 Chronic sinusitis, unspecified: Secondary | ICD-10-CM | POA: Diagnosis not present

## 2023-03-10 DIAGNOSIS — B9689 Other specified bacterial agents as the cause of diseases classified elsewhere: Secondary | ICD-10-CM

## 2023-03-10 DIAGNOSIS — Z1152 Encounter for screening for COVID-19: Secondary | ICD-10-CM | POA: Diagnosis not present

## 2023-03-10 DIAGNOSIS — J029 Acute pharyngitis, unspecified: Secondary | ICD-10-CM | POA: Diagnosis not present

## 2023-03-10 LAB — POCT INFLUENZA A/B
Influenza A, POC: NEGATIVE
Influenza B, POC: NEGATIVE

## 2023-03-10 LAB — POCT RAPID STREP A (OFFICE): Rapid Strep A Screen: NEGATIVE

## 2023-03-10 LAB — POC COVID19 BINAXNOW: SARS Coronavirus 2 Ag: NEGATIVE

## 2023-03-10 MED ORDER — AMOXICILLIN 875 MG PO TABS: 875.0000 mg | ORAL_TABLET | Freq: Two times a day (BID) | ORAL | 0 refills | Status: AC

## 2023-03-10 NOTE — Patient Instructions (Signed)
Follow up as needed or as scheduled TAKE the Amoxicillin twice daily- take w/ food Drink LOTS of fluids REST! Tylenol/Ibuprofen as needed for headache, pain, fever Call with any questions or concerns Hang in there!!!

## 2023-03-10 NOTE — Progress Notes (Signed)
   Subjective:    Patient ID: Lynn Campos, female    DOB: 31-Mar-1971, 53 y.o.   MRN: [id number]  HPI Sore throat- pt reports sxs started last week w/ sore throat on Thursday had fever on Friday- 100.3  Started Amoxicillin 875mg  on Friday- 2 doses Friday, Saturday, Sunday, and 1 dose this morning.  Fever resolved.  Some sinus pressure.  Mild HA.  No ear pain.  + dry cough.  + fatigue, wheezing w/ exertion.  Taking Zyrtec daily   Review of Systems For ROS see HPI     Objective:   Physical Exam Constitutional:      General: She is not in acute distress.    Appearance: She is well-developed.  HENT:     Head: Normocephalic and atraumatic.     Right Ear: Tympanic membrane normal.     Left Ear: Tympanic membrane normal.     Nose: Mucosal edema and congestion present. No rhinorrhea.     Right Sinus: Maxillary sinus tenderness and frontal sinus tenderness present.     Left Sinus: Maxillary sinus tenderness and frontal sinus tenderness present.     Mouth/Throat:     Pharynx: Uvula midline. Posterior oropharyngeal erythema (w/ PND) present. No oropharyngeal exudate.  Eyes:     Conjunctiva/sclera: Conjunctivae normal.     Pupils: Pupils are equal, round, and reactive to light.  Cardiovascular:     Rate and Rhythm: Normal rate and regular rhythm.     Heart sounds: Normal heart sounds.  Pulmonary:     Effort: Pulmonary effort is normal. No respiratory distress.     Breath sounds: Normal breath sounds. No wheezing.  Musculoskeletal:     Cervical back: Normal range of motion and neck supple.  Lymphadenopathy:     Cervical: No cervical adenopathy.  Skin:    General: Skin is warm and dry.  Neurological:     General: No focal deficit present.     Mental Status: She is oriented to person, place, and time.     Cranial Nerves: No cranial nerve deficit.     Motor: No weakness.     Coordination: Coordination normal.  Psychiatric:        Mood and Affect: Mood normal.        Behavior: Behavior  normal.        Thought Content: Thought content normal.           Assessment & Plan:  Bacterial sinusitis- new.  Pt's sxs and PE consistent w/ infxn.  Likely started as allergies, then viral, and now bacterial.  Complete course of Amox as pt already started it.  Reviewed supportive care and red flags that should prompt return.  Pt expressed understanding and is in agreement w/ plan.

## 2023-03-19 ENCOUNTER — Telehealth: Payer: Self-pay | Admitting: Gastroenterology

## 2023-03-19 ENCOUNTER — Encounter: Payer: Self-pay | Admitting: Family Medicine

## 2023-03-19 NOTE — Telephone Encounter (Signed)
Patient called states she has been experiencing a lot of rectal bleeding and some pressure. She also mentioned having issues with hemorrhoids in the past not sure if it is related. 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 Seeking advise.

## 2023-03-19 NOTE — Telephone Encounter (Signed)
Returned call to patient. Pt reports that she first noticed the rectal bleeding on Sunday. Pt denies constipation or straining, she stated that "it is actually the opposite". Pt states that she notices the blood in her stool and in the commode. Pt states that the blood soaked through her pajamas and closed today at work. Pt reports a history of hemorrhoids, she has tried Preparation H cream and tucks pads since Sunday with minimal improvement. Patient states that it is BRB. Patient denies any rectal itching or burning. I told pt that it is possible that it is hemorrhoids, she has been advised to begin Preparation H suppositories BID for the next few days. Use wet wipes and pat area dry. Drink plenty of water. Pt will call with an update prior to the weekend. Pt is aware that she will need ED evaluation if she starts to pass blood only, or if she develops SOB, dizziness, or fatigue. Pt verbalized understanding and had no concerns at the end of the call.

## 2023-03-21 ENCOUNTER — Other Ambulatory Visit: Payer: Self-pay | Admitting: Family Medicine

## 2023-03-21 DIAGNOSIS — F411 Generalized anxiety disorder: Secondary | ICD-10-CM

## 2023-03-21 DIAGNOSIS — M62838 Other muscle spasm: Secondary | ICD-10-CM

## 2023-03-21 MED ORDER — ALPRAZOLAM 0.25 MG PO TABS
0.2500 mg | ORAL_TABLET | Freq: Two times a day (BID) | ORAL | 0 refills | Status: DC | PRN
Start: 2023-03-21 — End: 2023-04-24

## 2023-03-21 MED ORDER — CYCLOBENZAPRINE HCL 10 MG PO TABS
10.0000 mg | ORAL_TABLET | Freq: Three times a day (TID) | ORAL | 0 refills | Status: DC | PRN
Start: 2023-03-21 — End: 2023-04-24

## 2023-03-21 MED ORDER — OXYCODONE-ACETAMINOPHEN 5-325 MG PO TABS: 1.0000 | ORAL_TABLET | ORAL | 0 refills | Status: DC | PRN

## 2023-03-21 NOTE — Progress Notes (Unsigned)
03/24/2023 Lynn Campos [id number] Aug 18, 1971  Referring provider: Sheliah Hatch, MD Primary GI doctor: Dr. Myrtie Neither  ASSESSMENT AND PLAN:    Rectal bleeding 02/2021 colonoscopy unremarkable Most likely this represents internal hemorrhoids patient has a history of, also appreciated on exam, associate with worsening constipation with Zofran use for nausea. However has lower abdominal discomfort associate with nausea/vomiting since Feb, negative pelvic ultrasound. Uncertain if the rectal bleeding with the nausea and abdominal discomfort is related, however will schedule for CT abdomen pelvis to evaluate further rule out ischemic colitis, colitis, etc Start on MiraLAX, given hydrocortisone suppositories Will schedule follow-up with Dr. Myrtie Neither to evaluate for potential hemorrhoidal banding  Nausea and vomiting, with lower abdominal discomfort patient post cholecystectomy, denies reflux Continue Zofran as needed, will get CT abdomen pelvis to evaluate Will check labs  If negative consider endoscopic evaluation    Patient Care Team: Sheliah Hatch, MD as PCP - General Candice Camp, MD as Consulting Physician (Obstetrics and Gynecology)  HISTORY OF PRESENT ILLNESS: 52 y.o. female with a past medical history of HTN, lupus, GERD, hemorrhoids, and others listed below presents for evaluation of rectal bleeding and nausea.   Patient denies family history of colon cancer or other gastrointestinal malignancies. 09/21/2021 colonoscopy Dr. Myrtie Neither excellent prep entire colon was normal no specimens collected recall 10 years 03/19/2023 patient called stating she had rectal bleeding denies constipation or straining.  Noticed blood in stool and commode and then had blood soaked through her pajamas and close at work.   Tried Preparation H and Tucks pads.  Bright red blood.   Denies any rectal itching or burning.  She states she has had hemorrhoids since she had kids.  She has been able to  use witch hazel and preparation H and control them in the past.  She states she had 2 episodes rectal bleeding Wednesday. 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  She has had some rectal discomfort.  She was having AB pressure and pelvis pressure, during the rectal bleeding.  02/20 she saw her OB for lower AB cramping and nausea , given zofran at that time. Had pelvic US that was unremarkable. She continues to have nausea, can be severe and has been taking zofran and worsening constipation. Can be after eating or if her stomach is empty worse nausea. Has had some vomiting with it too and better after.  She denies GERD, had in the past but stopped sodas and this helped.  Started on the preparation H suppository. She had another episode of bleeding Saturday, she woke up with BRB in the bed.  Her Bm's are normal for her, she alternate between diarrhea and constipation, states more loose stools than constipation.   She denies blood thinner use.  She reports NSAID use, 800 mg ibuprofen every other day for neck pain She denies ETOH use.   She denies tobacco use.  She denies drug use.    She  reports that she has never smoked. She has never used smokeless tobacco. She reports current alcohol use. She reports that she does not use drugs.  RELEVANT LABS AND IMAGING: CBC    Component Value Date/Time   WBC 4.9 04/25/2022 1519   RBC 4.14 04/25/2022 1519   HGB 12.4 04/25/2022 1519   HCT 36.9 04/25/2022 1519   PLT 218.0 04/25/2022 1519   MCV 89.2 04/25/2022 1519  MCH 29.5 10/24/2019 1001   MCHC 33.5 04/25/2022 1519   RDW 13.3 04/25/2022 1519   LYMPHSABS 1.4 04/25/2022 1519   MONOABS 0.4 04/25/2022 1519   EOSABS 0.3 04/25/2022 1519   BASOSABS 0.1 04/25/2022 1519   Recent Labs    04/25/22 1519  HGB 12.4    CMP     Component Value Date/Time   NA 138 10/02/2022 1501   K 3.6 10/02/2022  1501   CL 99 10/02/2022 1501   CO2 31 10/02/2022 1501   GLUCOSE 90 10/02/2022 1501   BUN 13 10/02/2022 1501   CREATININE 0.70 10/02/2022 1501   CALCIUM 9.1 10/02/2022 1501   PROT 6.7 04/25/2022 1519   ALBUMIN 4.2 04/25/2022 1519   AST 16 04/25/2022 1519   ALT 21 04/25/2022 1519   ALKPHOS 50 04/25/2022 1519   BILITOT 0.3 04/25/2022 1519   GFRNONAA >60 10/24/2019 1001   GFRAA >60 10/24/2019 1001      Latest Ref Rng & Units 04/25/2022    3:19 PM 05/31/2021    8:55 AM 10/24/2019   10:01 AM  Hepatic Function  Total Protein 6.0 - 8.3 g/dL 6.7  6.7  6.8   Albumin 3.5 - 5.2 g/dL 4.2  4.3  4.0   AST 0 - 37 U/L 16  16  19    ALT 0 - 35 U/L 21  19  21    Alk Phosphatase 39 - 117 U/L 50  51  47   Total Bilirubin 0.2 - 1.2 mg/dL 0.3  0.3  0.5   Bilirubin, Direct 0.0 - 0.3 mg/dL 0.1  0.1        Current Medications:    Current Outpatient Medications (Cardiovascular):    EPINEPHrine 0.3 mg/0.3 mL IJ SOAJ injection, Inject 0.3 mLs (0.3 mg total) into the muscle once.   hydrochlorothiazide (HYDRODIURIL) 12.5 MG tablet, TAKE 1 TABLET(12.5 MG) BY MOUTH DAILY  Current Outpatient Medications (Respiratory):    azelastine (ASTELIN) 0.1 % nasal spray, Place 1 spray into both nostrils 2 (two) times daily. Use in each nostril as directed  Current Outpatient Medications (Analgesics):    oxyCODONE-acetaminophen (PERCOCET/ROXICET) 5-325 MG tablet, Take 1 tablet by mouth every 4 (four) hours as needed.   Current Outpatient Medications (Other):    ALPRAZolam (XANAX) 0.25 MG tablet, Take 1 tablet (0.25 mg total) by mouth 2 (two) times daily as needed for anxiety.   calcium carbonate (OSCAL) 1500 (600 Ca) MG TABS tablet, Take by mouth 2 (two) times daily with a meal.   cyclobenzaprine (FLEXERIL) 10 MG tablet, Take 1 tablet (10 mg total) by mouth 3 (three) times daily as needed for muscle spasms.   hydrocortisone (ANUSOL-HC) 25 MG suppository, Place 1 suppository (25 mg total) rectally 2 (two) times  daily.   ondansetron (ZOFRAN) 4 MG tablet, Take 1 tablet (4 mg total) by mouth every 8 (eight) hours as needed for nausea or vomiting.   UNABLE TO FIND, AG-1 drink mix   vitamin C (ASCORBIC ACID) 500 MG tablet, Take 500 mg by mouth 2 (two) times daily.  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 No issues with Qvar Rxed by Dr Shelle Iron   Codeine     REACTION: vomiting   Fluticasone    Fluticasone Furoate    Mushroom Extract Complex Swelling    Pt reports hand swelling when consuming mushrooms.    Other     Bee stings and pecan nuts      Surgical History:  She  has a past surgical history that includes Appendectomy; Cholecystectomy (05/2010); Cervical spine surgery (01/2010); Rotator cuff repair (Left); Sigmoidoscopy (2016); and Cervical ablation (2022). Family History:  Her family history includes Allergies in her sister; Asthma in her sister; Coronary artery disease in her father and mother; Hypertension in her mother; Rheum arthritis in her maternal grandmother and mother; Stroke in her mother.  REVIEW OF SYSTEMS  : All other systems reviewed and negative except where noted in the History of Present Illness.  PHYSICAL EXAM: BP 126/78   Pulse 82   Ht 5\' 4"  (1.626 m)   Wt 192 lb (87.1 kg)   SpO2 99%   BMI 32.96 kg/m  General Appearance: Well nourished, in no apparent distress. Head:   Normocephalic and atraumatic. Eyes:  sclerae anicteric,conjunctive pink  Respiratory: Respiratory effort normal, BS  equal bilaterally without rales, rhonchi, wheezing. Cardio: RRR with no MRGs. Peripheral pulses intact.  Abdomen: Soft,  Non-distended ,active bowel sounds. mild tenderness in the lower abdomen. Without guarding and Without rebound. No masses. Rectal: Normal external rectal exam, normal rectal tone, appreciated large, slightly prolapsing internal hemorrhoids, non-tender, no masses,  brown stool, hemoccult Positive Musculoskeletal: Full ROM, Normal gait. With edema. Skin:  Dry and intact without significant lesions or rashes Neuro: Alert and  oriented x4;  No focal deficits. Psych:  Cooperative. Normal mood and affect.    Doree Albee, PA-C 12:28 PM

## 2023-03-24 ENCOUNTER — Other Ambulatory Visit: Payer: Self-pay

## 2023-03-24 ENCOUNTER — Ambulatory Visit: Payer: BC Managed Care – PPO | Admitting: Physician Assistant

## 2023-03-24 ENCOUNTER — Other Ambulatory Visit (INDEPENDENT_AMBULATORY_CARE_PROVIDER_SITE_OTHER): Payer: BC Managed Care – PPO

## 2023-03-24 ENCOUNTER — Encounter: Payer: Self-pay | Admitting: Physician Assistant

## 2023-03-24 VITALS — BP 126/78 | HR 82 | Ht 64.0 in | Wt 192.0 lb

## 2023-03-24 DIAGNOSIS — R1030 Lower abdominal pain, unspecified: Secondary | ICD-10-CM

## 2023-03-24 DIAGNOSIS — K625 Hemorrhage of anus and rectum: Secondary | ICD-10-CM | POA: Diagnosis not present

## 2023-03-24 DIAGNOSIS — R1032 Left lower quadrant pain: Secondary | ICD-10-CM

## 2023-03-24 DIAGNOSIS — R112 Nausea with vomiting, unspecified: Secondary | ICD-10-CM

## 2023-03-24 LAB — CBC WITH DIFFERENTIAL/PLATELET
Basophils Absolute: 0.1 10*3/uL (ref 0.0–0.1)
Basophils Relative: 0.8 % (ref 0.0–3.0)
Eosinophils Absolute: 0.2 10*3/uL (ref 0.0–0.7)
Eosinophils Relative: 3.2 % (ref 0.0–5.0)
HCT: 37.3 % (ref 36.0–46.0)
Hemoglobin: 12.7 g/dL (ref 12.0–15.0)
Lymphocytes Relative: 23 % (ref 12.0–46.0)
Lymphs Abs: 1.6 10*3/uL (ref 0.7–4.0)
MCHC: 34 g/dL (ref 30.0–36.0)
MCV: 89.3 fl (ref 78.0–100.0)
Monocytes Absolute: 0.4 10*3/uL (ref 0.1–1.0)
Monocytes Relative: 5.9 % (ref 3.0–12.0)
Neutro Abs: 4.6 10*3/uL (ref 1.4–7.7)
Neutrophils Relative %: 67.1 % (ref 43.0–77.0)
Platelets: 276 10*3/uL (ref 150.0–400.0)
RBC: 4.18 Mil/uL (ref 3.87–5.11)
RDW: 13.8 % (ref 11.5–15.5)
WBC: 6.8 10*3/uL (ref 4.0–10.5)

## 2023-03-24 LAB — COMPREHENSIVE METABOLIC PANEL
ALT: 11 U/L (ref 0–35)
AST: 12 U/L (ref 0–37)
Albumin: 4.1 g/dL (ref 3.5–5.2)
Alkaline Phosphatase: 34 U/L — ABNORMAL LOW (ref 39–117)
BUN: 11 mg/dL (ref 6–23)
CO2: 28 mEq/L (ref 19–32)
Calcium: 8.9 mg/dL (ref 8.4–10.5)
Chloride: 102 mEq/L (ref 96–112)
Creatinine, Ser: 0.79 mg/dL (ref 0.40–1.20)
GFR: 85 mL/min (ref >60.00)
Glucose, Bld: 85 mg/dL (ref 70–99)
Potassium: 3.6 mEq/L (ref 3.5–5.1)
Sodium: 139 mEq/L (ref 135–145)
Total Bilirubin: 0.3 mg/dL (ref 0.2–1.2)
Total Protein: 6.9 g/dL (ref 6.0–8.3)

## 2023-03-24 LAB — LIPASE: Lipase: 5 U/L — ABNORMAL LOW (ref 11.0–59.0)

## 2023-03-24 LAB — CORTISOL: Cortisol, Plasma: 10.3 ug/dL

## 2023-03-24 MED ORDER — HYDROCORTISONE ACETATE 25 MG RE SUPP: 25.0000 mg | Freq: Two times a day (BID) | RECTAL | 0 refills | Status: DC

## 2023-03-24 NOTE — Progress Notes (Signed)
____________________________________________________________  Attending physician addendum:  Thank you for sending this case to me. I have reviewed the entire note and agree with the plan.  Sounds like internal hemorrhoidal bleeding and she should follow up with me for anoscopy and possible HR banding.  Amada Jupiter, MD  ____________________________________________________________

## 2023-03-24 NOTE — Patient Instructions (Signed)
You have been scheduled for a CT scan of the abdomen and pelvis at [facility], 1st floor Radiology. You are scheduled on 04/04/2023 at 7:30am. You should arrive 15 minutes prior to your appointment time for registration. Please follow the written instructions below on the day of your exam:   1) Do not eat anything after 4 hours prior to your test)   You may take any medications as prescribed with a small amount of water, if necessary. If you take any of the following medications: METFORMIN, GLUCOPHAGE, GLUCOVANCE, AVANDAMET, RIOMET, FORTAMET, ACTOPLUS MET, JANUMET, GLUMETZA or METAGLIP, you MAY be asked to HOLD this medication 48 hours AFTER the exam.   The purpose of you drinking the oral contrast is to aid in the visualization of your intestinal tract. The contrast solution may cause some diarrhea. Depending on your individual set of symptoms, you may also receive an intravenous injection of x-ray contrast/dye. Plan on being at [facility] for 45 minutes or longer, depending on the type of exam you are having performed.   If you have any questions regarding your exam or if you need to reschedule, you may call [facility] at [phone] between the hours of 8:00 am and 5:00 pm, Monday-Friday.     Your provider has requested that you go to the basement level for lab work before leaving today. Press "B" on the elevator. The lab is located at the first door on the left as you exit the elevator.  Please do sitz baths- these can be found at the pharmacy. It is a Chief Operating Officer that is put in your toliet.  Please increase fiber or add benefiber, increase water and increase acitivity.  Will send in hydrocoritsone suppository, cheapest with GOODRX from [employer], [employer], [employer] or [employer] if your insurance does not pay for it. If the hemorrhoid suppository sent in is too expensive you can do this over the counter trick.  Apply a pea size amount of over the counter Anusol HC cream  to the tip of an over the counter PrepH suppository and insert rectally once every night for at least 7 nights.  If this does not improve there are procedures that can be done.   About Hemorrhoids  Hemorrhoids are swollen veins in the lower rectum and anus.  Also called piles, hemorrhoids are a common problem.  Hemorrhoids may be internal (inside the rectum) or external (around the anus).  Internal Hemorrhoids  Internal hemorrhoids are often painless, but they rarely cause bleeding.  The internal veins may stretch and fall down (prolapse) through the anus to the outside of the body.  The veins may then become irritated and painful.  External Hemorrhoids  External hemorrhoids can be easily seen or felt around the anal opening.  They are under the skin around the anus.  When the swollen veins are scratched or broken by straining, rubbing or wiping they sometimes bleed.  How Hemorrhoids Occur  Veins in the rectum and around the anus tend to swell under pressure.  Hemorrhoids can result from increased pressure in the veins of your anus or rectum.  Some sources of pressure are:  Straining to have a bowel movement because of constipation Waiting too long to have a bowel movement Coughing and sneezing often Sitting for extended periods of time, including on the toilet Diarrhea Obesity Trauma or injury to the anus Some liver diseases Stress Family history of hemorrhoids Pregnancy  Pregnant women should try to avoid becoming constipated, because they  are more likely to have hemorrhoids during pregnancy.  In the last trimester of pregnancy, the enlarged uterus may press on blood vessels and causes hemorrhoids.  In addition, the strain of childbirth sometimes causes hemorrhoids after the birth.  Symptoms of Hemorrhoids  Some symptoms of hemorrhoids include: Swelling and/or a tender lump around the anus Itching, mild burning and bleeding around the anus Painful bowel movements with or  without constipation Bright red blood covering the stool, on toilet paper or in the toilet bowel.   Symptoms usually go away within a few days.  Always talk to your doctor about any bleeding to make sure it is not from some other causes.  Diagnosing and Treating Hemorrhoids  Diagnosis is made by an examination by your healthcare provider.  Special test can be performed by your doctor.    Most cases of hemorrhoids can be treated with: High-fiber diet: Eat more high-fiber foods, which help prevent constipation.  Ask for more detailed fiber information on types and sources of fiber from your healthcare provider. Fluids: Drink plenty of water.  This helps soften bowel movements so they are easier to pass. Sitz baths and cold packs: Sitting in lukewarm water two or three times a day for 15 minutes cleases the anal area and may relieve discomfort.  If the water is too hot, swelling around the anus will get worse.  Placing a cloth-covered ice pack on the anus for ten minutes four times a day can also help reduce selling.  Gently pushing a prolapsed hemorrhoid back inside after the bath or ice pack can be helpful. Medications: For mild discomfort, your healthcare provider may suggest over-the-counter pain medication or prescribe a cream or ointment for topical use.  The cream may contain witch hazel, zinc oxide or petroleum jelly.  Medicated suppositories are also a treatment option.  Always consult your doctor before applying medications or creams. Procedures and surgeries: There are also a number of procedures and surgeries to shrink or remove hemorrhoids in more serious cases.  Talk to your physician about these options.  You can often prevent hemorrhoids or keep them from becoming worse by maintaining a healthy lifestyle.  Eat a fiber-rich diet of fruits, vegetables and whole grains.  Also, drink plenty of water and exercise regularly.   2007, [employer]  Doc.30   _______________________________________________________  If your blood pressure at your visit was 140/90 or greater, please contact your primary care physician to follow up on this.  _______________________________________________________  If you are age 38 or older, your body mass index should be between 23-30. Your Body mass index is 32.96 kg/m. If this is out of the aforementioned range listed, please consider follow up with your Primary Care Provider.  If you are age 50 or younger, your body mass index should be between 19-25. Your Body mass index is 32.96 kg/m. If this is out of the aformentioned range listed, please consider follow up with your Primary Care Provider.   ________________________________________________________  The [facility] providers would like to encourage you to use [facility] to communicate with providers for non-urgent requests or questions.  Due to long hold times on the telephone, sending your provider a message by [facility] may be a faster and more efficient way to get a response.  Please allow 48 business hours for a response.  Please remember that this is for non-urgent requests.  _______________________________________________________ It was a pleasure to see you today!  Thank you for trusting me with your gastrointestinal care!

## 2023-03-25 ENCOUNTER — Encounter: Payer: Self-pay | Admitting: Family Medicine

## 2023-03-25 LAB — IGA: Immunoglobulin A: 144 mg/dL (ref 47–310)

## 2023-03-25 LAB — TISSUE TRANSGLUTAMINASE, IGA: (tTG) Ab, IgA: <1 U/mL

## 2023-03-26 ENCOUNTER — Other Ambulatory Visit: Payer: Self-pay | Admitting: Family Medicine

## 2023-03-26 ENCOUNTER — Telehealth: Payer: Self-pay | Admitting: Family Medicine

## 2023-03-26 NOTE — Telephone Encounter (Signed)
I spoke to patient and let her know that I would give the dx code to the pharmacy. Called pharmacy and gave them the info, they stated it was going through and they would fill it

## 2023-03-26 NOTE — Telephone Encounter (Signed)
Encourage patient to contact the pharmacy for refills or they can request refills through [facility]   WHAT PHARMACY WOULD THEY LIKE THIS SENT TO:  [id number] - [place],  - [address]    MEDICATION NAME & DOSE: oxyCODONE-acetaminophen oxyCODONE-acetaminophen (PERCOCET/ROXICET) 5-325 MG tablet  NOTES/COMMENTS FROM PATIENT:      Front office please notify patient: It takes 48-72 hours to process rx refill requests Ask patient to call pharmacy to ensure rx is ready before heading there.

## 2023-03-27 MED ORDER — OXYCODONE-ACETAMINOPHEN 5-325 MG PO TABS: 1.0000 | ORAL_TABLET | ORAL | 0 refills | Status: DC | PRN

## 2023-04-04 ENCOUNTER — Ambulatory Visit (HOSPITAL_COMMUNITY)
Admission: RE | Admit: 2023-04-04 | Discharge: 2023-04-04 | Disposition: A | Discharge status: Home / Self Care | Payer: BC Managed Care – PPO | Source: Ambulatory Visit | Attending: Physician Assistant | Admitting: Physician Assistant

## 2023-04-04 DIAGNOSIS — K625 Hemorrhage of anus and rectum: Secondary | ICD-10-CM | POA: Diagnosis present

## 2023-04-04 DIAGNOSIS — R112 Nausea with vomiting, unspecified: Secondary | ICD-10-CM | POA: Diagnosis present

## 2023-04-04 DIAGNOSIS — R1032 Left lower quadrant pain: Secondary | ICD-10-CM | POA: Diagnosis present

## 2023-04-04 DIAGNOSIS — R1030 Lower abdominal pain, unspecified: Secondary | ICD-10-CM | POA: Diagnosis present

## 2023-04-04 MED ORDER — IOHEXOL 300 MG/ML  SOLN
100.0000 mL | Freq: Once | INTRAMUSCULAR | Status: AC | PRN
  Administered 2023-04-04: 100 mL via INTRAVENOUS

## 2023-04-04 MED ORDER — IOHEXOL 9 MG/ML PO SOLN
ORAL | Status: AC
  Filled 2023-04-04: qty 1000

## 2023-04-04 MED ORDER — IOHEXOL 9 MG/ML PO SOLN
500.0000 mL | ORAL | Status: AC
  Administered 2023-04-04 (×2): 500 mL via ORAL

## 2023-04-04 MED ORDER — SODIUM CHLORIDE (PF) 0.9 % IJ SOLN
INTRAMUSCULAR | Status: AC
  Filled 2023-04-04: qty 50

## 2023-04-23 ENCOUNTER — Other Ambulatory Visit: Payer: Self-pay | Admitting: Family Medicine

## 2023-04-24 ENCOUNTER — Other Ambulatory Visit: Payer: Self-pay | Admitting: Family Medicine

## 2023-04-24 DIAGNOSIS — F411 Generalized anxiety disorder: Secondary | ICD-10-CM

## 2023-04-24 DIAGNOSIS — M62838 Other muscle spasm: Secondary | ICD-10-CM

## 2023-04-24 MED ORDER — CYCLOBENZAPRINE HCL 10 MG PO TABS
10.0000 mg | ORAL_TABLET | Freq: Three times a day (TID) | ORAL | 0 refills | Status: DC | PRN
Start: 2023-04-24 — End: 2023-05-21

## 2023-04-24 MED ORDER — OXYCODONE-ACETAMINOPHEN 5-325 MG PO TABS: 1.0000 | ORAL_TABLET | ORAL | 0 refills | Status: DC | PRN

## 2023-04-24 MED ORDER — ALPRAZOLAM 0.25 MG PO TABS
0.2500 mg | ORAL_TABLET | Freq: Two times a day (BID) | ORAL | 0 refills | Status: DC | PRN
Start: 2023-04-24 — End: 2023-05-21

## 2023-04-24 NOTE — Telephone Encounter (Signed)
Oxycodone 5-325 mg  LOV: 03/10/23 Last Refill:03/27/23 Upcoming appt: none Xanax 0. 25 mg  Filled 03/21/23 [facility] [place]

## 2023-04-24 NOTE — Telephone Encounter (Signed)
Pt aware of the refill

## 2023-04-24 NOTE — Telephone Encounter (Signed)
Pt aware of refill.

## 2023-04-24 NOTE — Telephone Encounter (Signed)
They duplicated these Refills for some reason . The Xanax is attached to the one with the Oxycodone can you refill the Flexeril ?

## 2023-05-05 ENCOUNTER — Encounter: Payer: Self-pay | Admitting: Gastroenterology

## 2023-05-05 ENCOUNTER — Telehealth: Payer: Self-pay

## 2023-05-05 ENCOUNTER — Ambulatory Visit (AMBULATORY_SURGERY_CENTER): Payer: BC Managed Care – PPO

## 2023-05-05 VITALS — Ht 64.0 in | Wt 190.0 lb

## 2023-05-05 DIAGNOSIS — K625 Hemorrhage of anus and rectum: Secondary | ICD-10-CM

## 2023-05-05 DIAGNOSIS — R1032 Left lower quadrant pain: Secondary | ICD-10-CM

## 2023-05-05 DIAGNOSIS — R112 Nausea with vomiting, unspecified: Secondary | ICD-10-CM

## 2023-05-05 NOTE — Telephone Encounter (Addendum)
Hey Dr. Myrtie Neither,   I am reaching out on the behalf of the patient. She is questioning if she does not need to be scheduled for an upper endo in addition to her colon. Pt is c/o of severe n/v and diarrhea since February. She wants to make sure she gets everything done at one time please advise? I see she had a OV on 03/24/23 but no mention of needing a upper.   Thank you

## 2023-05-05 NOTE — Telephone Encounter (Signed)
Since those symptoms have been persistent, she can have an upper endoscopy at the same time.  Fortunately, the procedure slot after her is currently open, so her appointment can be changed to an EGD and colonoscopy.  Please make a change in the [facility] schedule for that day in order to hold the extra slot for her.  HD

## 2023-05-05 NOTE — Progress Notes (Signed)
No egg or soy allergy known to patient  No issues known to pt with past sedation with any surgeries or procedures  Patient denies ever being told they had issues or difficulty with intubation  No FH of Malignant Hyperthermia Pt is not on diet pills Pt is not on  home 02  Pt is not on blood thinners  Pt denies issues with constipation  No A fib or A flutter Have any cardiac testing pending-- no  Pt is ambulatory    PV completed. Pt unable to tolerate preparation for last colonoscopy. Miralax prep given. Pt to purchase all products OTC. Prep instructions reviewed and provided via mychart / home address.      Patient's chart reviewed by Cathlyn Parsons CNRA prior to previsit and patient appropriate for the [facility].  Previsit completed and red dot placed by patient's name on their procedure day (on provider's schedule).

## 2023-05-05 NOTE — Addendum Note (Signed)
Addended by: Darylene Price on: 05/05/2023 01:40 PM   Modules accepted: Orders

## 2023-05-05 NOTE — Telephone Encounter (Signed)
Updates made on the schedule pt made aware

## 2023-05-11 ENCOUNTER — Encounter: Payer: Self-pay | Admitting: Certified Registered Nurse Anesthetist

## 2023-05-13 ENCOUNTER — Telehealth: Payer: Self-pay | Admitting: Physician Assistant

## 2023-05-13 DIAGNOSIS — A09 Infectious gastroenteritis and colitis, unspecified: Secondary | ICD-10-CM

## 2023-05-13 NOTE — Telephone Encounter (Signed)
PT has had severe diarrhea since Sunday and  wants to know what she can and cannot take to help with symptoms  before her colonoscopy/egd on 6/20

## 2023-05-13 NOTE — Telephone Encounter (Signed)
Patient called and asked the frequency of diarrhea and for how long. Patient states the diarrhea started on Sunday, 05/13/23 with a total of 8-10 diarrhea stools with abdominal pain, a pain scale of 7. Monday a total of 6-8 diarrhea stools and today with a total of 4 diarrhea stools with a pain level of 1-2 at present. Patient is also concerned about the scheduled colonoscopy scheduled for Thursday. She states she is supposed to have clear liquids to help with prep tomorrow, what is she to do? Patient denies fever and chills.

## 2023-05-14 ENCOUNTER — Encounter: Payer: Self-pay | Admitting: Physician Assistant

## 2023-05-14 ENCOUNTER — Other Ambulatory Visit (INDEPENDENT_AMBULATORY_CARE_PROVIDER_SITE_OTHER): Payer: BC Managed Care – PPO

## 2023-05-14 ENCOUNTER — Ambulatory Visit (INDEPENDENT_AMBULATORY_CARE_PROVIDER_SITE_OTHER): Payer: BC Managed Care – PPO | Admitting: Physician Assistant

## 2023-05-14 ENCOUNTER — Telehealth: Payer: Self-pay | Admitting: Physician Assistant

## 2023-05-14 VITALS — BP 130/80 | HR 86 | Ht 64.0 in | Wt 185.0 lb

## 2023-05-14 DIAGNOSIS — K219 Gastro-esophageal reflux disease without esophagitis: Secondary | ICD-10-CM

## 2023-05-14 DIAGNOSIS — R112 Nausea with vomiting, unspecified: Secondary | ICD-10-CM

## 2023-05-14 DIAGNOSIS — A09 Infectious gastroenteritis and colitis, unspecified: Secondary | ICD-10-CM

## 2023-05-14 DIAGNOSIS — K625 Hemorrhage of anus and rectum: Secondary | ICD-10-CM

## 2023-05-14 LAB — CBC WITH DIFFERENTIAL/PLATELET
Basophils Absolute: 0.1 10*3/uL (ref 0.0–0.1)
Basophils Relative: 0.8 % (ref 0.0–3.0)
Eosinophils Absolute: 0.1 10*3/uL (ref 0.0–0.7)
Eosinophils Relative: 1.8 % (ref 0.0–5.0)
HCT: 42.2 % (ref 36.0–46.0)
Hemoglobin: 14 g/dL (ref 12.0–15.0)
Lymphocytes Relative: 17.2 % (ref 12.0–46.0)
Lymphs Abs: 1 10*3/uL (ref 0.7–4.0)
MCHC: 33.2 g/dL (ref 30.0–36.0)
MCV: 89.6 fl (ref 78.0–100.0)
Monocytes Absolute: 0.3 10*3/uL (ref 0.1–1.0)
Monocytes Relative: 5.8 % (ref 3.0–12.0)
Neutro Abs: 4.4 10*3/uL (ref 1.4–7.7)
Neutrophils Relative %: 74.4 % (ref 43.0–77.0)
Platelets: 269 10*3/uL (ref 150.0–400.0)
RBC: 4.72 Mil/uL (ref 3.87–5.11)
RDW: 13.7 % (ref 11.5–15.5)
WBC: 5.9 10*3/uL (ref 4.0–10.5)

## 2023-05-14 LAB — COMPREHENSIVE METABOLIC PANEL
ALT: 10 U/L (ref 0–35)
AST: 12 U/L (ref 0–37)
Albumin: 4.5 g/dL (ref 3.5–5.2)
Alkaline Phosphatase: 31 U/L — ABNORMAL LOW (ref 39–117)
BUN: 11 mg/dL (ref 6–23)
CO2: 28 mEq/L (ref 19–32)
Calcium: 9.2 mg/dL (ref 8.4–10.5)
Chloride: 101 mEq/L (ref 96–112)
Creatinine, Ser: 0.85 mg/dL (ref 0.40–1.20)
GFR: 77.77 mL/min (ref >60.00)
Glucose, Bld: 105 mg/dL — ABNORMAL HIGH (ref 70–99)
Potassium: 4.1 mEq/L (ref 3.5–5.1)
Sodium: 137 mEq/L (ref 135–145)
Total Bilirubin: 0.5 mg/dL (ref 0.2–1.2)
Total Protein: 7.7 g/dL (ref 6.0–8.3)

## 2023-05-14 LAB — HIGH SENSITIVITY CRP: CRP, High Sensitivity: 7.39 mg/L — ABNORMAL HIGH (ref 0.000–5.000)

## 2023-05-14 LAB — SEDIMENTATION RATE: Sed Rate: 12 mm/hr (ref 0–30)

## 2023-05-14 NOTE — Patient Instructions (Addendum)
  Your provider has requested that you go to the basement level for lab work before leaving today. Press "B" on the elevator. The lab is located at the first door on the left as you exit the elevator.  _______________________________________________________  If your blood pressure at your visit was 140/90 or greater, please contact your primary care physician to follow up on this.  _______________________________________________________  If you are age 54 or older, your body mass index should be between 23-30. Your Body mass index is 31.76 kg/m. If this is out of the aforementioned range listed, please consider follow up with your Primary Care Provider.  If you are age 20 or younger, your body mass index should be between 19-25. Your Body mass index is 31.76 kg/m. If this is out of the aformentioned range listed, please consider follow up with your Primary Care Provider.   ________________________________________________________  The [facility] providers would like to encourage you to use [facility] to communicate with providers for non-urgent requests or questions.  Due to long hold times on the telephone, sending your provider a message by [facility] may be a faster and more efficient way to get a response.  Please allow 48 business hours for a response.  Please remember that this is for non-urgent requests.  _______________________________________________________ It was a pleasure to see you today!  Thank you for trusting me with your gastrointestinal care!

## 2023-05-14 NOTE — Telephone Encounter (Signed)
Inbound call from patient requesting a call back . She said she want to know why the appt for 06/17/2023 got cancel without  notified her. Also she said she was supposed to have an appt tomorrow as well and she want to know what's going on.? Please advise

## 2023-05-14 NOTE — Telephone Encounter (Signed)
Patient seen at 8:15 am today. Colonoscopy cancelled pending C-Diff test results.

## 2023-05-14 NOTE — Progress Notes (Signed)
05/14/2023 Lynn Campos [id number] 1971/01/12  Referring provider: Sheliah Hatch, MD Primary GI doctor: Dr. Myrtie Neither  ASSESSMENT AND PLAN:    Rectal bleeding and change in bowel habits Worsening diarrhea x Sunday, some weight loss since April, scheduled for colonoscopy tomorrow but will cancel while awaiting infectious labs. Slightly abnormal CT in May, uncertain if peristalsis or true inflammation Will get CBC, CMET, sed rate, CRP Diatherix GI profile and Cdiff today in the office If negative will plan on scheduling colon and EGD with first available We have discussed the risks of bleeding, infection, perforation, medication reactions, and remote risk of death associated with colonoscopy. All questions were answered and the patient acknowledges these risk and wishes to proceed.  Nausea and vomiting, weight loss post cholecystectomy, denies reflux Will plan on EGD with colonoscopy to evaluate for gastritis, celiac, etc.  Will schedule EGD. I discussed risks of EGD with patient today, including risk of sedation, bleeding or perforation.  Patient provides understanding and gave verbal consent to proceed.   Patient Care Team: Sheliah Hatch, MD as PCP - General Candice Camp, MD as Consulting Physician (Obstetrics and Gynecology)  HISTORY OF PRESENT ILLNESS: 52 y.o. female with a past medical history of HTN, lupus, GERD, hemorrhoids, and others listed below presents for evaluation of rectal bleeding and nausea.   Patient denies family history of colon cancer or other gastrointestinal malignancies. 09/21/2021 colonoscopy Dr. Myrtie Neither excellent prep entire colon was normal no specimens collected recall 10 years 03/19/2023 patient called stating she had rectal bleeding denies constipation or straining.  Noticed blood in stool and commode and then had blood soaked through her pajamas and close at work.   Tried Preparation H and Tucks pads.  Bright red blood.   Denies any rectal  itching or burning. 04/04/2023 CT AB and pelvis with contrast Thickened mucosa with narrowed lumen in the sigmoid colon. This might be related to normal peristalsis, but a mucosal process is not excluded, and endoscopic correlation should be considered.  She is having small rectal bleeding on TP only, not like when she first was here in April.  She states prior to this she was having normal formed stools but would have episodic episodes of diarrhea but nothing as bad as what started Sunday.  She started with diarrhea Sunday, was having close to 12 times, Monday 8-10 times and yesterday had a little bit less but continues to have loose stools. Can be worse after eating with immediate need to have BM.  She is have AB discomfort, RLQ quadrant pain occ before BM, can get better after BM. Today she mainly feels bloating.  She is having nausea at time, she has some mild upper AB pain and would like an endoscopy too.  She is very frustrated with her intermittent symptoms, affects quality of life.  No chest pain, SOB, no fever, chills.  Has had some weight loss since April, about 7 lbs.  Wt Readings from Last 3 Encounters:  05/14/23 185 lb (83.9 kg)  05/05/23 190 lb (86.2 kg)  03/24/23 192 lb (87.1 kg)   She denies blood thinner use.  She reports NSAID use, 800 mg ibuprofen every other day for neck pain She denies ETOH use.   She denies tobacco use.  She denies drug use.    She  reports that she has never smoked. She has never used smokeless tobacco. She reports current alcohol use. She reports that she does not use drugs.  RELEVANT LABS AND  IMAGING: CBC    Component Value Date/Time   WBC 6.8 03/24/2023 1209   RBC 4.18 03/24/2023 1209   HGB 12.7 03/24/2023 1209   HCT 37.3 03/24/2023 1209   PLT 276.0 03/24/2023 1209   MCV 89.3 03/24/2023 1209   MCH 29.5 10/24/2019 1001   MCHC 34.0 03/24/2023 1209   RDW 13.8 03/24/2023 1209   LYMPHSABS 1.6 03/24/2023 1209   MONOABS 0.4 03/24/2023 1209    EOSABS 0.2 03/24/2023 1209   BASOSABS 0.1 03/24/2023 1209   Recent Labs    03/24/23 1209  HGB 12.7    CMP     Component Value Date/Time   NA 139 03/24/2023 1209   K 3.6 03/24/2023 1209   CL 102 03/24/2023 1209   CO2 28 03/24/2023 1209   GLUCOSE 85 03/24/2023 1209   BUN 11 03/24/2023 1209   CREATININE 0.79 03/24/2023 1209   CALCIUM 8.9 03/24/2023 1209   PROT 6.9 03/24/2023 1209   ALBUMIN 4.1 03/24/2023 1209   AST 12 03/24/2023 1209   ALT 11 03/24/2023 1209   ALKPHOS 34 (L) 03/24/2023 1209   BILITOT 0.3 03/24/2023 1209   GFRNONAA >60 10/24/2019 1001   GFRAA >60 10/24/2019 1001      Latest Ref Rng & Units 03/24/2023   12:09 PM 04/25/2022    3:19 PM 05/31/2021    8:55 AM  Hepatic Function  Total Protein 6.0 - 8.3 g/dL 6.9  6.7  6.7   Albumin 3.5 - 5.2 g/dL 4.1  4.2  4.3   AST 0 - 37 U/L 12  16  16    ALT 0 - 35 U/L 11  21  19    Alk Phosphatase 39 - 117 U/L 34  50  51   Total Bilirubin 0.2 - 1.2 mg/dL 0.3  0.3  0.3   Bilirubin, Direct 0.0 - 0.3 mg/dL  0.1  0.1       Current Medications:   Current Outpatient Medications (Endocrine & Metabolic):    LO LOESTRIN FE 1 MG-10 MCG / 10 MCG tablet, Take 1 tablet by mouth daily.  Current Outpatient Medications (Cardiovascular):    EPINEPHrine 0.3 mg/0.3 mL IJ SOAJ injection, Inject 0.3 mLs (0.3 mg total) into the muscle once.   hydrochlorothiazide (HYDRODIURIL) 12.5 MG tablet, TAKE 1 TABLET(12.5 MG) BY MOUTH DAILY  Current Outpatient Medications (Respiratory):    azelastine (ASTELIN) 0.1 % nasal spray, Place 1 spray into both nostrils 2 (two) times daily. Use in each nostril as directed  Current Outpatient Medications (Analgesics):    oxyCODONE-acetaminophen (PERCOCET/ROXICET) 5-325 MG tablet, Take 1 tablet by mouth every 4 (four) hours as needed. Dx M54.2   Current Outpatient Medications (Other):    ALPRAZolam (XANAX) 0.25 MG tablet, Take 1 tablet (0.25 mg total) by mouth 2 (two) times daily as needed for anxiety.    calcium carbonate (OSCAL) 1500 (600 Ca) MG TABS tablet, Take by mouth 2 (two) times daily with a meal.   cyclobenzaprine (FLEXERIL) 10 MG tablet, Take 1 tablet (10 mg total) by mouth 3 (three) times daily as needed for muscle spasms.   hydrocortisone (ANUSOL-HC) 25 MG suppository, Place 1 suppository (25 mg total) rectally 2 (two) times daily.   ondansetron (ZOFRAN) 4 MG tablet, Take 1 tablet (4 mg total) by mouth every 8 (eight) hours as needed for nausea or vomiting.   UNABLE TO FIND, AG-1 drink mix   vitamin C (ASCORBIC ACID) 500 MG tablet, Take 500 mg by mouth 2 (two) times daily.  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 No issues with Qvar Rxed by Dr Shelle Iron   Codeine     REACTION: vomiting   Fluticasone    Fluticasone Furoate    Mushroom Extract Complex Swelling    Pt reports hand swelling when consuming mushrooms.    Other     Bee stings and pecan nuts      Surgical History:  She  has a past surgical history that includes Appendectomy; Cholecystectomy (05/2010); Cervical spine surgery (01/2010); Rotator cuff repair (Left); Sigmoidoscopy (2016); and Cervical ablation (2022). Family History:  Her family history includes Allergies in her sister; Asthma in her sister; Colon polyps in her brother, brother, and brother; Coronary artery disease in her father and mother; Hypertension in her mother; Rheum arthritis in her maternal grandmother and mother; Stroke in her mother.  REVIEW OF  SYSTEMS  : All other systems reviewed and negative except where noted in the History of Present Illness.  PHYSICAL EXAM: BP 130/80   Pulse 86   Ht 5\' 4"  (1.626 m)   Wt 185 lb (83.9 kg)   BMI 31.76 kg/m  General Appearance: Well nourished, in no apparent distress. Head:   Normocephalic and atraumatic. Eyes:  sclerae anicteric,conjunctive pink  Respiratory: Respiratory effort normal, BS equal bilaterally without rales, rhonchi, wheezing. Cardio: RRR with no MRGs. Peripheral pulses intact.  Abdomen: Soft,  Non-distended ,active bowel sounds. mild tenderness in the lower abdomen. Without guarding and Without rebound. No masses. Rectal: Normal external rectal exam, normal rectal tone, appreciated large, slightly prolapsing internal hemorrhoids, non-tender, no masses,  brown stool, hemoccult Positive Musculoskeletal: Full ROM, Normal gait. With edema. Skin:  Dry and intact without significant lesions or rashes Neuro: Alert and  oriented x4;  No focal deficits. Psych:  Cooperative. Normal mood and affect.    Doree Albee, PA-C 8:26 AM

## 2023-05-15 ENCOUNTER — Encounter: Payer: BC Managed Care – PPO | Admitting: Gastroenterology

## 2023-05-15 NOTE — Progress Notes (Signed)
____________________________________________________________  Attending physician addendum:  Thank you for sending this case to me and for talking with me about it yesterday and again today. I have reviewed the entire note and agree with the plan with the following change:  We should have the C. difficile test result no later than 05/19/2023.  If negative, my preference is for this patient to have a colonoscopy within the next 2 weeks, which would have to be with one of the other docs that has an open slot.  I am out of the office all next week and my 2 endoscopy sessions the following week are both full.  I understand the upper digestive symptoms warrant EGD and agree with that, however a double procedure slot might not be available for several more weeks, which is longer than I am comfortable waiting on the symptoms.  I think it is more pressing to perform the colonoscopy by the week after next, even if the upper endoscopy needs to wait for another endoscopy slot within 2-3 weeks after that.  Please check on the stool test result early next week (if it does not result sooner).  Amada Jupiter, MD  ____________________________________________________________

## 2023-05-16 ENCOUNTER — Telehealth: Payer: Self-pay | Admitting: Physician Assistant

## 2023-05-16 ENCOUNTER — Encounter: Payer: Self-pay | Admitting: Internal Medicine

## 2023-05-16 ENCOUNTER — Other Ambulatory Visit: Payer: Self-pay

## 2023-05-16 DIAGNOSIS — Z1211 Encounter for screening for malignant neoplasm of colon: Secondary | ICD-10-CM

## 2023-05-16 MED ORDER — DICYCLOMINE HCL 20 MG PO TABS: 20.0000 mg | ORAL_TABLET | Freq: Three times a day (TID) | ORAL | 0 refills | Status: DC | PRN

## 2023-05-16 MED ORDER — NA SULFATE-K SULFATE-MG SULF 17.5-3.13-1.6 GM/177ML PO SOLN: 1.0000 | Freq: Once | ORAL | 0 refills | Status: AC

## 2023-05-16 NOTE — Telephone Encounter (Signed)
Thanks for working on that and for the update.  - HD

## 2023-05-16 NOTE — Telephone Encounter (Signed)
Negative diatherix.  Discussed with patient and with her symptoms will get her into first available for colonoscopy only to not delay the procedure.  Can consider EGD later if still having symptoms.  Will send in dicyclomine for the patient.

## 2023-05-20 ENCOUNTER — Encounter: Payer: Self-pay | Admitting: Internal Medicine

## 2023-05-20 ENCOUNTER — Ambulatory Visit (AMBULATORY_SURGERY_CENTER): Payer: BC Managed Care – PPO | Admitting: Internal Medicine

## 2023-05-20 VITALS — BP 147/72 | HR 78 | Temp 97.5°F | Resp 14 | Ht 64.0 in | Wt 190.0 lb

## 2023-05-20 DIAGNOSIS — K649 Unspecified hemorrhoids: Secondary | ICD-10-CM

## 2023-05-20 DIAGNOSIS — K625 Hemorrhage of anus and rectum: Secondary | ICD-10-CM | POA: Diagnosis not present

## 2023-05-20 DIAGNOSIS — R112 Nausea with vomiting, unspecified: Secondary | ICD-10-CM

## 2023-05-20 DIAGNOSIS — K635 Polyp of colon: Secondary | ICD-10-CM | POA: Diagnosis not present

## 2023-05-20 DIAGNOSIS — D125 Benign neoplasm of sigmoid colon: Secondary | ICD-10-CM | POA: Diagnosis not present

## 2023-05-20 DIAGNOSIS — D122 Benign neoplasm of ascending colon: Secondary | ICD-10-CM

## 2023-05-20 DIAGNOSIS — R197 Diarrhea, unspecified: Secondary | ICD-10-CM

## 2023-05-20 MED ORDER — SODIUM CHLORIDE 0.9 % IV SOLN
500.0000 mL | Freq: Once | INTRAVENOUS | Status: DC
Start: 2023-05-20 — End: 2023-05-20

## 2023-05-20 MED ORDER — ONDANSETRON 8 MG PO TBDP
8.0000 mg | ORAL_TABLET | Freq: Three times a day (TID) | ORAL | 1 refills | Status: AC | PRN
Start: 2023-05-20 — End: ?

## 2023-05-20 MED ORDER — LOPERAMIDE HCL 2 MG PO TABS: 2.0000 mg | ORAL_TABLET | Freq: Four times a day (QID) | ORAL | 2 refills | Status: AC | PRN

## 2023-05-20 MED ORDER — HYDROCORTISONE (PERIANAL) 2.5 % EX CREA
1.0000 | TOPICAL_CREAM | Freq: Two times a day (BID) | CUTANEOUS | 0 refills | Status: AC
Start: 2023-05-20 — End: 2023-05-27

## 2023-05-20 NOTE — Progress Notes (Signed)
GASTROENTEROLOGY PROCEDURE H&P NOTE   Primary Care Physician: Sheliah Hatch, MD    Reason for Procedure:   Rectal bleeding, changes in bowel habits  Plan:    Colonoscopy  Patient is appropriate for endoscopic procedure(s) in the ambulatory ([facility]) setting.  The nature of the procedure, as well as the risks, benefits, and alternatives were carefully and thoroughly reviewed with the patient. Ample time for discussion and questions allowed. The patient understood, was satisfied, and agreed to proceed.     HPI: Lynn Campos is a 52 y.o. female who presents for colonoscopy for evaluation of rectal bleeding and change sin bowel habits .  Patient was most recently seen in the Gastroenterology Clinic on 05/14/23.  No interval change in medical history since that appointment. Please refer to that note for full details regarding GI history and clinical presentation.   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 Juanda Chance    Prior to Admission medications   Medication Sig Start Date End Date Taking? Authorizing Provider  ALPRAZolam (XANAX) 0.25 MG tablet Take 1 tablet (0.25 mg total) by mouth 2 (two) times daily as needed for anxiety. 04/24/23  Yes Sheliah Hatch, MD  cyclobenzaprine (FLEXERIL) 10 MG tablet Take 1 tablet (10 mg total) by mouth 3 (three)  times daily as needed for muscle spasms. 04/24/23  Yes Sheliah Hatch, MD  dicyclomine (BENTYL) 20 MG tablet Take 1 tablet (20 mg total) by mouth 3 (three) times daily as needed for spasms. 05/16/23  Yes Quentin Mulling R, PA-C  hydrochlorothiazide (HYDRODIURIL) 12.5 MG tablet TAKE 1 TABLET(12.5 MG) BY MOUTH DAILY 04/23/23  Yes Sheliah Hatch, MD  LO LOESTRIN FE 1 MG-10 MCG / 10 MCG tablet Take 1 tablet by mouth daily. 03/10/23  Yes [provider]  ondansetron (ZOFRAN) 4 MG tablet Take 1 tablet (4 mg total) by mouth every 8 (eight) hours as needed for nausea or vomiting. 01/01/23  Yes Sheliah Hatch, MD  oxyCODONE-acetaminophen (PERCOCET/ROXICET) 5-325 MG tablet Take 1 tablet by mouth every 4 (four) hours as needed. Dx M54.2 04/24/23  Yes Sheliah Hatch, MD  azelastine (ASTELIN) 0.1 % nasal spray Place 1 spray into both nostrils 2 (two) times daily. Use in each nostril as directed 01/17/23   Margaretann Loveless, PA-C  calcium carbonate (OSCAL) 1500 (600 Ca) MG TABS tablet Take by mouth 2 (two) times daily with a meal.    [provider]  EPINEPHrine 0.3 mg/0.3 mL IJ SOAJ injection Inject 0.3 mLs (0.3 mg total) into the muscle once. 04/21/15   Sheliah Hatch, MD  hydrocortisone (ANUSOL-HC) 25 MG suppository Place 1 suppository (25 mg total) rectally 2 (two) times daily. 03/24/23   Doree Albee, PA-C  UNABLE TO FIND AG-1 drink mix    [provider]  vitamin C (ASCORBIC ACID) 500 MG tablet Take 500 mg by mouth 2 (two) times daily.    [provider]    Current Outpatient Medications  Medication Sig Dispense Refill   ALPRAZolam (XANAX) 0.25 MG tablet Take 1 tablet (0.25 mg total) by mouth 2 (two) times daily as needed for anxiety. 30 tablet 0   cyclobenzaprine (FLEXERIL) 10 MG tablet Take 1 tablet (10 mg total) by mouth 3 (three) times daily as needed for muscle spasms. 30 tablet 0   dicyclomine (BENTYL) 20 MG tablet Take 1 tablet (20 mg total)  by mouth 3 (three) times daily as needed for spasms. 50 tablet 0   hydrochlorothiazide (HYDRODIURIL) 12.5 MG tablet TAKE 1 TABLET(12.5 MG) BY MOUTH DAILY 30 tablet 3   LO LOESTRIN FE 1 MG-10 MCG / 10 MCG tablet Take 1 tablet by mouth daily.     ondansetron (ZOFRAN) 4 MG tablet Take 1 tablet (4 mg total) by mouth every 8 (eight) hours as needed for nausea or vomiting. 30 tablet 0   oxyCODONE-acetaminophen (PERCOCET/ROXICET) 5-325 MG tablet Take 1 tablet by mouth every 4 (four) hours as needed. Dx M54.2 30 tablet 0   azelastine (ASTELIN) 0.1 % nasal spray Place 1 spray into both nostrils 2 (two) times daily. Use in each nostril as directed 30 mL 0   calcium carbonate (OSCAL) 1500 (600 Ca) MG TABS tablet Take by mouth 2 (two) times daily with a meal.     EPINEPHrine 0.3 mg/0.3 mL IJ SOAJ injection Inject 0.3 mLs (0.3 mg total) into the muscle once. 1 Device 2   hydrocortisone (ANUSOL-HC) 25 MG suppository Place 1 suppository (25 mg total) rectally 2 (two) times daily. 12 suppository 0   UNABLE TO FIND AG-1 drink mix     vitamin C (ASCORBIC ACID) 500 MG tablet Take 500 mg by mouth 2 (two) times daily.     Current Facility-Administered Medications  Medication Dose Route Frequency Provider Last Rate Last Admin   0.9 %  sodium chloride infusion  500 mL Intravenous Once Imogene Burn, MD        Allergies as of 05/20/2023 - Review Complete 05/20/2023  Allergen Reaction Noted   Flovent [fluticasone propionate]  04/18/2011   Codeine  04/12/2009   Fluticasone  04/25/2022   Fluticasone furoate  01/01/2021   Mushroom extract complex Swelling 10/24/2019   Other  06/04/2019    Family History  Problem Relation Age of Onset   Coronary artery disease Mother    Hypertension Mother    Stroke Mother    Rheum arthritis Mother    Coronary artery disease Father    Allergies Sister    Asthma Sister    Colon polyps Brother    Colon polyps Brother    Colon polyps Brother    Rheum arthritis Maternal  Grandmother    Colon cancer Neg Hx    Esophageal cancer Neg Hx    Rectal cancer Neg Hx    Stomach cancer Neg Hx     Social History   Socioeconomic History   Marital status: Married    Spouse name: Not on file   Number of children: 2   Years of education: Not on file   Highest education level: Not on file  Occupational History  Occupation:      Employer: IT sales professional [employer]   Occupation: Runner, broadcasting/film/video-- [employer]    Employer: [employer]  Tobacco Use   Smoking status: Never   Smokeless tobacco: Never  Vaping Use   Vaping Use: Never used  Substance and Sexual Activity   Alcohol use: Yes    Comment: special occasions   Drug use: No   Sexual activity: Yes    Comment: 1st intercourse 52 yo-Fewer than 5 partners-Vasectomy  Other Topics Concern   Not on file  Social History Narrative   Not on file   Social Determinants of Health   Financial Resource Strain: Not on file  Food Insecurity: Not on file  Transportation Needs: Not on file  Physical Activity: Not on file  Stress: Not on file  Social Connections: Not on file  Intimate Partner Violence: Not on file    Physical Exam: Vital signs in last 24 hours: BP 119/65   Pulse (!) 106   Temp (!) 97.5 F (36.4 C) (Temporal)   Ht 5\' 4"  (1.626 m)   Wt 190 lb (86.2 kg)   SpO2 98%   BMI 32.61 kg/m  GEN: NAD EYE: Sclerae anicteric ENT: MMM CV: Non-tachycardic Pulm: No increased WOB GI: Soft NEURO:  Alert & Oriented   Eulah Pont, MD [facility]   05/20/2023 11:41 AM

## 2023-05-20 NOTE — Progress Notes (Signed)
Pt's states no medical or surgical changes since previsit or office visit. 

## 2023-05-20 NOTE — Patient Instructions (Addendum)
Discharge instructions given. Handouts on polyps and Hemorrhoids. Resume previous medications. YOU HAD AN ENDOSCOPIC PROCEDURE TODAY AT THE [facility]:   Refer to the procedure report that was given to you for any specific questions about what was found during the examination.  If the procedure report does not answer your questions, please call your gastroenterologist to clarify.  If you requested that your care partner not be given the details of your procedure findings, then the procedure report has been included in a sealed envelope for you to review at your convenience later.  YOU SHOULD EXPECT: Some feelings of bloating in the abdomen. Passage of more gas than usual.  Walking can help get rid of the air that was put into your GI tract during the procedure and reduce the bloating. If you had a lower endoscopy (such as a colonoscopy or flexible sigmoidoscopy) you may notice spotting of blood in your stool or on the toilet paper. If you underwent a bowel prep for your procedure, you may not have a normal bowel movement for a few days.  Please Note:  You might notice some irritation and congestion in your nose or some drainage.  This is from the oxygen used during your procedure.  There is no need for concern and it should clear up in a day or so.  SYMPTOMS TO REPORT IMMEDIATELY:  Following lower endoscopy (colonoscopy or flexible sigmoidoscopy):  Excessive amounts of blood in the stool  Significant tenderness or worsening of abdominal pains  Swelling of the abdomen that is new, acute  Fever of 100F or higher  For urgent or emergent issues, a gastroenterologist can be reached at any hour by calling [phone]. Do not use MyChart messaging for urgent concerns.    DIET:  We do recommend a small meal at first, but then you may proceed to your regular diet.  Drink plenty of fluids but you should avoid alcoholic beverages for 24 hours.  ACTIVITY:  You should plan to take it easy  for the rest of today and you should NOT DRIVE or use heavy machinery until tomorrow (because of the sedation medicines used during the test).    FOLLOW UP: Our staff will call the number listed on your records the next business day following your procedure.  We will call around 7:15- 8:00 am to check on you and address any questions or concerns that you may have regarding the information given to you following your procedure. If we do not reach you, we will leave a message.     If any biopsies were taken you will be contacted by phone or by letter within the next 1-3 weeks.  Please call us at [phone] if you have not heard about the biopsies in 3 weeks.    SIGNATURES/CONFIDENTIALITY: You and/or your care partner have signed paperwork which will be entered into your electronic medical record.  These signatures attest to the fact that that the information above on your After Visit Summary has been reviewed and is understood.  Full responsibility of the confidentiality of this discharge information lies with you and/or your care-partner. 

## 2023-05-20 NOTE — Progress Notes (Signed)
Uneventful anesthetic. Report to pacu rn. Vss. Care resumed by rn. 

## 2023-05-20 NOTE — Progress Notes (Signed)
Called to room to assist during endoscopic procedure.  Patient ID and intended procedure confirmed with present staff. Received instructions for my participation in the procedure from the performing physician.  

## 2023-05-21 ENCOUNTER — Telehealth: Payer: Self-pay

## 2023-05-21 ENCOUNTER — Other Ambulatory Visit: Payer: Self-pay | Admitting: Family Medicine

## 2023-05-21 DIAGNOSIS — M62838 Other muscle spasm: Secondary | ICD-10-CM

## 2023-05-21 DIAGNOSIS — F411 Generalized anxiety disorder: Secondary | ICD-10-CM

## 2023-05-21 MED ORDER — OXYCODONE-ACETAMINOPHEN 5-325 MG PO TABS: 1.0000 | ORAL_TABLET | ORAL | 0 refills | Status: DC | PRN

## 2023-05-21 MED ORDER — CYCLOBENZAPRINE HCL 10 MG PO TABS
10.0000 mg | ORAL_TABLET | Freq: Three times a day (TID) | ORAL | 3 refills | Status: DC | PRN
Start: 2023-05-21 — End: 2023-09-09

## 2023-05-21 MED ORDER — ALPRAZOLAM 0.25 MG PO TABS
0.2500 mg | ORAL_TABLET | Freq: Two times a day (BID) | ORAL | 3 refills | Status: DC | PRN
Start: 2023-05-21 — End: 2023-09-09

## 2023-05-21 NOTE — Progress Notes (Addendum)
Brief report summary: (Provation downtime, full report was handwritten) Good prep.  Normal ileum.  Normal IC valve and appendiceal orifice.  One 10 mm ascending colon polyp was removed fully with cold snare.  One 5 mm sigmoid colon polyp was fully removed with cold snare.  Internal hemorrhoids on rectal retroflexion.  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  Recommendations: Use Imodium 2 mg as needed for diarrhea.  Can use up to 16 mg of Imodium per day.  Recommended that she retry the Bentyl medication that she was prescribed previously.  Refilled her Zofran to help with nausea and vomiting.  She has follow-up with Dr. Myrtie Neither already scheduled.

## 2023-05-21 NOTE — Telephone Encounter (Signed)
Pt aware of lab results 

## 2023-05-21 NOTE — Telephone Encounter (Signed)
  Follow up Call-     05/20/2023   10:52 AM 09/21/2021    8:37 AM  Call back number  Post procedure Call Back phone  # [phone]  Permission to leave phone message Yes Yes     Patient questions:  Do you have a fever, pain , or abdominal swelling? No. Pain Score  0 *  Have you tolerated food without any problems? Yes.    Have you been able to return to your normal activities? Yes.    Do you have any questions about your discharge instructions: Diet   No. Medications  No. Follow up visit  No.  Do you have questions or concerns about your Care? Yes.  States she "passed several blood clots via rectum last night".  She did not call the emergency number to inform [facility] of this.  She states she is feeling fine this morning and denies any further bleeding.  Will notify Dr. Leonides Schanz.  Patient instructed to call back if she passes any additional blood/clots and she verbalized understanding.  Actions: * If pain score is 4 or above: No action needed, pain <4.

## 2023-05-21 NOTE — Telephone Encounter (Signed)
Oxycodone 5-325 mg LOV: 03/10/23 Last Refill:04/24/23 Upcoming appt: none   Xanax 0.25 mg

## 2023-05-23 ENCOUNTER — Other Ambulatory Visit (HOSPITAL_COMMUNITY): Payer: Self-pay

## 2023-05-23 ENCOUNTER — Encounter: Payer: Self-pay | Admitting: Internal Medicine

## 2023-05-23 ENCOUNTER — Telehealth: Payer: Self-pay

## 2023-05-23 NOTE — Telephone Encounter (Signed)
*  Gastro  PA request received for Ondansetron  PA not submitted due to medication limits of 18 tablets per 21 days

## 2023-05-27 NOTE — Op Note (Signed)
[facility] Patient Name: Lynn Campos Procedure Date: 05/27/2023 12:50 PM MRN: [id number] Endoscopist: Madelyn Brunner Neibert , , [id number] Age: 52 Referring MD:  Date of Birth: 1971-07-26 Gender: Female Account #: [id number] Procedure:                Colonoscopy Indications:              Rectal bleeding, Change in bowel habits Medicines:                Monitored Anesthesia Care Procedure:                Pre-Anesthesia Assessment:                           - Prior to the procedure, a History and Physical                            was performed, and patient medications and                            allergies were reviewed. The patient's tolerance of                            previous anesthesia was also reviewed. The risks                            and benefits of the procedure and the sedation                            options and risks were discussed with the patient.                            All questions were answered, and informed consent                            was obtained. Prior Anticoagulants: The patient has                            taken no anticoagulant or antiplatelet agents. ASA                            Grade Assessment: II - A patient with mild systemic                            disease. After reviewing the risks and benefits,                            the patient was deemed in satisfactory condition to                            undergo the procedure.                           After obtaining informed consent, the colonoscope  was passed under direct vision. Throughout the                            procedure, the patient's blood pressure, pulse, and                            oxygen saturations were monitored continuously. The                            Olympus CF-HQ190L SN [id number] was introduced through                            the anus and advanced to the the terminal ileum.                            The colonoscopy  was performed without difficulty.                            The patient tolerated the procedure well. The                            quality of the bowel preparation was good. The                            terminal ileum, ileocecal valve, appendiceal                            orifice, and rectum were photographed. Findings:                 The terminal ileum appeared normal.                           A 10 mm polyp was found in the ascending colon. The                            polyp was sessile. The polyp was removed with a                            cold snare. Resection and retrieval were complete.                           A 5 mm polyp was found in the sigmoid colon. The                            polyp was sessile. The polyp was removed with a                            cold snare. Resection and retrieval were complete.                           Non-bleeding internal hemorrhoids were found during  retroflexion.                           Biopsies for histology were taken with a cold                            forceps from the entire colon for evaluation of                            microscopic colitis. Complications:            No immediate complications. Estimated Blood Loss:     Estimated blood loss was minimal. Impression:               - The examined portion of the ileum was normal.                           - One 10 mm polyp in the ascending colon, removed                            with a cold snare. Resected and retrieved.                           - One 5 mm polyp in the sigmoid colon, removed with                            a cold snare. Resected and retrieved.                           - Non-bleeding internal hemorrhoids.                           - Biopsies were taken with a cold forceps from the                            entire colon for evaluation of microscopic colitis.                           - Photos were not taken because Provation was                             unavailable during the procedure.                           - Scope in at 11:46 AM. Scope out at 12:00 PM.                            Cecum time at 11:49 AM. Recommendation:           - Discharge patient to home (with escort).                           - Await pathology results.                           -  Use Imodium 2 mg as needed for diarrhea. Can use                            up to 16 mg of Imodium per day.                           - Recommended that she retry the Bentyl medication                            that she was prescribed previously.                           - Refilled her Zofran to help with nausea and                            vomiting.                           - Follow-up with Dr. Myrtie Neither already scheduled.                           - The findings and recommendations were discussed                            with the patient. Dr Particia Lather "Alan Ripper" Leonides Schanz,  05/27/2023 1:00:29 PM

## 2023-06-06 ENCOUNTER — Ambulatory Visit: Payer: BC Managed Care – PPO | Admitting: Family Medicine

## 2023-06-10 ENCOUNTER — Ambulatory Visit: Payer: BC Managed Care – PPO | Admitting: Gastroenterology

## 2023-06-16 ENCOUNTER — Encounter: Payer: Self-pay | Admitting: Family Medicine

## 2023-06-16 ENCOUNTER — Ambulatory Visit: Payer: BC Managed Care – PPO | Admitting: Family Medicine

## 2023-06-16 VITALS — BP 106/80 | HR 71 | Temp 97.8°F | Resp 18 | Ht 64.0 in | Wt 183.4 lb

## 2023-06-16 DIAGNOSIS — R202 Paresthesia of skin: Secondary | ICD-10-CM

## 2023-06-16 DIAGNOSIS — R200 Anesthesia of skin: Secondary | ICD-10-CM | POA: Diagnosis not present

## 2023-06-16 DIAGNOSIS — R1084 Generalized abdominal pain: Secondary | ICD-10-CM

## 2023-06-16 LAB — CBC WITH DIFFERENTIAL/PLATELET
Basophils Absolute: 0 10*3/uL (ref 0.0–0.1)
Basophils Relative: 0.4 % (ref 0.0–3.0)
Eosinophils Absolute: 0.1 10*3/uL (ref 0.0–0.7)
Eosinophils Relative: 2.2 % (ref 0.0–5.0)
HCT: 40.7 % (ref 36.0–46.0)
Hemoglobin: 13.1 g/dL (ref 12.0–15.0)
Lymphocytes Relative: 20.4 % (ref 12.0–46.0)
Lymphs Abs: 1.1 10*3/uL (ref 0.7–4.0)
MCHC: 32.2 g/dL (ref 30.0–36.0)
MCV: 91.5 fl (ref 78.0–100.0)
Monocytes Absolute: 0.3 10*3/uL (ref 0.1–1.0)
Monocytes Relative: 5.9 % (ref 3.0–12.0)
Neutro Abs: 3.7 10*3/uL (ref 1.4–7.7)
Neutrophils Relative %: 71.1 % (ref 43.0–77.0)
Platelets: 263 10*3/uL (ref 150.0–400.0)
RBC: 4.45 Mil/uL (ref 3.87–5.11)
RDW: 13.8 % (ref 11.5–15.5)
WBC: 5.3 10*3/uL (ref 4.0–10.5)

## 2023-06-16 LAB — TSH: TSH: 1.04 u[IU]/mL (ref 0.35–5.50)

## 2023-06-16 LAB — B12 AND FOLATE PANEL
Folate: 10.6 ng/mL (ref >5.9)
Vitamin B-12: 146 pg/mL — ABNORMAL LOW (ref 211–911)

## 2023-06-16 MED ORDER — HYOSCYAMINE SULFATE 0.125 MG PO TABS: 0.1250 mg | ORAL_TABLET | ORAL | 0 refills | Status: DC | PRN

## 2023-06-16 MED ORDER — GABAPENTIN 300 MG PO CAPS: 300.0000 mg | ORAL_CAPSULE | Freq: Two times a day (BID) | ORAL | 3 refills | Status: DC

## 2023-06-16 NOTE — Patient Instructions (Addendum)
Follow up in 6 weeks to recheck abd pain and feet We'll notify you of your lab results and make any changes if needed START the Gabapentin- afternoon and before bed USE the Hyoscyamine as needed for abdominal pain/cramping Drink LOTS of water Call with any questions or concerns Hang in there!

## 2023-06-16 NOTE — Progress Notes (Signed)
   Subjective:    Patient ID: Lynn Campos, female    DOB: 12-12-1970, 52 y.o.   MRN: [id number]  HPI Numbness in feet- pt reports several months of numbness.  Worse at night.  Soles of feet burn.  Pt used leftover gabapentin that caused improvement after 1 week.  Then ran out of medication.    Diarrhea/abd pain- pt reports ongoing sxs despite seeing GI and having a colonoscopy.  Reports diarrhea is severely impacting her life and ability to do things b/c she never knows when sxs will occur.  Has decreased dairy, gluten.  Trying to follow FODMAP diet.  Feels like she is not being heard at current GI office and that once they deemed no UC the work up stopped.  No relief w/ Bentyl.   Review of Systems For ROS see HPI     Objective:   Physical Exam Vitals reviewed.  Constitutional:      General: She is not in acute distress.    Appearance: She is well-developed. She is not ill-appearing.  HENT:     Head: Normocephalic and atraumatic.  Eyes:     Conjunctiva/sclera: Conjunctivae normal.     Pupils: Pupils are equal, round, and reactive to light.  Neck:     Thyroid: No thyromegaly.  Cardiovascular:     Rate and Rhythm: Normal rate and regular rhythm.     Heart sounds: Normal heart sounds. No murmur heard. Pulmonary:     Effort: Pulmonary effort is normal. No respiratory distress.     Breath sounds: Normal breath sounds.  Abdominal:     General: There is no distension.     Palpations: Abdomen is soft.     Tenderness: There is no abdominal tenderness. There is no guarding or rebound.  Musculoskeletal:     Cervical back: Normal range of motion and neck supple.  Lymphadenopathy:     Cervical: No cervical adenopathy.  Skin:    General: Skin is warm and dry.  Neurological:     General: No focal deficit present.     Mental Status: She is alert and oriented to person, place, and time.     Cranial Nerves: No cranial nerve deficit.     Sensory: Sensory deficit (decreased sensation on  plantar surface of feet bilaterally) present.  Psychiatric:        Behavior: Behavior normal.           Assessment & Plan:   Numbness/tingling of feet- new.  Pt reports sxs are bilateral and primarily on the plantar surfaces.  No change in footwear.  No hx of DM.  Discussed that B12 deficiency or thyroid abnormality can cause numbness.  Will check labs.  Since left over gabapentin improved sxs, will start twice daily- w/ an afternoon dose and 1 before bed.  Pt expressed understanding and is in agreement w/ plan.   Abd pain/diarrhea- deteriorated.  Pt reports this is now impacting her quality of life b/c she never knows when sxs will occur.  She has not found a particular food trigger.  No relief w/ Bentyl.  She does not have a gallbladder so Viberzi is not an option.  Will start Hyoscyamine and refer for a 2nd opinion.  Pt expressed understanding and is in agreement w/ plan.

## 2023-06-17 ENCOUNTER — Ambulatory Visit: Payer: BC Managed Care – PPO | Admitting: Gastroenterology

## 2023-06-17 ENCOUNTER — Ambulatory Visit: Payer: BC Managed Care – PPO

## 2023-06-17 ENCOUNTER — Telehealth: Payer: Self-pay

## 2023-06-17 DIAGNOSIS — E538 Deficiency of other specified B group vitamins: Secondary | ICD-10-CM

## 2023-06-17 DIAGNOSIS — R200 Anesthesia of skin: Secondary | ICD-10-CM

## 2023-06-17 MED ORDER — CYANOCOBALAMIN 1000 MCG/ML IJ SOLN
1000.0000 ug | Freq: Once | INTRAMUSCULAR | Status: AC
Start: 2023-06-17 — End: 2023-06-17
  Administered 2023-06-17: 1000 ug via INTRAMUSCULAR

## 2023-06-17 NOTE — Telephone Encounter (Signed)
I I have spoke to the pt and advised she needs to reach out to [facility] and see if they are ok with putting her on their nurse schedule . She states she will just come here so we are scheduling her an apt

## 2023-06-17 NOTE — Telephone Encounter (Signed)
Informed pf of lab results and she is asking if she can go to [place] since she lives close to that office and have them give her the B 12 injections?

## 2023-06-17 NOTE — Progress Notes (Signed)
Pt came in for her first B 12 injection and she tolerated well .

## 2023-06-17 NOTE — Telephone Encounter (Signed)
We would have to ask [facility] if it would be ok to put her on their nurse schedule.  I don't want to assume that they are ok accepting that responsibility.  I added Tresa Endo to this message to see if she can help facilitate

## 2023-06-17 NOTE — Telephone Encounter (Signed)
-----   Message from Neena Rhymes sent at 06/17/2023  7:47 AM EDT ----- Your B12 is low.  This can contribute to numbness/tingling of feet.  Based on this, we need to start monthly B12 injections x6 months and add a daily OTC B12 (or B complex) supplement.  Remainder of labs look great!

## 2023-06-18 ENCOUNTER — Other Ambulatory Visit: Payer: Self-pay | Admitting: Family Medicine

## 2023-06-18 ENCOUNTER — Ambulatory Visit: Payer: BC Managed Care – PPO | Admitting: Family Medicine

## 2023-06-18 NOTE — Telephone Encounter (Signed)
Patient is requesting a refill of the following medications: Requested Prescriptions   Pending Prescriptions Disp Refills   oxyCODONE-acetaminophen (PERCOCET/ROXICET) 5-325 MG tablet 30 tablet 0    Sig: Take 1 tablet by mouth every 4 (four) hours as needed. Dx M54.2    Date of patient request: 06/18/23 Last office visit: 06/16/23 Date of last refill: 05/21/23 Last refill amount: 30

## 2023-06-19 MED ORDER — OXYCODONE-ACETAMINOPHEN 5-325 MG PO TABS: 1.0000 | ORAL_TABLET | ORAL | 0 refills | Status: DC | PRN

## 2023-06-23 NOTE — Telephone Encounter (Signed)
Spoke to patient and let her know that she did still have the option and "ok" to get her injection at [facility] if this is more convenient. She said she will give them a call to set up her next appt. I did get the ok from [place].

## 2023-07-02 ENCOUNTER — Ambulatory Visit: Payer: BC Managed Care – PPO | Admitting: Gastroenterology

## 2023-07-16 ENCOUNTER — Other Ambulatory Visit: Payer: Self-pay | Admitting: Family Medicine

## 2023-07-17 MED ORDER — OXYCODONE-ACETAMINOPHEN 5-325 MG PO TABS: 1.0000 | ORAL_TABLET | ORAL | 0 refills | Status: DC | PRN

## 2023-07-17 NOTE — Telephone Encounter (Signed)
Oxycodone 5-325 mg Requested Prescriptions   Pending Prescriptions Disp Refills   oxyCODONE-acetaminophen (PERCOCET/ROXICET) 5-325 MG tablet 30 tablet 0    Sig: Take 1 tablet by mouth every 4 (four) hours as needed. Dx M54.2     Date of patient request: 07/17/23 Last office visit: 06/16/23 Date of last refill: 06/19/23 Last refill amount: 30 Follow up time period per chart: 6 wks

## 2023-07-23 ENCOUNTER — Ambulatory Visit (INDEPENDENT_AMBULATORY_CARE_PROVIDER_SITE_OTHER): Payer: BC Managed Care – PPO

## 2023-07-23 ENCOUNTER — Other Ambulatory Visit: Payer: Self-pay

## 2023-07-23 DIAGNOSIS — E538 Deficiency of other specified B group vitamins: Secondary | ICD-10-CM

## 2023-07-23 DIAGNOSIS — R200 Anesthesia of skin: Secondary | ICD-10-CM | POA: Diagnosis not present

## 2023-07-23 DIAGNOSIS — R202 Paresthesia of skin: Secondary | ICD-10-CM

## 2023-07-23 MED ORDER — CYANOCOBALAMIN 1000 MCG/ML IJ SOLN
1000.0000 ug | INTRAMUSCULAR | Status: AC
Start: 2023-07-23 — End: 2023-12-30
  Administered 2023-07-23 – 2023-12-30 (×6): 1000 ug via INTRAMUSCULAR

## 2023-07-23 NOTE — Progress Notes (Signed)
Patient presents for 1 of 6 monthly B12 injections. Injection placed in left deltoid region. Patient tolerated procedure well with no concerns.  Patient advised to return in 30 days.

## 2023-08-12 ENCOUNTER — Other Ambulatory Visit: Payer: Self-pay | Admitting: Family Medicine

## 2023-08-12 MED ORDER — OXYCODONE-ACETAMINOPHEN 5-325 MG PO TABS: 1.0000 | ORAL_TABLET | ORAL | 0 refills | Status: DC | PRN

## 2023-08-12 NOTE — Telephone Encounter (Signed)
Patient is requesting a refill of the following medications: Requested Prescriptions   Pending Prescriptions Disp Refills   oxyCODONE-acetaminophen (PERCOCET/ROXICET) 5-325 MG tablet 30 tablet 0    Sig: Take 1 tablet by mouth every 4 (four) hours as needed. Dx M54.2    Date of patient request: 08/12/2023 Last office visit: 07/17/23 Date of last refill: 06/16/23 Last refill amount: 30 Follow up time period per chart: 6 weeks

## 2023-08-20 ENCOUNTER — Other Ambulatory Visit: Payer: Self-pay | Admitting: Family Medicine

## 2023-08-27 ENCOUNTER — Ambulatory Visit: Payer: BC Managed Care – PPO

## 2023-08-27 DIAGNOSIS — E538 Deficiency of other specified B group vitamins: Secondary | ICD-10-CM

## 2023-08-27 NOTE — Progress Notes (Signed)
Pt here for monthly B12 injection per   B12 given IM  Left Deltoid and pt tolerated injection well.  Next B12 injection scheduled for

## 2023-09-03 ENCOUNTER — Other Ambulatory Visit: Payer: Self-pay | Admitting: Family Medicine

## 2023-09-03 MED ORDER — OXYCODONE-ACETAMINOPHEN 5-325 MG PO TABS: 1.0000 | ORAL_TABLET | ORAL | 0 refills | Status: DC | PRN

## 2023-09-03 NOTE — Telephone Encounter (Signed)
Patient is requesting a refill of the following medications: Requested Prescriptions   Pending Prescriptions Disp Refills   oxyCODONE-acetaminophen (PERCOCET/ROXICET) 5-325 MG tablet 30 tablet 0    Sig: Take 1 tablet by mouth every 4 (four) hours as needed. Dx M54.2    Date of patient request: 09/03/23 Last office visit: 06/16/23 Date of last refill: 08/12/23 Last refill amount: 30 Follow up time period per chart:  6 weeks

## 2023-09-09 ENCOUNTER — Other Ambulatory Visit: Payer: Self-pay | Admitting: Family Medicine

## 2023-09-09 DIAGNOSIS — F411 Generalized anxiety disorder: Secondary | ICD-10-CM

## 2023-09-09 DIAGNOSIS — M62838 Other muscle spasm: Secondary | ICD-10-CM

## 2023-09-09 NOTE — Telephone Encounter (Signed)
Last office visit 06/16/2023 Last refill on Alprazolam 0.25 05/21/2023

## 2023-09-24 ENCOUNTER — Ambulatory Visit: Payer: BC Managed Care – PPO

## 2023-09-24 DIAGNOSIS — E538 Deficiency of other specified B group vitamins: Secondary | ICD-10-CM | POA: Diagnosis not present

## 2023-09-24 NOTE — Progress Notes (Signed)
Pt here for monthly B12 injection per   B12 given IM and pt tolerated injection well.  Next B12 injection scheduled for

## 2023-09-25 ENCOUNTER — Other Ambulatory Visit: Payer: Self-pay | Admitting: Family Medicine

## 2023-09-25 MED ORDER — OXYCODONE-ACETAMINOPHEN 5-325 MG PO TABS: 1.0000 | ORAL_TABLET | ORAL | 0 refills | Status: DC | PRN

## 2023-09-25 NOTE — Telephone Encounter (Signed)
Patient is requesting a refill of the following medications: Requested Prescriptions   Pending Prescriptions Disp Refills   oxyCODONE-acetaminophen (PERCOCET/ROXICET) 5-325 MG tablet 30 tablet 0    Sig: Take 1 tablet by mouth every 4 (four) hours as needed. Dx M54.2    Date of patient request: 09/25/23 Last office visit: 06/16/23 Date of last refill: 09/03/23 Last refill amount: 30 Follow up time period per chart: 6 months

## 2023-09-29 ENCOUNTER — Encounter: Payer: Self-pay | Admitting: Family Medicine

## 2023-09-29 ENCOUNTER — Ambulatory Visit: Payer: BC Managed Care – PPO | Admitting: Family Medicine

## 2023-09-29 VITALS — BP 128/78 | HR 91 | Temp 97.9°F | Ht 64.0 in | Wt 187.4 lb

## 2023-09-29 DIAGNOSIS — R509 Fever, unspecified: Secondary | ICD-10-CM | POA: Diagnosis not present

## 2023-09-29 DIAGNOSIS — J189 Pneumonia, unspecified organism: Secondary | ICD-10-CM | POA: Diagnosis not present

## 2023-09-29 LAB — POC INFLUENZA A&B (BINAX/QUICKVUE)
Influenza A, POC: NEGATIVE
Influenza B, POC: NEGATIVE

## 2023-09-29 MED ORDER — AZITHROMYCIN 250 MG PO TABS: ORAL_TABLET | ORAL | 0 refills | Status: DC

## 2023-09-29 MED ORDER — PROMETHAZINE-DM 6.25-15 MG/5ML PO SYRP: 5.0000 mL | ORAL_SOLUTION | Freq: Four times a day (QID) | ORAL | 0 refills | Status: DC | PRN

## 2023-09-29 NOTE — Progress Notes (Signed)
   Subjective:    Patient ID: Lynn Campos, female    DOB: 1971/01/20, 52 y.o.   MRN: [id number]  HPI Cough- sxs started w/ 'lots of sinus pressure and feeling yucky'.  Sinus sxs seem to be improving.  Sxs started 1 week ago.  Pt reports HA and cough 'are hanging on'.  3 co-workers have been dx'd w/ 'walking PNA'.  + low grade temps persisting.  Cough is now painful, intermittently productive.  Sputum has changed from 'greenish yellow to thick brown'.  Denies facial pain, pressure.   Review of Systems For ROS see HPI     Objective:   Physical Exam Vitals reviewed.  Constitutional:      General: She is not in acute distress.    Appearance: Normal appearance. She is well-developed. She is not ill-appearing.  HENT:     Head: Normocephalic and atraumatic.     Right Ear: Tympanic membrane and ear canal normal.     Left Ear: Tympanic membrane and ear canal normal.     Nose: Congestion present. No rhinorrhea.     Mouth/Throat:     Pharynx: Oropharynx is clear. No oropharyngeal exudate or posterior oropharyngeal erythema.  Eyes:     Extraocular Movements: EOM normal.     Conjunctiva/sclera: Conjunctivae normal.     Pupils: Pupils are equal, round, and reactive to light.  Cardiovascular:     Rate and Rhythm: Normal rate and regular rhythm.     Heart sounds: Normal heart sounds. No murmur heard. Pulmonary:     Effort: Pulmonary effort is normal. No respiratory distress.     Breath sounds: Rhonchi (faint crackles over L base) present. No wheezing.  Musculoskeletal:     Cervical back: Normal range of motion and neck supple.  Lymphadenopathy:     Cervical: No cervical adenopathy.  Skin:    General: Skin is warm and dry.  Neurological:     General: No focal deficit present.     Mental Status: She is alert and oriented to person, place, and time.  Psychiatric:        Mood and Affect: Mood normal.        Behavior: Behavior normal.        Thought Content: Thought content normal.            Assessment & Plan:  Atypical PNA- new.  Pt has multiple known sick contacts w/ 'walking pna'.  She has had 2 weeks of sxs w/ low grade fever, congestion, now painful cough w/ discolored sputum.  Start Azithromycin.  Cough syrup prn.  Reviewed supportive care and red flags that should prompt return.  Pt expressed understanding and is in agreement w/ plan.

## 2023-09-29 NOTE — Patient Instructions (Signed)
Follow up as needed or as scheduled START the Zpack as directed USE the cough syrup as needed- may cause drowsiness Drink plenty of fluids REST!!! Call with any questions or concerns Hang in there!!!

## 2023-10-09 ENCOUNTER — Other Ambulatory Visit: Payer: Self-pay | Admitting: Family Medicine

## 2023-10-15 ENCOUNTER — Other Ambulatory Visit: Payer: Self-pay | Admitting: Family Medicine

## 2023-10-15 NOTE — Telephone Encounter (Signed)
Requested Prescriptions   Pending Prescriptions Disp Refills   oxyCODONE-acetaminophen (PERCOCET/ROXICET) 5-325 MG tablet 30 tablet 0    Sig: Take 1 tablet by mouth every 4 (four) hours as needed. Dx M54.2     Date of patient request: 10/15/23 09/29/2023 Visit date not found Date of last refill: 09/25/2023 Last refill amount: 30

## 2023-10-16 MED ORDER — OXYCODONE-ACETAMINOPHEN 5-325 MG PO TABS: 1.0000 | ORAL_TABLET | ORAL | 0 refills | Status: DC | PRN

## 2023-10-29 ENCOUNTER — Ambulatory Visit: Payer: BC Managed Care – PPO

## 2023-10-29 ENCOUNTER — Other Ambulatory Visit: Payer: Self-pay | Admitting: Family Medicine

## 2023-10-29 DIAGNOSIS — F411 Generalized anxiety disorder: Secondary | ICD-10-CM

## 2023-10-29 DIAGNOSIS — E538 Deficiency of other specified B group vitamins: Secondary | ICD-10-CM

## 2023-10-29 NOTE — Progress Notes (Addendum)
Pt here for monthly B12 injection per   B12 given Right IM and pt tolerated injection well.  Next B12 injection scheduled for

## 2023-11-12 ENCOUNTER — Other Ambulatory Visit: Payer: Self-pay | Admitting: Family Medicine

## 2023-11-13 MED ORDER — OXYCODONE-ACETAMINOPHEN 5-325 MG PO TABS: 1.0000 | ORAL_TABLET | ORAL | 0 refills | Status: DC | PRN

## 2023-11-27 ENCOUNTER — Ambulatory Visit: Payer: 59

## 2023-11-27 DIAGNOSIS — R202 Paresthesia of skin: Secondary | ICD-10-CM

## 2023-11-27 DIAGNOSIS — R200 Anesthesia of skin: Secondary | ICD-10-CM | POA: Diagnosis not present

## 2023-11-27 NOTE — Progress Notes (Signed)
 Patient presents for b12 injection. Injection placed in right deltoid region. Patient tolerated procedure well with no concerns.  Next B12 injection due around 12/25/2023.

## 2023-11-28 ENCOUNTER — Other Ambulatory Visit: Payer: Self-pay | Admitting: Family Medicine

## 2023-12-04 ENCOUNTER — Encounter: Payer: Self-pay | Admitting: Family Medicine

## 2023-12-04 ENCOUNTER — Other Ambulatory Visit: Payer: Self-pay | Admitting: Family Medicine

## 2023-12-04 DIAGNOSIS — M62838 Other muscle spasm: Secondary | ICD-10-CM

## 2023-12-04 MED ORDER — CYCLOBENZAPRINE HCL 10 MG PO TABS: 10.0000 mg | ORAL_TABLET | Freq: Three times a day (TID) | ORAL | 3 refills | Status: DC | PRN

## 2023-12-04 MED ORDER — OXYCODONE-ACETAMINOPHEN 5-325 MG PO TABS: 1.0000 | ORAL_TABLET | ORAL | 0 refills | Status: DC | PRN

## 2023-12-25 ENCOUNTER — Other Ambulatory Visit: Payer: Self-pay | Admitting: Family Medicine

## 2023-12-25 MED ORDER — OXYCODONE-ACETAMINOPHEN 5-325 MG PO TABS: 1.0000 | ORAL_TABLET | ORAL | 0 refills | Status: DC | PRN

## 2023-12-25 NOTE — Telephone Encounter (Signed)
Requested Prescriptions   Pending Prescriptions Disp Refills   oxyCODONE-acetaminophen (PERCOCET/ROXICET) 5-325 MG tablet 30 tablet 0    Sig: Take 1 tablet by mouth every 4 (four) hours as needed. Dx M54.2     Date of patient request: 12/25/2023 Last office visit: 09/29/2023 Upcoming visit: Visit date not found Date of last refill: 12/04/2023 Last refill amount: 30

## 2023-12-30 ENCOUNTER — Ambulatory Visit (INDEPENDENT_AMBULATORY_CARE_PROVIDER_SITE_OTHER): Payer: 59

## 2023-12-30 DIAGNOSIS — E538 Deficiency of other specified B group vitamins: Secondary | ICD-10-CM

## 2023-12-30 NOTE — Progress Notes (Addendum)
 PT here for monthly B12 injection   B12 was given in right deltoid (IM) and pt tolerated injection well with no redness or swelling at site.   Next B12 injection scheduled for January 27, 2024 1 month follow up

## 2024-01-07 ENCOUNTER — Encounter: Payer: 59 | Admitting: Nurse Practitioner

## 2024-01-07 ENCOUNTER — Encounter: Payer: Self-pay | Admitting: Nurse Practitioner

## 2024-01-07 ENCOUNTER — Encounter: Payer: Self-pay | Admitting: Family Medicine

## 2024-01-07 ENCOUNTER — Ambulatory Visit: Payer: 59 | Admitting: Nurse Practitioner

## 2024-01-07 VITALS — BP 136/88 | HR 83 | Temp 98.4°F | Ht 64.0 in | Wt 194.0 lb

## 2024-01-07 DIAGNOSIS — M7581 Other shoulder lesions, right shoulder: Secondary | ICD-10-CM

## 2024-01-07 MED ORDER — ACETAMINOPHEN ER 650 MG PO TBCR: 650.0000 mg | EXTENDED_RELEASE_TABLET | Freq: Three times a day (TID) | ORAL | Status: AC | PRN

## 2024-01-07 MED ORDER — PREDNISONE 20 MG PO TABS: ORAL_TABLET | ORAL | 0 refills | Status: DC

## 2024-01-07 NOTE — Progress Notes (Signed)
Acute Office Visit  Subjective:    Patient ID: Lynn Campos, female    DOB: [birth year], 53 y.o.   MRN: [id number]  Chief Complaint  Patient presents with   Joint pain     Joint pain/bursitis    Shoulder Pain  The pain is present in the right shoulder. This is a recurrent problem. The current episode started more than 1 month ago. There has been no history of extremity trauma. The problem occurs intermittently. The problem has been rapidly worsening. The quality of the pain is described as aching. The pain is severe. Associated symptoms include a limited range of motion and stiffness. Pertinent negatives include no fever, inability to bear weight, itching, joint locking, joint swelling, numbness or tingling. The symptoms are aggravated by activity and lying down. She has tried NSAIDS for the symptoms. The treatment provided mild relief. Family history does not include gout or rheumatoid arthritis. Her past medical history is significant for osteoarthritis. There is no history of diabetes, gout or rheumatoid arthritis.  Ibuprofen 400mg  every 4hrs with some improvement. Had similar symptoms several years ago. Resolved with prednisone  dose pack per patient. She is right hand dominant. She is a Scientist, research (medical). Denies any repetitive movement or lifting or pushing. S/p L. rotator cuff surgery 2015  Outpatient Medications Prior to Visit  Medication Sig   ALPRAZolam (XANAX) 0.25 MG tablet TAKE 1 TABLET(0.25 MG) BY MOUTH TWICE DAILY AS NEEDED FOR ANXIETY   calcium carbonate (OSCAL) 1500 (600 Ca) MG TABS tablet Take by mouth 2 (two) times daily with a meal.   cyclobenzaprine (FLEXERIL) 10 MG tablet Take 1 tablet (10 mg total) by mouth 3 (three) times daily as needed for muscle spasms.   EPINEPHrine 0.3 mg/0.3 mL IJ SOAJ injection Inject 0.3 mLs (0.3 mg total) into the muscle once.   gabapentin (NEURONTIN) 300 MG capsule TAKE 1 CAPSULE(300 MG) BY MOUTH TWICE DAILY   hydrochlorothiazide  (HYDRODIURIL) 12.5 MG tablet TAKE 1 TABLET(12.5 MG) BY MOUTH DAILY   hyoscyamine (LEVSIN) 0.125 MG tablet Take 1 tablet (0.125 mg total) by mouth every 4 (four) hours as needed.   LO LOESTRIN FE 1 MG-10 MCG / 10 MCG tablet Take 1 tablet by mouth daily.   loperamide (IMODIUM A-D) 2 MG tablet Take 1 tablet (2 mg total) by mouth 4 (four) times daily as needed for diarrhea or loose stools (Can use up to 16 mg per day for diarrhea).   ondansetron (ZOFRAN-ODT) 8 MG disintegrating tablet Take 1 tablet (8 mg total) by mouth every 8 (eight) hours as needed for nausea or vomiting.   UNABLE TO FIND AG-1 drink mix   vitamin C (ASCORBIC ACID) 500 MG tablet Take 500 mg by mouth 2 (two) times daily.   [DISCONTINUED] oxyCODONE-acetaminophen (PERCOCET/ROXICET) 5-325 MG tablet Take 1 tablet by mouth every 4 (four) hours as needed. Dx M54.2   [DISCONTINUED] azelastine (ASTELIN) 0.1 % nasal spray Place 1 spray into both nostrils 2 (two) times daily. Use in each nostril as directed (Patient not taking: Reported on 01/07/2024)   [DISCONTINUED] azithromycin (ZITHROMAX) 250 MG tablet 2 tabs on day 1, 1 tab on day 2-5 (Patient not taking: Reported on 01/07/2024)   [DISCONTINUED] hydrocortisone (ANUSOL-HC) 25 MG suppository Place 1 suppository (25 mg total) rectally 2 (two) times daily. (Patient not taking: Reported on 01/07/2024)   [DISCONTINUED] promethazine-dextromethorphan (PROMETHAZINE-DM) 6.25-15 MG/5ML syrup Take 5 mLs by mouth 4 (four) times daily as needed. (Patient not taking: Reported on 01/07/2024)  No facility-administered medications prior to visit.    Reviewed past medical and social history.  Review of Systems  Constitutional:  Negative for fever.  Musculoskeletal:  Positive for stiffness. Negative for gout.  Skin:  Negative for itching.  Neurological:  Negative for tingling and numbness.   Per HPI     Objective:    Physical Exam Vitals and nursing note reviewed.  Musculoskeletal:     Right  shoulder: Tenderness present. No effusion or crepitus. Normal range of motion. Normal strength.     Left shoulder: Normal.     Right upper arm: Normal.     Left upper arm: Normal.     Right elbow: Normal.     Left elbow: Normal.     Cervical back: Normal range of motion and neck supple.     Comments: Anterior and lateral right shoulder tenderness with flexion, extension and external rotation.  Lymphadenopathy:     Cervical: No cervical adenopathy.  Neurological:     Mental Status: She is alert.    BP 136/88 (BP Location: Left Arm, Patient Position: Sitting, Cuff Size: Normal)   Pulse 83   Temp 98.4 F (36.9 C)   Ht 5\' 4"  (1.626 m)   Wt 194 lb (88 kg)   SpO2 99%   BMI 33.30 kg/m    No results found for any visits on 01/07/24.     Assessment & Plan:   Problem List Items Addressed This Visit   None Visit Diagnoses       Rotator cuff tendonitis, right    -  Primary   Relevant Medications   predniSONE (DELTASONE) 20 MG tablet   acetaminophen (TYLENOL 8 HOUR) 650 MG CR tablet      Meds ordered this encounter  Medications   predniSONE (DELTASONE) 20 MG tablet    Sig: Take 2 tablets (40 mg total) by mouth daily with breakfast for 2 days, THEN 1.5 tablets (30 mg total) daily with breakfast for 2 days, THEN 1.5 tablets (30 mg total) daily with breakfast for 2 days, THEN 1 tablet (20 mg total) daily with breakfast for 2 days.    Dispense:  12 tablet    Refill:  0    Supervising Provider:   Nadene Rubins ALFRED [[id number]]   acetaminophen (TYLENOL 8 HOUR) 650 MG CR tablet    Sig: Take 1 tablet (650 mg total) by mouth every 8 (eight) hours as needed for pain.    Supervising Provider:   Nadene Rubins ALFRED [[id number]]   No follow-ups on file.  Alysia Penna, NP

## 2024-01-07 NOTE — Patient Instructions (Addendum)
F/up with pcp if no improvement in 1week Hold OVER THE COUNTER NSAIDs while taking oral prednisone Alternate between warm and cold compress  Shoulder Range of Motion Exercises Shoulder range of motion (ROM) exercises are done to keep the shoulder moving freely or to increase movement. They are recommended for people who have shoulder pain or stiffness or who are recovering from a shoulder surgery. Ask your health care provider which exercises are safe for you. Do exercises exactly as told by your health care provider and adjust them as directed. It is normal to feel mild stretching, pulling, tightness, or discomfort as you do these exercises. Stop right away if you feel sudden pain or your pain gets worse. Do not begin these exercises until told by your health care provider. Phase 1 exercise When you are able, do this exercise 1-2 times a day for 30-60 seconds in each direction, or as directed by your health care provider. Pendulum exercise     To do this exercise while sitting: Sit in a chair or at the edge of your bed with your feet flat on the floor. Let your affected arm hang down in front of you over the edge of the bed or chair. Relax your shoulder, arm, and hand. Rock your body so your arm gently swings in small circles. You can also use your unaffected arm to start the motion. Repeat, changing the direction of the circles, swinging your arm left and right, and swinging your arm forward and back. To do this exercise while standing: Stand next to a sturdy chair or table, and hold on to it with your hand on your unaffected side. Bend forward at the waist. Bend your knees slightly. Relax your shoulder, arm, and hand. While keeping your shoulder relaxed, use body motion to swing your arm in small circles. Repeat, changing the direction of the circles, swinging your arm left and right, and swinging your arm forward and back. Between exercises, stand up tall and take a short break to relax  your lower back.  Phase 2 exercises Do these exercises 1-2 times a day or as told by your health care provider. Hold each stretch for 30 seconds, and repeat 3 times. Do the exercises with one or both arms as instructed by your health care provider. For these exercises, sit at a table with your hand and arm supported by the table. A chair that slides easily or has wheels can be helpful. External rotation  Turn your chair so that your affected side is nearest to the table. Place your forearm on the table to your side. Bend your arm to about a 90-degree angle (right angle) at the elbow, and place your hand palm-down on the table. Your elbow should be about 6 inches (15 cm) away from your side. Keeping your arm on the table, lean your body forward. Abduction  Turn your chair so that your affected side is nearest to the table. Place your forearm and hand on the table so that your thumb points toward the ceiling and your arm is straight out to your side. Slide your hand out to the side and away from you. To increase the stretch, you can slide your chair away from the table. Flexion: forward stretch  Sit facing the table. Place your hand and elbow on the table in front of you. Slide your hand forward and away from you, using your unaffected arm to do the work. To increase the stretch, you can slide your chair backward. Phase  3 exercises Do these exercises 1-2 times a day or as told by your health care provider. Hold each stretch for 30 seconds, and repeat 3 times. Do the exercises with one or both arms as instructed by your health care provider. You will need a cane, a piece of PVC pipe, or a sturdy wooden dowel for the wand exercises. Cross-body stretch: posterior capsule stretch  Lift your arm straight out in front of you. Bend your arm in a 90-degree angle (right angle) at the elbow so your forearm moves across your body. Use your other arm to gently pull the elbow across your body, toward  your other shoulder. Wall climbs  Stand with your affected arm extended out to the side with your hand resting on a door frame. Slide your hand slowly up the door frame. To increase the stretch, step through the door frame. Keep your body upright and do not lean. Flexion     To do this exercise while standing: Hold the wand with both of your hands, palms-down. Lift the wand up and over your head, if able. Lift mostly with your affected arm, and use the other arm to help. Push upward with your other arm to gently increase the stretch. To do this exercise while lying down: Lie on your back with your elbows resting on the floor and the wand in both your hands. Your hands will be palm-down, or pointing toward your feet. Lift your hands toward the ceiling, using your unaffected arm to help if needed. Bring your arms overhead as able, using your unaffected arm to help if needed.  Internal rotation  Stand while holding the wand behind you with both hands. Your unaffected arm should be extended above your head with the arm of the affected side extended behind you at the level of your waist. The wand should be pointing straight up and down as you hold it. Slowly pull the wand up behind your back by straightening the elbow of your unaffected arm and bending the elbow of your affected arm. External rotation  Lie on your back with your affected upper arm supported on a small pillow or rolled towel. When you first do this exercise, keep your upper arm close to your body. Over time, bring your arm up to a 90-degree angle (right angle) out to the side. Hold the wand across your stomach and with both hands palm-up. Your elbow on your affected side should be bent at a 90-degree angle. Use your unaffected side to help push your forearm away from you and toward the floor. Keep your elbow on your affected side bent at a 90-degree angle. This information is not intended to replace advice given to you by your  health care provider. Make sure you discuss any questions you have with your health care provider. Document Revised: 01/01/2022 Document Reviewed: 01/01/2022 Elsevier Patient Education  2024 [employer].

## 2024-01-08 ENCOUNTER — Encounter: Payer: Self-pay | Admitting: Nurse Practitioner

## 2024-01-08 NOTE — Progress Notes (Signed)
Appointment switched to in person This encounter was created in error - please disregard.

## 2024-01-13 ENCOUNTER — Encounter: Payer: Self-pay | Admitting: Family Medicine

## 2024-01-13 NOTE — Telephone Encounter (Signed)
 Is there anything we can recommend OTC or does patient need appointment?

## 2024-01-14 ENCOUNTER — Encounter: Payer: Self-pay | Admitting: Family Medicine

## 2024-01-14 ENCOUNTER — Telehealth (INDEPENDENT_AMBULATORY_CARE_PROVIDER_SITE_OTHER): Payer: 59 | Admitting: Family Medicine

## 2024-01-14 DIAGNOSIS — U071 COVID-19: Secondary | ICD-10-CM

## 2024-01-14 MED ORDER — PROMETHAZINE-DM 6.25-15 MG/5ML PO SYRP: 5.0000 mL | ORAL_SOLUTION | Freq: Four times a day (QID) | ORAL | 0 refills | Status: DC | PRN

## 2024-01-14 MED ORDER — PREDNISONE 10 MG PO TABS: ORAL_TABLET | ORAL | 0 refills | Status: DC

## 2024-01-14 NOTE — Progress Notes (Signed)
 Virtual Visit via Video   I connected with patient on 01/14/24 at 11:20 AM EST by a video enabled telemedicine application and verified that I am speaking with the correct person using two identifiers.  Location patient: Home Location provider: [facility] Office Persons participating in the virtual visit: Patient, Provider, CMA Archie Patten H)  I discussed the limitations of evaluation and management by telemedicine and the availability of in person appointments. The patient expressed understanding and agreed to proceed.  Subjective:   HPI:   COVID- pt developed sxs on Sunday w/ sore throat, HA, congestion, and diarrhea.  Initially thought she just had a cold.  Cough is productive.  Monday had cough, congestion, low grade fever.  Tested + yesterday.  Today is feeling 'pretty awful'- tired, sore throat, HA (responds to Tylenol and Ibuprofen), cough, diarrhea.  Currently has albuterol and is on Prednisone 20 mg daily x2 days.  Cough is productive and increased SOB.    ROS:   See pertinent positives and negatives per HPI.  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   Anxiety state 08/14/2010   ROSACEA 07/25/2009   MITRAL REGURGITATION 02/18/2009   GERD 02/18/2009   LUPUS 02/18/2009   PALPITATIONS 02/18/2009    Social History   Tobacco Use   Smoking status: Never   Smokeless tobacco: Never  Substance Use Topics   Alcohol use: Yes    Comment: special occasions    Current Outpatient Medications:    acetaminophen  (TYLENOL 8 HOUR) 650 MG CR tablet, Take 1 tablet (650 mg total) by mouth every 8 (eight) hours as needed for pain., Disp: , Rfl:    ALPRAZolam (XANAX) 0.25 MG tablet, TAKE 1 TABLET(0.25 MG) BY MOUTH TWICE DAILY AS NEEDED FOR ANXIETY, Disp: 30 tablet, Rfl: 3   calcium carbonate (OSCAL) 1500 (600 Ca) MG TABS tablet, Take by mouth 2 (two) times daily with a meal., Disp: , Rfl:    cyclobenzaprine (FLEXERIL) 10 MG tablet, Take 1 tablet (10 mg total) by mouth 3 (three) times daily as needed for muscle spasms., Disp: 30 tablet, Rfl: 3   EPINEPHrine 0.3 mg/0.3 mL IJ SOAJ injection, Inject 0.3 mLs (0.3 mg total) into the muscle once., Disp: 1 Device, Rfl: 2   gabapentin (NEURONTIN) 300 MG capsule, TAKE 1 CAPSULE(300 MG) BY MOUTH TWICE DAILY, Disp: 60 capsule, Rfl: 3   hydrochlorothiazide (HYDRODIURIL) 12.5 MG tablet, TAKE 1 TABLET(12.5 MG) BY MOUTH DAILY, Disp: 30 tablet, Rfl: 3   hyoscyamine (LEVSIN) 0.125 MG tablet, Take 1 tablet (0.125 mg total) by mouth every 4 (four) hours as needed., Disp: 45 tablet, Rfl: 0   LO LOESTRIN FE 1 MG-10 MCG / 10 MCG tablet, Take 1 tablet by mouth daily., Disp: , Rfl:    loperamide (IMODIUM A-D) 2 MG tablet, Take 1 tablet (2 mg total) by mouth 4 (four) times daily as needed for diarrhea or loose stools (Can use up to 16 mg per day for diarrhea)., Disp: 30 tablet, Rfl: 2   ondansetron (ZOFRAN-ODT) 8 MG disintegrating tablet, Take 1  tablet (8 mg total) by mouth every 8 (eight) hours as needed for nausea or vomiting., Disp: 30 tablet, Rfl: 1   predniSONE (DELTASONE) 20 MG tablet, Take 2 tablets (40 mg total) by mouth daily with breakfast for 2 days, THEN 1.5 tablets (30 mg total) daily with breakfast for 2 days, THEN 1.5 tablets (30 mg total) daily with breakfast for 2 days, THEN 1 tablet (20 mg total) daily with breakfast for 2 days., Disp: 12 tablet, Rfl: 0   UNABLE TO FIND, AG-1 drink mix, Disp: , Rfl:    vitamin C (ASCORBIC ACID) 500 MG tablet, Take 500 mg by mouth 2 (two)  times daily., Disp: , Rfl:   Allergies  Allergen Reactions   Flovent [Fluticasone Propionate]     Difficulty breathing. No issues with Qvar Rxed by Dr Shelle Iron   Fluticasone    Fluticasone Furoate    Mushroom Extract Complex (Obsolete) Swelling    Pt reports hand swelling when consuming mushrooms.    Other     Bee stings and pecan nuts     Objective:   There were no vitals taken for this visit. AAOx3, NAD NCAT, EOMI No obvious CN deficits Coloring WNL Pt is able to speak clearly, coherently without shortness of breath or increased work of breathing.  Thought process is linear.  Mood is appropriate.   Assessment and Plan:   COVID- new.  Pt tested + yesterday but based on symptom duration, she is outside the window for Paxlovid.  Given her SLE and SOB, will extend prednisone taper.  She is to use Albuterol prn.  Cough meds prescribed.  Reviewed supportive care and red flags that should prompt return.  Pt expressed understanding and is in agreement w/ plan.    Neena Rhymes, MD 01/14/2024

## 2024-01-15 ENCOUNTER — Other Ambulatory Visit: Payer: Self-pay | Admitting: Family Medicine

## 2024-01-15 DIAGNOSIS — M62838 Other muscle spasm: Secondary | ICD-10-CM

## 2024-01-16 NOTE — Telephone Encounter (Signed)
 Requested Prescriptions   Pending Prescriptions Disp Refills   cyclobenzaprine (FLEXERIL) 10 MG tablet [Pharmacy Med Name: CYCLOBENZAPRINE 10MG  TABLETS] 30 tablet 3    Sig: TAKE 1 TABLET(10 MG) BY MOUTH THREE TIMES DAILY AS NEEDED FOR MUSCLE SPASMS     Date of patient request: 01/16/2024 Last office visit: 09/29/2023 Upcoming visit: Visit date not found Date of last refill: 12/04/2023 Last refill amount: 30

## 2024-01-22 ENCOUNTER — Encounter: Payer: Self-pay | Admitting: Family Medicine

## 2024-01-22 MED ORDER — OXYCODONE-ACETAMINOPHEN 5-325 MG PO TABS
1.0000 | ORAL_TABLET | ORAL | 0 refills | Status: DC | PRN
Start: 2024-01-22 — End: 2024-02-17

## 2024-01-25 ENCOUNTER — Other Ambulatory Visit: Payer: Self-pay | Admitting: Family Medicine

## 2024-01-25 ENCOUNTER — Other Ambulatory Visit: Payer: Self-pay | Admitting: Physician Assistant

## 2024-01-26 ENCOUNTER — Encounter: Payer: Self-pay | Admitting: Family Medicine

## 2024-01-26 ENCOUNTER — Other Ambulatory Visit: Payer: Self-pay

## 2024-01-26 ENCOUNTER — Encounter (HOSPITAL_BASED_OUTPATIENT_CLINIC_OR_DEPARTMENT_OTHER): Payer: Self-pay

## 2024-01-26 ENCOUNTER — Emergency Department (HOSPITAL_BASED_OUTPATIENT_CLINIC_OR_DEPARTMENT_OTHER): Admission: EM | Admit: 2024-01-26 | Discharge: 2024-01-26 | Disposition: A | Discharge status: Home / Self Care

## 2024-01-26 ENCOUNTER — Ambulatory Visit: Payer: Self-pay | Admitting: Family Medicine

## 2024-01-26 DIAGNOSIS — Z79899 Other long term (current) drug therapy: Secondary | ICD-10-CM | POA: Diagnosis not present

## 2024-01-26 DIAGNOSIS — R109 Unspecified abdominal pain: Secondary | ICD-10-CM | POA: Diagnosis present

## 2024-01-26 DIAGNOSIS — R197 Diarrhea, unspecified: Secondary | ICD-10-CM | POA: Insufficient documentation

## 2024-01-26 DIAGNOSIS — R112 Nausea with vomiting, unspecified: Secondary | ICD-10-CM | POA: Insufficient documentation

## 2024-01-26 DIAGNOSIS — K529 Noninfective gastroenteritis and colitis, unspecified: Secondary | ICD-10-CM

## 2024-01-26 LAB — CBC
HCT: 42 % (ref 36.0–46.0)
Hemoglobin: 14 g/dL (ref 12.0–15.0)
MCH: 29.7 pg (ref 26.0–34.0)
MCHC: 33.3 g/dL (ref 30.0–36.0)
MCV: 89.2 fL (ref 80.0–100.0)
Platelets: 266 10*3/uL (ref 150–400)
RBC: 4.71 MIL/uL (ref 3.87–5.11)
RDW: 13.2 % (ref 11.5–15.5)
WBC: 6 10*3/uL (ref 4.0–10.5)
nRBC: 0 % (ref 0.0–0.2)

## 2024-01-26 LAB — URINALYSIS, ROUTINE W REFLEX MICROSCOPIC
Glucose, UA: NEGATIVE mg/dL
Ketones, ur: NEGATIVE mg/dL
Leukocytes,Ua: NEGATIVE
Nitrite: NEGATIVE
Protein, ur: 100 mg/dL — AB
Specific Gravity, Urine: 1.025 (ref 1.005–1.030)
pH: 7 (ref 5.0–8.0)

## 2024-01-26 LAB — COMPREHENSIVE METABOLIC PANEL
ALT: 24 U/L (ref 0–44)
AST: 27 U/L (ref 15–41)
Albumin: 4 g/dL (ref 3.5–5.0)
Alkaline Phosphatase: 35 U/L — ABNORMAL LOW (ref 38–126)
Anion gap: 14 (ref 5–15)
BUN: 15 mg/dL (ref 6–20)
CO2: 20 mmol/L — ABNORMAL LOW (ref 22–32)
Calcium: 8.7 mg/dL — ABNORMAL LOW (ref 8.9–10.3)
Chloride: 103 mmol/L (ref 98–111)
Creatinine, Ser: 1.05 mg/dL — ABNORMAL HIGH (ref 0.44–1.00)
GFR, Estimated: >60 mL/min (ref >60)
Glucose, Bld: 127 mg/dL — ABNORMAL HIGH (ref 70–99)
Potassium: 3.2 mmol/L — ABNORMAL LOW (ref 3.5–5.1)
Sodium: 137 mmol/L (ref 135–145)
Total Bilirubin: 0.5 mg/dL (ref 0.0–1.2)
Total Protein: 8 g/dL (ref 6.5–8.1)

## 2024-01-26 LAB — URINALYSIS, MICROSCOPIC (REFLEX)

## 2024-01-26 LAB — RESP PANEL BY RT-PCR (RSV, FLU A&B, COVID)  RVPGX2
Influenza A by PCR: NEGATIVE
Influenza B by PCR: NEGATIVE
Resp Syncytial Virus by PCR: NEGATIVE
SARS Coronavirus 2 by RT PCR: NEGATIVE

## 2024-01-26 LAB — PREGNANCY, URINE: Preg Test, Ur: NEGATIVE

## 2024-01-26 LAB — LIPASE, BLOOD: Lipase: 24 U/L (ref 11–51)

## 2024-01-26 MED ORDER — METOCLOPRAMIDE HCL 5 MG/ML IJ SOLN
5.0000 mg | Freq: Once | INTRAMUSCULAR | Status: AC
  Administered 2024-01-26: 5 mg via INTRAVENOUS
  Filled 2024-01-26: qty 2

## 2024-01-26 MED ORDER — SODIUM CHLORIDE 0.9 % IV BOLUS
1000.0000 mL | Freq: Once | INTRAVENOUS | Status: AC
  Administered 2024-01-26: 1000 mL via INTRAVENOUS

## 2024-01-26 MED ORDER — POTASSIUM CHLORIDE CRYS ER 20 MEQ PO TBCR
20.0000 meq | EXTENDED_RELEASE_TABLET | Freq: Once | ORAL | Status: AC
  Administered 2024-01-26: 20 meq via ORAL
  Filled 2024-01-26: qty 1

## 2024-01-26 MED ORDER — PROMETHAZINE HCL 25 MG RE SUPP: 25.0000 mg | Freq: Four times a day (QID) | RECTAL | 0 refills | Status: DC | PRN

## 2024-01-26 MED ORDER — METOCLOPRAMIDE HCL 10 MG PO TABS: 10.0000 mg | ORAL_TABLET | Freq: Three times a day (TID) | ORAL | 0 refills | Status: AC | PRN

## 2024-01-26 MED ORDER — ONDANSETRON 4 MG PO TBDP
4.0000 mg | ORAL_TABLET | Freq: Once | ORAL | Status: AC | PRN
  Administered 2024-01-26: 4 mg via ORAL
  Filled 2024-01-26: qty 1

## 2024-01-26 NOTE — Discharge Instructions (Signed)
 As discussed please follow-up with your primary doctor.  Please try to maintain adequate hydration with frequent sips of clear liquids, low sugar Gatorade.  We are prescribing you nausea meds that she can take as needed.  Return immediately felt fevers, chills, chest pain, shortness of breath, worsening abdominal pain, inability eat or drink due to nausea and vomiting or any new or worsening symptoms that are concerning to you.

## 2024-01-26 NOTE — Telephone Encounter (Signed)
 Chief Complaint: low grade fever Symptoms: low grade fever, vomiting, diarrhea, nausea, abdominal pain, amber colored urine, weakness, numbness and tingling in hands and feet Frequency: since Saturday night Pertinent Negatives: Patient denies neck stiffness, difficulty breathing Disposition: [x] ED /[] Urgent Care (no appt availability in office) / [] Appointment(In office/virtual)/ []  [facility]/ [] Home Care/ [] Refused Recommended Disposition /[] [facility]/ []  Follow-up with PCP Additional Notes: Patient called in stating she has been experiencing a low grade fever since Saturday night, accompanied by vomiting, diarrhea, decreased urine output, dark amber urine, dizziness, weakness, mild numbness and tingling of hands and feet intermittently. Patient referred to ED asap for IV fluids and evaluation. Patient initially stated she did not want to go to ED but after further education from nurse, patient agreed and stated her husband will drive her. Patient advised to follow up with PCP if suggested by ED.   Copied from CRM [id number]. Topic: Clinical - Red Word Triage >> Jan 26, 2024  9:27 AM Kathryne Eriksson wrote: Red Word that prompted transfer to Nurse Triage: Low Grade Fever >> Jan 26, 2024  9:29 AM Kathryne Eriksson wrote: Patient states concerns of possible dehydration, as well as low grade fever and possible virus.  Reason for Disposition  [1] Drinking very little AND [2] dehydration suspected (e.g., no urine > 12 hours, very dry mouth, very lightheaded)  Answer Assessment - Initial Assessment Questions 1. TEMPERATURE: "What is the most recent temperature?"  "How was it measured?"      99.4 - highest it got was last night 100.7 2. ONSET: "When did the fever start?"      Saturday night 3. CHILLS: "Do you have chills?" If yes: "How bad are they?"  (e.g., none, mild, moderate, severe)   - NONE: no chills   - MILD: feeling cold   - MODERATE: feeling very cold, some shivering (feels  better under a thick blanket)   - SEVERE: feeling extremely cold with shaking chills (general body shaking, rigors; even under a thick blanket)      Intermittent 4. OTHER SYMPTOMS: "Do you have any other symptoms besides the fever?"  (e.g., abdomen pain, cough, diarrhea, earache, headache, sore throat, urination pain)     Vomiting, diarrhea, abdominal pain, abnormal urine color, fatigue, dizziness 5. CAUSE: If there are no symptoms, ask: "What do you think is causing the fever?"      Patient is recovering from Covid 2 weeks ago  6. CONTACTS: "Does anyone else in the family have an infection?"     Been exposed to someone with norovirus  7. TREATMENT: "What have you done so far to treat this fever?" (e.g., medications)     Tylenol 8. IMMUNOCOMPROMISE: "Do you have of the following: diabetes, HIV positive, splenectomy, cancer chemotherapy, chronic steroid treatment, transplant patient, etc."     Lupus 10. TRAVEL: "Have you traveled out of the country in the last month?" (e.g., travel history, exposures)       No  Protocols used: [facility]

## 2024-01-26 NOTE — ED Triage Notes (Signed)
 Pt states that she has lupus. Was diagnosed with Covid 1 week ago. States that now she has nausea, vomiting and diarrhea.  Has been sick since Saturday. States that she had zofran last about 4 am this morning.

## 2024-01-26 NOTE — ED Notes (Signed)
 Pt informed that she should not eat or drink anything since she has been vomiting. Pt given PO Zofran for her nausea. Husband very vocal about giving pt water and he said that if she wants it he will give it to her. Pt was leaving the room and husband approached this nurse and told this writer that to me she was a number on the screen. I informed pt husband that the policy is nothing to be drink until seen by doctor. Pt husband is very rude to staff and I informed security.

## 2024-01-26 NOTE — ED Provider Notes (Signed)
 [facility] Provider Note   CSN: [id number] Arrival date & time: 01/26/24  1012     History  Chief Complaint  Patient presents with   Nausea   Abdominal Pain    Lynn Campos is a 54 y.o. female.  This is a 53 year old female presenting emergency department for nausea vomiting diarrhea and crampy abdominal pain.  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  Started feeling unwell Saturday, had nausea vomiting diarrhea overnight.  Crampy abdominal pain.  No fevers no chills no chest pain.   Abdominal Pain      Home Medications Prior to Admission medications   Medication Sig Start Date End Date Taking? Authorizing Provider  acetaminophen (TYLENOL 8 HOUR) 650 MG CR tablet Take 1 tablet (650 mg total) by mouth every 8 (eight) hours as needed for pain. 01/07/24   Nche, Bonna Gains, NP  ALPRAZolam (XANAX) 0.25 MG tablet TAKE 1 TABLET(0.25 MG) BY MOUTH TWICE DAILY AS NEEDED FOR ANXIETY 10/29/23   Sheliah Hatch, MD  calcium carbonate (OSCAL) 1500 (600 Ca) MG TABS tablet Take by mouth 2 (two) times daily with a meal.    [provider]  cyclobenzaprine (FLEXERIL) 10 MG tablet TAKE 1 TABLET(10 MG) BY MOUTH THREE TIMES DAILY AS NEEDED FOR MUSCLE SPASMS 01/16/24   Sheliah Hatch, MD  EPINEPHrine 0.3 mg/0.3 mL IJ SOAJ injection Inject 0.3 mLs (0.3 mg total) into the muscle once. 04/21/15   Sheliah Hatch, MD  gabapentin (NEURONTIN) 300 MG capsule TAKE 1 CAPSULE(300 MG) BY MOUTH TWICE DAILY 01/26/24   Sheliah Hatch, MD  hydrochlorothiazide (HYDRODIURIL) 12.5 MG tablet TAKE 1 TABLET(12.5 MG) BY MOUTH DAILY 11/28/23   Sheliah Hatch, MD  hyoscyamine (LEVSIN) 0.125 MG tablet Take 1 tablet (0.125 mg total) by mouth every 4 (four) hours as needed. 06/16/23   Sheliah Hatch, MD  LO LOESTRIN FE 1 MG-10 MCG / 10 MCG tablet Take 1 tablet by mouth daily. 03/10/23   [provider]  loperamide (IMODIUM A-D) 2 MG tablet Take 1 tablet (2 mg total) by mouth 4 (four) times daily as needed for diarrhea or loose stools (Can use up to 16 mg per day for diarrhea). 05/20/23   Imogene Burn, MD  ondansetron (ZOFRAN-ODT) 8 MG disintegrating tablet Take 1 tablet (8 mg total) by mouth every 8 (eight) hours as needed for nausea or vomiting. 05/20/23   Imogene Burn, MD  oxyCODONE-acetaminophen (PERCOCET/ROXICET) 5-325 MG tablet Take 1 tablet by mouth every 4 (four) hours as needed. Dx M54.2 01/22/24   Sheliah Hatch, MD  predniSONE (DELTASONE) 10 MG tablet 3 tabs x3 days and then 2 tabs x3 days and then 1 tab x3 days.  Take w/ food. 01/14/24   Sheliah Hatch, MD  promethazine-dextromethorphan (PROMETHAZINE-DM) 6.25-15 MG/5ML syrup Take 5 mLs by mouth 4 (four) times daily as needed. 01/14/24   Sheliah Hatch, MD  UNABLE TO FIND AG-1 drink mix    [provider]  vitamin C (ASCORBIC ACID) 500 MG tablet Take 500 mg by mouth 2 (two) times daily.    [provider]      Allergies    Flovent [fluticasone propionate], Fluticasone, Fluticasone furoate, Mushroom extract complex (obsolete), and Other    Review of Systems   Review of Systems  Gastrointestinal:  Positive for abdominal pain.    Physical Exam Updated Vital Signs BP Marland Kitchen)  161/93 (BP Location: Left Arm)   Pulse (!) 110   Temp 99 F (37.2 C)   Resp 19   SpO2 100%  Physical Exam Vitals and nursing note reviewed.  Constitutional:      General: She is not in acute distress.    Appearance: She is not toxic-appearing.  HENT:     Head: Normocephalic.  Cardiovascular:     Rate and Rhythm: Normal rate and regular rhythm.  Pulmonary:     Effort: Pulmonary effort is normal.  Abdominal:     General: Abdomen is flat.     Tenderness: There is no abdominal tenderness. There is no guarding or rebound.  Skin:    General: Skin is warm and dry.     Capillary Refill: Capillary refill takes  less than 2 seconds.  Neurological:     Mental Status: She is alert and oriented to person, place, and time.  Psychiatric:        Mood and Affect: Mood normal.        Behavior: Behavior normal.     ED Results / Procedures / Treatments   Labs (all labs ordered are listed, but only abnormal results are displayed) Labs Reviewed  COMPREHENSIVE METABOLIC PANEL - Abnormal; Notable for the following components:      Result Value   Potassium 3.2 (*)    CO2 20 (*)    Glucose, Bld 127 (*)    Creatinine, Ser 1.05 (*)    Calcium 8.7 (*)    Alkaline Phosphatase 35 (*)    All other components within normal limits  URINALYSIS, ROUTINE W REFLEX MICROSCOPIC - Abnormal; Notable for the following components:   Hgb urine dipstick SMALL (*)    Bilirubin Urine SMALL (*)    Protein, ur 100 (*)    All other components within normal limits  URINALYSIS, MICROSCOPIC (REFLEX) - Abnormal; Notable for the following components:   Bacteria, UA RARE (*)    All other components within normal limits  RESP PANEL BY RT-PCR (RSV, FLU A&B, COVID)  RVPGX2  LIPASE, BLOOD  CBC  PREGNANCY, URINE    EKG None  Radiology No results found.  Procedures Procedures    Medications Ordered in ED Medications  ondansetron (ZOFRAN-ODT) disintegrating tablet 4 mg (4 mg Oral Given 01/26/24 1053)  metoCLOPramide (REGLAN) injection 5 mg (5 mg Intravenous Given 01/26/24 1147)  sodium chloride 0.9 % bolus 1,000 mL (0 mLs Intravenous Stopped 01/26/24 1244)  potassium chloride SA (KLOR-CON M) CR tablet 20 mEq (20 mEq Oral Given 01/26/24 1250)    ED Course/ Medical Decision Making/ A&P                                 Medical Decision Making This is a well-appearing 53 year old female presenting emergency department for viral gastroenteritis type picture.  She is afebrile, slightly elevated heart rate on arrival, improved with IV fluids.  History concerning for viral etiology.  However flu/COVID/RSV negative.  Treated  supportively with IV fluids and Reglan with improvement of her symptoms.  No further vomiting here in emergency department.  Tolerating p.o.  Lab work with no leukocytosis to suggest systemic infection.  Mild hypokalemia, but no other significant abnormalities.  No AKI.  No transaminitis to suggest hepatobiliary disease.  UA without evidence of urinary tract infection.  Pregnancy test negative.  Lipase normal.  Pancreatitis unlikely.  Considered CT abdomen, however with strong suspicion for viral etiology,  reassuring labs and not tender abdominal exam.  Low suspicion for acute surgical pathology.  Patient stable for discharge  Amount and/or Complexity of Data Reviewed Independent Historian:     Details: Spouse notes taking Zofran earlier External Data Reviewed:     Details: Does have a history of lupus, appendectomy and cholecystectomy per chart review Labs: ordered. Decision-making details documented in ED Course. Radiology:  Decision-making details documented in ED Course. ECG/medicine tests: ordered.  Risk Prescription drug management. Decision regarding hospitalization.         Final Clinical Impression(s) / ED Diagnoses Final diagnoses:  None    Rx / DC Orders ED Discharge Orders     None         Coral Spikes, DO 01/26/24 1332

## 2024-01-28 ENCOUNTER — Ambulatory Visit: Payer: 59

## 2024-02-03 ENCOUNTER — Telehealth (INDEPENDENT_AMBULATORY_CARE_PROVIDER_SITE_OTHER): Admitting: Family Medicine

## 2024-02-03 ENCOUNTER — Encounter: Payer: Self-pay | Admitting: Family Medicine

## 2024-02-03 DIAGNOSIS — K529 Noninfective gastroenteritis and colitis, unspecified: Secondary | ICD-10-CM

## 2024-02-03 DIAGNOSIS — R7309 Other abnormal glucose: Secondary | ICD-10-CM | POA: Diagnosis not present

## 2024-02-03 DIAGNOSIS — R61 Generalized hyperhidrosis: Secondary | ICD-10-CM | POA: Diagnosis not present

## 2024-02-03 NOTE — Progress Notes (Signed)
 Virtual Visit via Video   I connected with patient on 02/03/24 at 11:00 AM EDT by a video enabled telemedicine application and verified that I am speaking with the correct person using two identifiers.  Location patient: Home Location provider: [facility], Office Persons participating in the virtual visit: Patient, Provider, CMA Archie Patten H)  I discussed the limitations of evaluation and management by telemedicine and the availability of in person appointments. The patient expressed understanding and agreed to proceed.  Subjective:   HPI:   ER f/u- went to ER on 3/3 w/ viral gastroenteritis.  Was tx'd w/ IVF and Reglan and sxs improved.  WBC was normal.  She had mildly low K+ at 3.2 and was treated w/ in ER.  Today pt reports feeling much better.  She has been checking her blood sugar w/ her husband's supplies (he's diabetic).  Sugars typically run 80s-90s.  Since being sick, sugars have been rising.  Today CBG 135 fasting which is definitely new for pt.    Night sweats- sees Dr Rana Snare (GYN).  They have discussed starting estrogen patch but she is not sure how to proceed.  ROS:   See pertinent positives and negatives per HPI.  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   Anxiety state 08/14/2010   ROSACEA 07/25/2009   MITRAL REGURGITATION 02/18/2009   GERD 02/18/2009   LUPUS 02/18/2009   PALPITATIONS 02/18/2009    Social History   Tobacco Use   Smoking status: Never   Smokeless tobacco: Never  Substance  Use Topics   Alcohol use: Yes    Comment: special occasions    Current Outpatient Medications:    acetaminophen (TYLENOL 8 HOUR) 650 MG CR tablet, Take 1 tablet (650 mg total) by mouth every 8 (eight) hours as needed for pain., Disp: , Rfl:    ALPRAZolam (XANAX) 0.25 MG tablet, TAKE 1 TABLET(0.25 MG) BY MOUTH TWICE DAILY AS NEEDED FOR ANXIETY, Disp: 30 tablet, Rfl: 3   calcium carbonate (OSCAL) 1500 (600 Ca) MG TABS tablet, Take by mouth 2 (two) times daily with a meal., Disp: , Rfl:    cyclobenzaprine (FLEXERIL) 10 MG tablet, TAKE 1 TABLET(10 MG) BY MOUTH THREE TIMES DAILY AS NEEDED FOR MUSCLE SPASMS, Disp: 30 tablet, Rfl: 3   EPINEPHrine 0.3 mg/0.3 mL IJ SOAJ injection, Inject 0.3 mLs (0.3 mg total) into the muscle once., Disp: 1 Device, Rfl: 2   gabapentin (NEURONTIN) 300 MG capsule, TAKE 1 CAPSULE(300 MG) BY MOUTH TWICE DAILY, Disp: 60 capsule, Rfl: 3   hydrochlorothiazide (HYDRODIURIL) 12.5 MG tablet, TAKE 1 TABLET(12.5 MG) BY MOUTH DAILY, Disp: 30 tablet, Rfl: 3   hyoscyamine (LEVSIN) 0.125 MG tablet, Take 1 tablet (0.125 mg total) by mouth every 4 (four) hours as needed., Disp: 45 tablet, Rfl: 0   LO LOESTRIN FE 1 MG-10 MCG / 10 MCG tablet, Take 1 tablet by mouth daily., Disp: , Rfl:    loperamide (IMODIUM A-D) 2 MG tablet, Take 1 tablet (2 mg total) by mouth 4 (four) times daily as needed for  diarrhea or loose stools (Can use up to 16 mg per day for diarrhea)., Disp: 30 tablet, Rfl: 2   metoCLOPramide (REGLAN) 10 MG tablet, Take 1 tablet (10 mg total) by mouth every 8 (eight) hours as needed for up to 3 days for nausea., Disp: 9 tablet, Rfl: 0   ondansetron (ZOFRAN-ODT) 8 MG disintegrating tablet, Take 1 tablet (8 mg total) by mouth every 8 (eight) hours as needed for nausea or vomiting., Disp: 30 tablet, Rfl: 1   oxyCODONE-acetaminophen (PERCOCET/ROXICET) 5-325 MG tablet, Take 1 tablet by mouth every 4 (four) hours as needed. Dx M54.2, Disp: 30 tablet, Rfl: 0   predniSONE (DELTASONE) 10  MG tablet, 3 tabs x3 days and then 2 tabs x3 days and then 1 tab x3 days.  Take w/ food., Disp: 18 tablet, Rfl: 0   promethazine (PHENERGAN) 25 MG suppository, Place 1 suppository (25 mg total) rectally every 6 (six) hours as needed for up to 3 days for nausea or vomiting., Disp: 9 each, Rfl: 0   promethazine-dextromethorphan (PROMETHAZINE-DM) 6.25-15 MG/5ML syrup, Take 5 mLs by mouth 4 (four) times daily as needed., Disp: 180 mL, Rfl: 0   UNABLE TO FIND, AG-1 drink mix, Disp: , Rfl:    vitamin C (ASCORBIC ACID) 500 MG tablet, Take 500 mg by mouth 2 (two) times daily., Disp: , Rfl:   Allergies  Allergen Reactions   Flovent [Fluticasone Propionate]     Difficulty breathing. No issues with Qvar Rxed by Dr Shelle Iron   Fluticasone    Fluticasone Furoate    Mushroom Extract Complex (Obsolete) Swelling    Pt reports hand swelling when consuming mushrooms.    Other     Bee stings and pecan nuts     Objective:   There were no vitals taken for this visit. AAOx3, NAD NCAT, EOMI No obvious CN deficits Coloring WNL Pt is able to speak clearly, coherently without shortness of breath or increased work of breathing.  Thought process is linear.  Mood is appropriate.   Assessment and Plan:  Gastroenteritis- resolving.  Reviewed ER notes and labs.  Pt feels much better.  Is drinking fluids and eating again.  No further workup needed.  Elevated glucose- new.  Discussed that this is likely due to the physical stress of her recent illness.  Encouraged her to periodically check CBGs and if not returning to baseline in a few weeks, she is to let me know.  Pt expressed understanding and is in agreement w/ plan.   Night sweats- new to provider, ongoing for pt.  She spoke w/ GYN and was told that they would prescribe and estrogen patch 'when it was time'.  She reports she is waking up multiple times nightly to change her clothes.  Encouraged her to reach out to GYN and if she did not get the help she needs to  let me know.  Pt expressed understanding and is in agreement w/ plan.    Neena Rhymes, MD 02/03/2024

## 2024-02-04 ENCOUNTER — Telehealth: Payer: Self-pay

## 2024-02-04 ENCOUNTER — Ambulatory Visit

## 2024-02-04 NOTE — Addendum Note (Signed)
 Addended by: Eldred Manges on: 02/04/2024 02:24 PM   Modules accepted: Orders

## 2024-02-04 NOTE — Telephone Encounter (Signed)
 Pt arrived to the office for a NV to receive B12 injection. Gaspar Cola, CMA came to verify with me that the number of doses "ordered" by PCP have already been completed. PCP wrote for monthly B12 for 6 months, which patient already received all 6 injections. Pt had a VV with PCP yesterday, nothing addressed or noted regarding B12 injections.   Entered the room and introduced myself. Explained to Ms.Lynn Campos that because the "order" placed by her PCP has been completed we would not be able to administer her injection today and in order for [place] to continue the injections, we would need an order from her provider. Advised the pt that I was sending a message to her PCP to ask if she wishes for the pt to continue monthly injections.   Pt states, "I really wish yall would have worked on this with me sooner. I've been waiting." (Pt received a reminder that her appt was at 1:25. Pt's NV is scheduled for 1:40) I apologized to the pt and explained to her the Lynn Campos team is working in the background to have this issue rectified. Pt asked, "why can't I just get it, what could possibly happen from me getting a B12 shot?" I explained to the pt that we were not authorized to administer the injection since we do not have an order and if something were to happen, we would be liable. Pt states, "But I am asking you what could happen?" I told the pt there are risks with anything and that I was not sure specifically what the side effects of too much B12 could be. Pt states, "I am just asking because my daughter is in pharmaceuticals and has done research, she said all you do is pee it out so I'm not understanding and I am just asking since you are a nurse because I feel like you're getting an attitude because I'm asking."   I apologized to the pt for the scheduling reminder error and for [place] not being able to administer her injection today. I told her she will receive a phone call or MC message from [place] once we hear from her PCP to get her  scheduled for a NV if we receive an order. Pt asked could I not call her PCP. I asked when did she need to leave, as she previously stated she had another appointment to go to, pt said within the next 6 minutes. I advised that when I called the office number, I would get the contact center and they would either send a message or attempt to transfer me but I couldn't guarantee I would get an answer within 6 minutes. Pt decided to leave. I apologized again and walked pt out.

## 2024-02-04 NOTE — Telephone Encounter (Signed)
 Patient has been informed.

## 2024-02-04 NOTE — Telephone Encounter (Signed)
 Depending on her level, we can most likely continue the injections.  But I want to verify before I say for certain

## 2024-02-04 NOTE — Telephone Encounter (Signed)
 Thank you for the update.  Time flies!  We will need to repeat the B12 levels at her next office visit (or lab visit) to see if more injections are needed.

## 2024-02-04 NOTE — Telephone Encounter (Signed)
 Called patient and set up lab visit for tomorrow, she does want to know if she can continue Vit B12 injection even if her levels are okay as she feels this has helped with her energy but also her neuropathy.   Please advise

## 2024-02-05 ENCOUNTER — Other Ambulatory Visit (INDEPENDENT_AMBULATORY_CARE_PROVIDER_SITE_OTHER)

## 2024-02-05 ENCOUNTER — Encounter: Payer: Self-pay | Admitting: Family Medicine

## 2024-02-05 DIAGNOSIS — R61 Generalized hyperhidrosis: Secondary | ICD-10-CM

## 2024-02-05 DIAGNOSIS — E538 Deficiency of other specified B group vitamins: Secondary | ICD-10-CM | POA: Diagnosis not present

## 2024-02-05 NOTE — Telephone Encounter (Signed)
 Patient is wanting to add TSH lab orders with B12 lab for today. Please advise.

## 2024-02-06 LAB — LUTEINIZING HORMONE: LH: 9.19 m[IU]/mL

## 2024-02-06 LAB — B12 AND FOLATE PANEL
Folate: 19.3 ng/mL (ref >5.9)
Vitamin B-12: 560 pg/mL (ref 211–911)

## 2024-02-06 LAB — FOLLICLE STIMULATING HORMONE: FSH: 16.6 m[IU]/mL

## 2024-02-09 ENCOUNTER — Encounter: Payer: Self-pay | Admitting: Family Medicine

## 2024-02-17 ENCOUNTER — Other Ambulatory Visit: Payer: Self-pay | Admitting: Family Medicine

## 2024-02-17 NOTE — Telephone Encounter (Signed)
 Requested Prescriptions   Pending Prescriptions Disp Refills   oxyCODONE-acetaminophen (PERCOCET/ROXICET) 5-325 MG tablet 30 tablet 0    Sig: Take 1 tablet by mouth every 4 (four) hours as needed. Dx M54.2     Date of patient request: 02/17/2024 Last office visit: 09/29/2023 Upcoming visit: Visit date not found Date of last refill: 01/22/2024 Last refill amount: 30

## 2024-02-18 MED ORDER — OXYCODONE-ACETAMINOPHEN 5-325 MG PO TABS: 1.0000 | ORAL_TABLET | ORAL | 0 refills | Status: DC | PRN

## 2024-03-04 ENCOUNTER — Ambulatory Visit

## 2024-03-04 DIAGNOSIS — E538 Deficiency of other specified B group vitamins: Secondary | ICD-10-CM

## 2024-03-04 MED ORDER — CYANOCOBALAMIN 1000 MCG/ML IJ SOLN
1000.0000 ug | Freq: Once | INTRAMUSCULAR | Status: AC
  Administered 2024-03-04: 1000 ug via INTRAMUSCULAR

## 2024-03-04 NOTE — Progress Notes (Signed)
 Patient is in office today for a nurse visit for B12 Injection. Patient Injection was given in the  Right deltoid. Patient tolerated injection well.  Next B12 injection (1 month) 04/03/2024

## 2024-03-15 ENCOUNTER — Other Ambulatory Visit: Payer: Self-pay | Admitting: Student in an Organized Health Care Education/Training Program

## 2024-03-15 ENCOUNTER — Other Ambulatory Visit: Payer: Self-pay | Admitting: Family Medicine

## 2024-03-15 DIAGNOSIS — F411 Generalized anxiety disorder: Secondary | ICD-10-CM

## 2024-03-15 MED ORDER — OXYCODONE-ACETAMINOPHEN 5-325 MG PO TABS: 1.0000 | ORAL_TABLET | ORAL | 0 refills | Status: DC | PRN

## 2024-03-17 ENCOUNTER — Ambulatory Visit (INDEPENDENT_AMBULATORY_CARE_PROVIDER_SITE_OTHER): Admitting: Family Medicine

## 2024-03-17 ENCOUNTER — Encounter: Payer: Self-pay | Admitting: Family Medicine

## 2024-03-17 ENCOUNTER — Ambulatory Visit (HOSPITAL_BASED_OUTPATIENT_CLINIC_OR_DEPARTMENT_OTHER)
Admission: RE | Admit: 2024-03-17 | Discharge: 2024-03-17 | Disposition: A | Discharge status: Home / Self Care | Source: Ambulatory Visit | Attending: Family Medicine | Admitting: Family Medicine

## 2024-03-17 ENCOUNTER — Other Ambulatory Visit: Payer: Self-pay | Admitting: Family Medicine

## 2024-03-17 ENCOUNTER — Ambulatory Visit: Payer: Self-pay

## 2024-03-17 ENCOUNTER — Encounter (HOSPITAL_BASED_OUTPATIENT_CLINIC_OR_DEPARTMENT_OTHER): Payer: Self-pay

## 2024-03-17 VITALS — BP 124/78 | HR 86 | Temp 98.1°F | Ht 64.0 in | Wt 193.6 lb

## 2024-03-17 DIAGNOSIS — Z981 Arthrodesis status: Secondary | ICD-10-CM

## 2024-03-17 DIAGNOSIS — G44319 Acute post-traumatic headache, not intractable: Secondary | ICD-10-CM | POA: Insufficient documentation

## 2024-03-17 DIAGNOSIS — M542 Cervicalgia: Secondary | ICD-10-CM

## 2024-03-17 NOTE — Telephone Encounter (Signed)
  Chief Complaint: head injury Symptoms: intermittent headaches, right eye injury (as told by her ophthalmologist) Frequency: last friday Pertinent Negatives: Patient denies LOC, cut on head, vomiting, vision changes, neuro changes Disposition: [] ED /[] Urgent Care (no appt availability in office) / [x] Appointment(In office/virtual)/ []  [facility]/ [] Home Care/ [] Refused Recommended Disposition /[] [facility]/ []  Follow-up with PCP Additional Notes: Patient reports she was hit in the head by a ladder last Friday. Patient reports she has had intermittent headaches since then, but denies any LOC, neuro changes, vision changes, or vomiting. Patient reports she did see an ophthalmologist a few days ago as a routine visit, who told her that she had an injury to her right eye that was in the stages of healing, but patient denies ever experiencing any vision changes or any visible injury to her eye, so is unsure if that is related to this injury. Per protocol, appt scheduled today in office 03/17/24. Patient advised to call back with worsening symptoms. Patient verbalized understanding.      Reason for Disposition  [1] After 72 hours AND [2] headache persists  Answer Assessment - Initial Assessment Questions 1. MECHANISM: "How did the injury happen?" For falls, ask: "What height did you fall from?" and "What surface did you fall against?"      Ladder fell on head 2. ONSET: "When did the injury happen?" (Minutes or hours ago)      Last friday 3. NEUROLOGIC SYMPTOMS: "Was there any loss of consciousness?" "Are there any other neurological symptoms?"      loc 4. MENTAL STATUS: "Does the person know who they are, who you are, and where they are?"      denies 5. LOCATION: "What part of the head was hit?"      back 6. SCALP APPEARANCE: "What does the scalp look like? Is it bleeding now?" If Yes, ask: "Is it difficult to stop?"      Bump but no cuts 7. SIZE: For cuts, bruises, or  swelling, ask: "How large is it?" (e.g., inches or centimeters)      denies 8. PAIN: "Is there any pain?" If Yes, ask: "How bad is it?"  (e.g., Scale 1-10; or mild, moderate, severe)     denies 9. TETANUS: For any breaks in the skin, ask: "When was the last tetanus booster?"     denies 10. OTHER SYMPTOMS: "Do you have any other symptoms?" (e.g., neck pain, vomiting)       Headaches intermittent  Protocols used: Head Injury-A-AH

## 2024-03-17 NOTE — Telephone Encounter (Signed)
 Agent transferred patient to Nurse Triage. Started call with patient and phone dropped. Attempted to call patient back-had to leave a message for patient to call back to speak with Nurse Triage.  Will route to callbacks.   Copied from CRM [id number]. Topic: Clinical - Red Word Triage >> Mar 17, 2024  8:30 AM Lynn Campos wrote: [name] that prompted transfer to Nurse Triage:  Patient called this morning stating that last Friday she was painting the exterior of her house with her husband when a heavy-duty commercial-grade ladder fell from a significant height and struck her on the head. She did not lose consciousness. 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

## 2024-03-17 NOTE — Telephone Encounter (Signed)
 FYI

## 2024-03-17 NOTE — Patient Instructions (Addendum)
 Thank you for coming in today.  I have ordered a CT scan of the head as well as an x-ray of your neck to be performed today and we will be contacting you once we have that appointment scheduled.  I do recommend contacting the neurosurgeon regarding the neck pain, but I am checking an x-ray today.  Suspect physical therapy may be helpful for your neck but again I would like to check the imaging as above first.  Hang in there.  Return to the clinic or go to the nearest emergency room if any of your symptoms worsen or new symptoms occur.   Head Injury, Adult There are many types of head injuries. Head injuries can be as minor as a small bump, or they can be a serious medical issue. More severe head injuries include: A jarring injury to the brain (concussion). A bruise (contusion) of the brain. This means there is bleeding in the brain that can cause swelling. A cracked skull (skull fracture). Bleeding in the brain that collects, clots, and forms a bump (hematoma). After a head injury, most problems occur within the first 24 hours, but side effects may occur up to 7-10 days after the injury. It is important to watch your condition for any changes. You may need to be observed in the emergency department or urgent care, or you may have to stay in the hospital. What are the causes? There are many causes of a head injury. Serious head injuries may be caused by car crashes, bicycle or motorcycle crashes, sports injuries, falls, or being struck by an object. What are the symptoms? Symptoms of a head injury include a contusion, bump, or bleeding at the site of the injury. Other physical symptoms may include: Headache. Nausea or vomiting. Dizziness. Blurred or double vision. Sensitivity to bright lights or loud noises. Feeling tired. Trouble waking up. Severe symptoms such as: Weakness or numbness on one side of the body. Slurred speech or swallowing problems. Loss of consciousness. Seizures. Mental  symptoms may include: Irritability. Confusion and memory problems. Poor attention and concentration. Changes in eating or sleeping habits. Anxiety or depression. How is this diagnosed? This condition is diagnosed based on your symptoms and a physical exam. You may also have imaging tests done, such as a CT scan or an MRI. How is this treated? Treatment for this condition depends on the severity and type of injury you have. The main goal of treatment is to prevent complications and allow the brain time to heal. Mild head injury If you have a mild head injury, you may be sent home, and treatment may include: Observation. A responsible adult should stay with you for 24 hours after your injury and check on you often. Physical rest. Brain rest. Pain medicines. Severe head injury If you have a severe head injury, treatment may include: Close observation. You may have to stay in the hospital and have: Frequent physical exams. Frequent checks of how your brain and nervous system are working. Your blood pressure and oxygen levels checked. Medicines to relieve pain, prevent seizures, and decrease brain swelling. Airway protection and breathing support. This may include using a ventilator. Monitoring and managing swelling inside the brain. Brain surgery. Surgery may include: Removing a collection of blood or blood clots. Stopping the bleeding. Removing a part of the skull to make room for the brain to swell. Follow these instructions at home: Activity Rest. Avoid activities that are hard or tiring. Make sure you get enough sleep. Let your  brain rest by limiting activities that take a lot of thought or attention, such as: Watching TV. Playing memory games and doing puzzles. Job-related work or homework. Working on [employer], [employer], and texting. Avoid activities that could cause another head injury, such as playing sports, until your health care provider approves. Ask your  provider when it is safe for you to return to your regular activities, such as work or school. Ask your provider when you can drive, ride a bicycle, or use machinery. Your ability to react may be slower after a brain injury. Do not do these activities if you are dizzy. Lifestyle  Do not drink alcohol until your provider approves. Do not use drugs. Alcohol and certain drugs may slow your recovery and can put you at risk of further injury. If it is hard to remember things, write them down. If you are easily distracted, try to do one thing at a time. Talk with family members or close friends when making important decisions. Tell your friends, family, a trusted colleague, and work Production designer, theatre/television/film about your injury, symptoms, and restrictions. Ask them to watch for any problems that are new or get worse. General instructions Take over-the-counter and prescription medicines only as told by your provider. Have a responsible adult stay with you for 24 hours after your head injury. They should watch you for any changes in your symptoms and be ready to get help right away. Keep all follow-up visits to make sure your needs are being met and catch any new problems early. How is this prevented? Avoiding another brain injury is very important. In rare cases, another injury can lead to permanent brain damage, brain swelling, or death. The risk of this is greatest during the first 7-10 days after a head injury. To avoid injuries: Improve your balance and strength to avoid falls. Wear a seat belt when you are in a moving vehicle. Wear a helmet when riding a bicycle, skiing, or doing any other sport that has a risk of injury. Take safety measures in your home to prevent falls, such as: Removing clutter and tripping hazards. Using grab bars in bathrooms and handrails by stairs. Placing non-slip mats on floors and in bathtubs. Improving lighting in dim areas. Where to find more information Brain Injury Association:  biausa.org Contact a health care provider if: You have headaches that do not go away. You have dizziness that does not go away. You have double vision or vision changes that do not go away. You have difficulty sleeping. You have changes in your mood. You have new symptoms. Get help right away if: You have sudden: Severe headache. Severe vomiting. Unequal pupil size. One is bigger than the other. Vision problems. Confusion or irritability. You have a seizure. Your symptoms get worse. You have clear or bloody fluid coming from your nose or ears. These symptoms may be an emergency. Get help right away. Call 911. Do not wait to see if the symptoms will go away. Do not drive yourself to the hospital. This information is not intended to replace advice given to you by your health care provider. Make sure you discuss any questions you have with your health care provider. Document Revised: 08/29/2022 Document Reviewed: 08/29/2022 Elsevier Patient Education  2024 [employer].

## 2024-03-17 NOTE — Progress Notes (Signed)
 Subjective:  Patient ID: Lynn Campos, female    DOB: 1971-09-10  Age: 53 y.o. MRN: [id number]  CC:  Chief Complaint  Patient presents with   Headache    Pt notes has been having headaches and notes ladder fell and hit her head Friday, plus bleeding in her optic nerve per her eye dr yesterday afternoon     HPI Lynn Campos presents for acute visit for above.  PCP is Jess Morita, MD   Headache:  Here with husband.  Painting exterior of house 4 days ago - moving extension ladder - it fell back and hit top of head. Small scrape. Noticed darkening of vision immediately, then cleared in few seconds.  No LOC.  Swelling on top of scalp immediately.  Prior cervical fusion (plate with V9-5, C6-7 fusions) felt lightning shock sensation down neck into both arms and into shoulder blades when ladder hit.  Headache started immediately - persistent HA since that time, but improved. Worse pain with bending forward, and notices darkening of vision with leaning forward, then improves when standing. HA on top of head.  Swelling on scalp improved.  Still having neck pain, with radiating pain into neck and shoulder blades. Has had in past with her neck issues, but more than prior.  Saw optho for routine appt yesterday. Was told that one of her pupils was not responding appropriately. Optho noted bleeding of retina, but already starting to reabsorb. Plan for repeat exam in next few weeks. Unsure which side. No vomiting. Slight nausea. R hand thumb joint when ladder fell, but better and no extremity weakness.   Tx: ibuprofen 800mg  every 4-6 hours, tylenol , 2 percocet initially - now 2-3 per day for HA, neck pain.   History Patient Active Problem List   Diagnosis Date Noted   B12 deficiency 08/27/2023   HTN (hypertension) 09/09/2022   Osteoarthritis of left knee 05/31/2020   Obesity (BMI 30-39.9) 03/29/2019   Encounter for chronic pain management 10/05/2018   Cervicalgia 06/29/2018   Screening for  malignant neoplasm of cervix 09/20/2013   Physical exam 09/20/2013   S/P cervical discectomy 06/25/2013   Trapezius muscle spasm 12/11/2012   Shoulder pain 12/11/2012   Keratosis 11/19/2012   Asthma, intrinsic 10/14/2011   Anxiety state 08/14/2010   ROSACEA 07/25/2009   MITRAL REGURGITATION 02/18/2009   GERD 02/18/2009   LUPUS 02/18/2009   PALPITATIONS 02/18/2009   Past Medical History:  Diagnosis Date   Anemia    in the past   Anxiety    Arthritis    neck   Asthma    uses inhaler PRN   Dyspnea    GERD (gastroesophageal reflux disease)    Heart murmur    Hyperlipidemia    diet controlled   Hypertension    Lupus    Mitral regurgitation    Palpitations    Rosacea    Seasonal allergies    Shingles    Tuberculosis 2012   medication x 1 month for treatment   Past Surgical History:  Procedure Laterality Date   APPENDECTOMY     CERVICAL ABLATION  2022   with cerivcal cyst removed   CERVICAL SPINE SURGERY  01/2010   with plate placed   CHOLECYSTECTOMY  05/2010   ROTATOR CUFF REPAIR Left    SIGMOIDOSCOPY  2016   Dr. Grandville Lax   Allergies  Allergen Reactions   Flovent  [Fluticasone  Propionate]     Difficulty breathing. No issues with Qvar  Rxed by Dr Bennetta Braun  Fluticasone     Fluticasone  Furoate    Mushroom Extract Complex (Obsolete) Swelling    Pt reports hand swelling when consuming mushrooms.    Other     Bee stings and pecan nuts    Prior to Admission medications   Medication Sig Start Date End Date Taking? Authorizing Provider  acetaminophen  (TYLENOL  8 HOUR) 650 MG CR tablet Take 1 tablet (650 mg total) by mouth every 8 (eight) hours as needed for pain. 01/07/24  Yes Nche, Connye Delaine, NP  ALPRAZolam  (XANAX ) 0.25 MG tablet TAKE 1 TABLET(0.25 MG) BY MOUTH TWICE DAILY AS NEEDED FOR ANXIETY 03/15/24  Yes Tabori, Katherine E, MD  calcium carbonate (OSCAL) 1500 (600 Ca) MG TABS tablet Take by mouth 2 (two) times daily with a meal.   Yes [provider]   cyclobenzaprine  (FLEXERIL ) 10 MG tablet TAKE 1 TABLET(10 MG) BY MOUTH THREE TIMES DAILY AS NEEDED FOR MUSCLE SPASMS 01/16/24  Yes Jess Morita, MD  EPINEPHrine  0.3 mg/0.3 mL IJ SOAJ injection Inject 0.3 mLs (0.3 mg total) into the muscle once. 04/21/15  Yes Jess Morita, MD  gabapentin  (NEURONTIN ) 300 MG capsule TAKE 1 CAPSULE(300 MG) BY MOUTH TWICE DAILY 01/26/24  Yes Tabori, Katherine E, MD  hydrochlorothiazide  (HYDRODIURIL ) 12.5 MG tablet TAKE 1 TABLET(12.5 MG) BY MOUTH DAILY 11/28/23  Yes Tabori, Katherine E, MD  hyoscyamine  (LEVSIN) 0.125 MG tablet Take 1 tablet (0.125 mg total) by mouth every 4 (four) hours as needed. 06/16/23  Yes Tabori, Katherine E, MD  LO LOESTRIN FE 1 MG-10 MCG / 10 MCG tablet Take 1 tablet by mouth daily. 03/10/23  Yes [provider]  loperamide  (IMODIUM  A-D) 2 MG tablet Take 1 tablet (2 mg total) by mouth 4 (four) times daily as needed for diarrhea or loose stools (Can use up to 16 mg per day for diarrhea). 05/20/23  Yes Daina Drum, MD  ondansetron  (ZOFRAN -ODT) 8 MG disintegrating tablet Take 1 tablet (8 mg total) by mouth every 8 (eight) hours as needed for nausea or vomiting. 05/20/23  Yes Daina Drum, MD  oxyCODONE -acetaminophen  (PERCOCET/ROXICET) 5-325 MG tablet Take 1 tablet by mouth every 4 (four) hours as needed. Dx M54.2 03/15/24  Yes Tabori, Katherine E, MD  predniSONE  (DELTASONE ) 10 MG tablet 3 tabs x3 days and then 2 tabs x3 days and then 1 tab x3 days.  Take w/ food. 01/14/24  Yes Tabori, Katherine E, MD  promethazine -dextromethorphan (PROMETHAZINE -DM) 6.25-15 MG/5ML syrup Take 5 mLs by mouth 4 (four) times daily as needed. 01/14/24  Yes Tabori, Katherine E, MD  UNABLE TO FIND AG-1 drink mix   Yes [provider]  vitamin C (ASCORBIC ACID) 500 MG tablet Take 500 mg by mouth 2 (two) times daily.   Yes [provider]  metoCLOPramide  (REGLAN ) 10 MG tablet Take 1 tablet (10 mg total) by mouth every 8 (eight) hours as needed for  up to 3 days for nausea. 01/26/24 01/29/24  Rolinda Climes, DO  promethazine  (PHENERGAN ) 25 MG suppository Place 1 suppository (25 mg total) rectally every 6 (six) hours as needed for up to 3 days for nausea or vomiting. 01/26/24 01/29/24  Rolinda Climes, DO   Social History   Socioeconomic History   Marital status: Married    Spouse name: Not on file   Number of children: 2   Years of education: Not on file   Highest education level: Not on file  Occupational History   Occupation:  Employer: [employer]   Occupation: Runner, broadcasting/film/video-- [employer]    Employer: [employer]  Tobacco Use   Smoking status: Never   Smokeless tobacco: Never  Vaping Use   Vaping status: Never Used  Substance and Sexual Activity   Alcohol use: Yes    Comment: special occasions   Drug use: No   Sexual activity: Yes    Comment: 1st intercourse 53 yo-Fewer than 5 partners-Vasectomy  Other Topics Concern   Not on file  Social History Narrative   Not on file   Social Drivers of Health   Financial Resource Strain: Not on file  Food Insecurity: Not on file  Transportation Needs: Not on file  Physical Activity: Not on file  Stress: Not on file  Social Connections: Not on file  Intimate Partner Violence: Not on file    Review of Systems   Objective:   Vitals:   03/17/24 1129  BP: 124/78  Pulse: 86  Temp: 98.1 F (36.7 C)  TempSrc: Temporal  SpO2: 98%  Weight: 193 lb 9.6 oz (87.8 kg)  Height: 5\' 4"  (1.626 m)     Physical Exam Vitals reviewed.  Constitutional:      General: She is not in acute distress.    Appearance: Normal appearance. She is well-developed.  HENT:     Head:     Comments: Soft tissue swelling on frontal scalp. No wounds.   Eyes:     Extraocular Movements: Extraocular movements intact.     Right eye: Normal extraocular motion and no nystagmus.     Left eye: Normal extraocular motion and no nystagmus.     Pupils: Pupils are equal,  round, and reactive to light. Pupils are equal.  Neck:     Comments: Tender along the mid to lower central C-spine, reports chronic discomfort in that area.  Decreased motion with right versus left rotation, but reports this is a chronic issue. Cardiovascular:     Rate and Rhythm: Normal rate.  Pulmonary:     Effort: Pulmonary effort is normal.  Neurological:     General: No focal deficit present.     Mental Status: She is alert and oriented to person, place, and time.     GCS: GCS eye subscore is 4. GCS verbal subscore is 5. GCS motor subscore is 6.     Cranial Nerves: No cranial nerve deficit, dysarthria or facial asymmetry.     Motor: No tremor or pronator drift.     Coordination: Romberg sign negative. Coordination normal. Finger-Nose-Finger Test and Heel to [employer] Test normal. Rapid alternating movements normal.     Gait: Gait is intact.  Psychiatric:        Mood and Affect: Mood normal.   61  minutes spent during visit, including chart review, counseling and assimilation of information, neurologic exam, discussion of plan, brief discussion with patient's primary care provider regarding plan, and chart completion.    Assessment & Plan:  Raelle Chambers is a 53 y.o. female . Acute post-traumatic headache, not intractable - Plan: CT HEAD WO CONTRAST ( )  Neck pain - Plan: DG Cervical Spine Complete, CANCELED: DG Cervical Spine Complete  History of fusion of cervical spine - Plan: DG Cervical Spine Complete, CANCELED: DG Cervical Spine Complete 4 days out from head injury as above with persistent headaches although slight improvement.  Nonfocal neuroexam, but some discomfort along mid C-spine with prior fusion.  Report of possible retinal hemorrhage as above from recent ophthalmology eval with  plan to follow-up to monitor for resolution.  Will order CT head to rule out subdural hematoma/bleed/fracture.  C-spine x-ray given prior hardware to rule out obvious fracture and alignment of  hardware.  Advised patient to contact her neurosurgeon regarding the neck pain and other recommendations for other testing or evaluation.  Suspect physical therapy may be helpful for neck soreness depending on imaging as above.  Further recommendations once imaging reviewed.  No orders of the defined types were placed in this encounter.  Patient Instructions  Thank you for coming in today.  I have ordered a CT scan of the head as well as an x-ray of your neck to be performed today and we will be contacting you once we have that appointment scheduled.  I do recommend contacting the neurosurgeon regarding the neck pain, but I am checking an x-ray today.  Suspect physical therapy may be helpful for your neck but again I would like to check the imaging as above first.  Hang in there.  Return to the clinic or go to the nearest emergency room if any of your symptoms worsen or new symptoms occur.   Head Injury, Adult There are many types of head injuries. Head injuries can be as minor as a small bump, or they can be a serious medical issue. More severe head injuries include: A jarring injury to the brain (concussion). A bruise (contusion) of the brain. This means there is bleeding in the brain that can cause swelling. A cracked skull (skull fracture). Bleeding in the brain that collects, clots, and forms a bump (hematoma). After a head injury, most problems occur within the first 24 hours, but side effects may occur up to 7-10 days after the injury. It is important to watch your condition for any changes. You may need to be observed in the emergency department or urgent care, or you may have to stay in the hospital. What are the causes? There are many causes of a head injury. Serious head injuries may be caused by car crashes, bicycle or motorcycle crashes, sports injuries, falls, or being struck by an object. What are the symptoms? Symptoms of a head injury include a contusion, bump, or bleeding at the  site of the injury. Other physical symptoms may include: Headache. Nausea or vomiting. Dizziness. Blurred or double vision. Sensitivity to bright lights or loud noises. Feeling tired. Trouble waking up. Severe symptoms such as: Weakness or numbness on one side of the body. Slurred speech or swallowing problems. Loss of consciousness. Seizures. Mental symptoms may include: Irritability. Confusion and memory problems. Poor attention and concentration. Changes in eating or sleeping habits. Anxiety or depression. How is this diagnosed? This condition is diagnosed based on your symptoms and a physical exam. You may also have imaging tests done, such as a CT scan or an MRI. How is this treated? Treatment for this condition depends on the severity and type of injury you have. The main goal of treatment is to prevent complications and allow the brain time to heal. Mild head injury If you have a mild head injury, you may be sent home, and treatment may include: Observation. A responsible adult should stay with you for 24 hours after your injury and check on you often. Physical rest. Brain rest. Pain medicines. Severe head injury If you have a severe head injury, treatment may include: Close observation. You may have to stay in the hospital and have: Frequent physical exams. Frequent checks of how your brain and nervous system  are working. Your blood pressure and oxygen levels checked. Medicines to relieve pain, prevent seizures, and decrease brain swelling. Airway protection and breathing support. This may include using a ventilator. Monitoring and managing swelling inside the brain. Brain surgery. Surgery may include: Removing a collection of blood or blood clots. Stopping the bleeding. Removing a part of the skull to make room for the brain to swell. Follow these instructions at home: Activity Rest. Avoid activities that are hard or tiring. Make sure you get enough sleep. Let  your brain rest by limiting activities that take a lot of thought or attention, such as: Watching TV. Playing memory games and doing puzzles. Job-related work or homework. Working on [employer], and texting. Avoid activities that could cause another head injury, such as playing sports, until your health care provider approves. Ask your provider when it is safe for you to return to your regular activities, such as work or school. Ask your provider when you can drive, ride a bicycle, or use machinery. Your ability to react may be slower after a brain injury. Do not do these activities if you are dizzy. Lifestyle  Do not drink alcohol until your provider approves. Do not use drugs. Alcohol and certain drugs may slow your recovery and can put you at risk of further injury. If it is hard to remember things, write them down. If you are easily distracted, try to do one thing at a time. Talk with family members or close friends when making important decisions. Tell your friends, family, a trusted colleague, and work Production designer, theatre/television/film about your injury, symptoms, and restrictions. Ask them to watch for any problems that are new or get worse. General instructions Take over-the-counter and prescription medicines only as told by your provider. Have a responsible adult stay with you for 24 hours after your head injury. They should watch you for any changes in your symptoms and be ready to get help right away. Keep all follow-up visits to make sure your needs are being met and catch any new problems early. How is this prevented? Avoiding another brain injury is very important. In rare cases, another injury can lead to permanent brain damage, brain swelling, or death. The risk of this is greatest during the first 7-10 days after a head injury. To avoid injuries: Improve your balance and strength to avoid falls. Wear a seat belt when you are in a moving vehicle. Wear a helmet when riding a  bicycle, skiing, or doing any other sport that has a risk of injury. Take safety measures in your home to prevent falls, such as: Removing clutter and tripping hazards. Using grab bars in bathrooms and handrails by stairs. Placing non-slip mats on floors and in bathtubs. Improving lighting in dim areas. Where to find more information Brain Injury Association: biausa.org Contact a health care provider if: You have headaches that do not go away. You have dizziness that does not go away. You have double vision or vision changes that do not go away. You have difficulty sleeping. You have changes in your mood. You have new symptoms. Get help right away if: You have sudden: Severe headache. Severe vomiting. Unequal pupil size. One is bigger than the other. Vision problems. Confusion or irritability. You have a seizure. Your symptoms get worse. You have clear or bloody fluid coming from your nose or ears. These symptoms may be an emergency. Get help right away. Call 911. Do not wait to see if the symptoms will go  away. Do not drive yourself to the hospital. This information is not intended to replace advice given to you by your health care provider. Make sure you discuss any questions you have with your health care provider. Document Revised: 08/29/2022 Document Reviewed: 08/29/2022 Elsevier Patient Education  2024 [employer]    Signed,   Caro Christmas, MD [facility], [facility] 03/17/24 1:42 PM

## 2024-03-26 ENCOUNTER — Encounter: Payer: Self-pay | Admitting: Family

## 2024-03-26 ENCOUNTER — Ambulatory Visit: Admitting: Family

## 2024-03-26 ENCOUNTER — Encounter: Payer: Self-pay | Admitting: Family Medicine

## 2024-03-26 ENCOUNTER — Ambulatory Visit (INDEPENDENT_AMBULATORY_CARE_PROVIDER_SITE_OTHER): Admitting: Family

## 2024-03-26 VITALS — BP 139/83 | HR 77 | Temp 97.7°F | Ht 64.0 in | Wt 191.0 lb

## 2024-03-26 DIAGNOSIS — S060X0A Concussion without loss of consciousness, initial encounter: Secondary | ICD-10-CM

## 2024-03-26 NOTE — Progress Notes (Signed)
 Patient ID: Lynn Campos, female    DOB: December 09, 1970, 53 y.o.   MRN: [id number]  Chief Complaint  Patient presents with   Concussion    Ladder fell on pt head 2 weeks ago. Bleed on optic nerve. Pt c/o cognitive issues since then.   Discussed the use of AI scribe software for clinical note transcription with the patient, who gave verbal consent to proceed.  History of Present Illness Lynn Campos is a 53 year old female who presents with cognitive symptoms following a head injury.  Two weeks ago, Lynn Campos sustained a head injury with a scrape and hematoma on the top of her head. She experienced vision changes immediately after, described as 'everything kind of went black', but did not pass out. She did not seek ED care at the time. She did see an ophthalmologist who noted retinal bleeding that was improving.  A CT scan confirmed a concussion, and ruled out intracranial bleeding or other anomalies.  Initially, she had dizziness and nausea, especially with quick movements, but these have resolved, and her headaches are slowly improving. She now experiences cognitive symptoms, including difficulty remembering names of close acquaintances and 'brain fog'. 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 She states she went back to work fairly soon after the injury. Reading and screen time initially caused headaches, which have improved. She manages her environment by reducing light exposure to alleviate discomfort. Her past medical history includes migraines with aura, but she has not had a migraine since the injury. She denies speech problems, numbness, tingling, ear pain, or new vision changes since the incident. No dizziness occurs when turning her head while lying down.   Assessment & Plan Concussion Cognitive symptoms persist post-concussion, but dizziness, nausea, headaches have improved. Resting the brain is crucial for recovery which can  take a full 2-4 weeks for complete recovery. Hx of migraines can prolong recovery. Neuro exam negative. - Rest brain and avoid activities requiring concentration/focus. - Reduce screen time (TV, phone, laptop), reading, and exposure to bright lights. - Refer to concussion clinic and ok to cancel appt if no worsening sx in another week. - Consider staying out of work if symptoms do not improve after rest.  Migraine with aura Infrequent migraines with aura, no recent episodes. Imitrex prescribed but rarely used, states she has had one migraine in the last year. - Continue current management for migraines with aura. - Use Imitrex as needed for migraine episodes.   Subjective:    Outpatient Medications Prior to Visit  Medication Sig Dispense Refill   acetaminophen  (TYLENOL  8 HOUR) 650 MG CR tablet Take 1 tablet (650 mg total) by mouth every 8 (eight) hours as needed for pain.     ALPRAZolam  (XANAX ) 0.25 MG tablet TAKE 1 TABLET(0.25 MG) BY MOUTH TWICE DAILY AS NEEDED FOR ANXIETY 30 tablet 3   calcium carbonate (OSCAL) 1500 (600 Ca) MG TABS tablet Take by mouth 2 (two) times daily with a meal.     cyclobenzaprine  (FLEXERIL ) 10 MG tablet TAKE 1 TABLET(10 MG) BY MOUTH THREE TIMES DAILY AS NEEDED FOR MUSCLE SPASMS 30 tablet 3   EPINEPHrine  0.3 mg/0.3 mL IJ SOAJ injection Inject 0.3 mLs (0.3 mg total) into the muscle once. 1 Device 2   gabapentin  (NEURONTIN ) 300 MG capsule TAKE 1 CAPSULE(300 MG) BY MOUTH TWICE DAILY 60 capsule 3   hydrochlorothiazide  (HYDRODIURIL ) 12.5 MG tablet TAKE 1 TABLET(12.5 MG) BY MOUTH DAILY 30  tablet 3   hyoscyamine  (LEVSIN) 0.125 MG tablet Take 1 tablet (0.125 mg total) by mouth every 4 (four) hours as needed. 45 tablet 0   LO LOESTRIN FE 1 MG-10 MCG / 10 MCG tablet Take 1 tablet by mouth daily.     loperamide  (IMODIUM  A-D) 2 MG tablet Take 1 tablet (2 mg total) by mouth 4 (four) times daily as needed for diarrhea or loose stools (Can use up to 16 mg per day for diarrhea).  30 tablet 2   ondansetron  (ZOFRAN -ODT) 8 MG disintegrating tablet Take 1 tablet (8 mg total) by mouth every 8 (eight) hours as needed for nausea or vomiting. 30 tablet 1   oxyCODONE -acetaminophen  (PERCOCET/ROXICET) 5-325 MG tablet Take 1 tablet by mouth every 4 (four) hours as needed. Dx M54.2 30 tablet 0   UNABLE TO FIND AG-1 drink mix     vitamin C (ASCORBIC ACID) 500 MG tablet Take 500 mg by mouth 2 (two) times daily.     metoCLOPramide  (REGLAN ) 10 MG tablet Take 1 tablet (10 mg total) by mouth every 8 (eight) hours as needed for up to 3 days for nausea. 9 tablet 0   predniSONE  (DELTASONE ) 10 MG tablet 3 tabs x3 days and then 2 tabs x3 days and then 1 tab x3 days.  Take w/ food. (Patient not taking: Reported on 03/26/2024) 18 tablet 0   promethazine  (PHENERGAN ) 25 MG suppository Place 1 suppository (25 mg total) rectally every 6 (six) hours as needed for up to 3 days for nausea or vomiting. 9 each 0   promethazine -dextromethorphan (PROMETHAZINE -DM) 6.25-15 MG/5ML syrup Take 5 mLs by mouth 4 (four) times daily as needed. (Patient not taking: Reported on 03/26/2024) 180 mL 0   No facility-administered medications prior to visit.   Past Medical History:  Diagnosis Date   Anemia    in the past   Anxiety    Arthritis    neck   Asthma    uses inhaler PRN   Dyspnea    GERD (gastroesophageal reflux disease)    Heart murmur    Hyperlipidemia    diet controlled   Hypertension    Lupus    Mitral regurgitation    Palpitations    Rosacea    Seasonal allergies    Shingles    Tuberculosis 2012   medication x 1 month for treatment   Past Surgical History:  Procedure Laterality Date   APPENDECTOMY     CERVICAL ABLATION  2022   with cerivcal cyst removed   CERVICAL SPINE SURGERY  01/2010   with plate placed   CHOLECYSTECTOMY  05/2010   ROTATOR CUFF REPAIR Left    SIGMOIDOSCOPY  2016   Dr. Grandville Lax   Allergies  Allergen Reactions   Flovent  [Fluticasone  Propionate]     Difficulty  breathing. No issues with Qvar  Rxed by Dr Bennetta Braun   Fluticasone     Fluticasone  Furoate    Mushroom Extract Complex (Obsolete) Swelling    Pt reports hand swelling when consuming mushrooms.    Other     Bee stings and pecan nuts       Objective:    Physical Exam Vitals and nursing note reviewed.  Constitutional:      Appearance: Normal appearance.  Eyes:     Extraocular Movements: Extraocular movements intact.     Left eye: No nystagmus.     Conjunctiva/sclera: Conjunctivae normal.  Cardiovascular:     Rate and Rhythm: Normal rate and regular rhythm.  Pulmonary:  Effort: Pulmonary effort is normal.     Breath sounds: Normal breath sounds.  Musculoskeletal:        General: Normal range of motion.  Skin:    General: Skin is warm and dry.  Neurological:     Mental Status: She is alert.     Cranial Nerves: Cranial nerves 2-12 are intact.     Sensory: Sensation is intact.     Motor: Motor function is intact.     Coordination: Coordination is intact.     Gait: Gait is intact.  Psychiatric:        Mood and Affect: Mood normal.        Behavior: Behavior normal.    BP 139/83 (BP Location: Left Arm, Patient Position: Sitting, Cuff Size: Large)   Pulse 77   Temp 97.7 F (36.5 C) (Temporal)   Ht 5\' 4"  (1.626 m)   Wt 191 lb (86.6 kg)   SpO2 99%   BMI 32.79 kg/m  Wt Readings from Last 3 Encounters:  03/26/24 191 lb (86.6 kg)  03/17/24 193 lb 9.6 oz (87.8 kg)  01/07/24 194 lb (88 kg)      Versa Gore, NP

## 2024-03-26 NOTE — Telephone Encounter (Signed)
 Discussed with Dr. Paulla Bossier, patient needs to be evaluated. I called patient and was able to get her in with Pad[facility] at [facility] today.

## 2024-04-06 ENCOUNTER — Other Ambulatory Visit: Payer: Self-pay | Admitting: Family Medicine

## 2024-04-07 MED ORDER — OXYCODONE-ACETAMINOPHEN 5-325 MG PO TABS: 1.0000 | ORAL_TABLET | ORAL | 0 refills | Status: DC | PRN

## 2024-04-08 ENCOUNTER — Ambulatory Visit

## 2024-04-08 VITALS — Temp 97.1°F

## 2024-04-08 DIAGNOSIS — E538 Deficiency of other specified B group vitamins: Secondary | ICD-10-CM

## 2024-04-08 MED ORDER — CYANOCOBALAMIN 1000 MCG/ML IJ SOLN
1000.0000 ug | Freq: Once | INTRAMUSCULAR | Status: AC
  Administered 2024-04-08: 1000 ug via INTRAMUSCULAR

## 2024-04-08 NOTE — Progress Notes (Signed)
 Per orders of Laymon Priest, MD, injection of B-12 given by Stefani Edin in left deltoid. Patient tolerated injection well. Patient will make appointment for 1 month for her 3rd does of B12.

## 2024-04-21 ENCOUNTER — Other Ambulatory Visit: Payer: Self-pay | Admitting: Family Medicine

## 2024-04-21 DIAGNOSIS — Z1231 Encounter for screening mammogram for malignant neoplasm of breast: Secondary | ICD-10-CM

## 2024-04-23 ENCOUNTER — Encounter: Payer: Self-pay | Admitting: Family Medicine

## 2024-04-23 MED ORDER — OXYCODONE-ACETAMINOPHEN 5-325 MG PO TABS: 1.0000 | ORAL_TABLET | ORAL | 0 refills | Status: DC | PRN

## 2024-05-10 ENCOUNTER — Ambulatory Visit
Admission: RE | Admit: 2024-05-10 | Discharge: 2024-05-10 | Disposition: A | Discharge status: Home / Self Care | Source: Ambulatory Visit | Attending: Family Medicine | Admitting: Family Medicine

## 2024-05-10 DIAGNOSIS — Z1231 Encounter for screening mammogram for malignant neoplasm of breast: Secondary | ICD-10-CM

## 2024-05-11 ENCOUNTER — Ambulatory Visit

## 2024-05-11 ENCOUNTER — Other Ambulatory Visit: Payer: Self-pay

## 2024-05-11 MED ORDER — HYDROCHLOROTHIAZIDE 12.5 MG PO TABS: 12.5000 mg | ORAL_TABLET | Freq: Every day | ORAL | 3 refills | Status: DC

## 2024-05-12 ENCOUNTER — Other Ambulatory Visit: Payer: Self-pay | Admitting: Family Medicine

## 2024-05-15 ENCOUNTER — Other Ambulatory Visit: Payer: Self-pay | Admitting: Family Medicine

## 2024-05-17 ENCOUNTER — Encounter: Payer: Self-pay | Admitting: Family Medicine

## 2024-05-18 MED ORDER — OXYCODONE-ACETAMINOPHEN 5-325 MG PO TABS: 1.0000 | ORAL_TABLET | ORAL | 0 refills | Status: DC | PRN

## 2024-05-19 ENCOUNTER — Ambulatory Visit

## 2024-05-31 ENCOUNTER — Encounter: Payer: Self-pay | Admitting: Family Medicine

## 2024-05-31 MED ORDER — OXYCODONE-ACETAMINOPHEN 5-325 MG PO TABS: 1.0000 | ORAL_TABLET | ORAL | 0 refills | Status: DC | PRN

## 2024-05-31 NOTE — Addendum Note (Signed)
 Addended by: Darchelle Nunes E on: 05/31/2024 10:38 AM   Modules accepted: Orders

## 2024-06-28 ENCOUNTER — Other Ambulatory Visit: Payer: Self-pay | Admitting: Family Medicine

## 2024-06-28 DIAGNOSIS — F411 Generalized anxiety disorder: Secondary | ICD-10-CM

## 2024-06-28 LAB — HM PAP SMEAR

## 2024-06-28 NOTE — Telephone Encounter (Signed)
 Requested Prescriptions   Pending Prescriptions Disp Refills   ALPRAZolam  (XANAX ) 0.25 MG tablet [Pharmacy Med Name: ALPRAZOLAM  0.25MG  TABLETS] 30 tablet     Sig: TAKE 1 TABLET(0.25 MG) BY MOUTH TWICE DAILY AS NEEDED FOR ANXIETY     Date of patient request: 06/28/2024 Last office visit: 09/29/2023 Upcoming visit: Visit date not found Date of last refill: 03/15/24 Last refill amount: 30 tablets with 3 refills

## 2024-06-30 ENCOUNTER — Encounter: Payer: Self-pay | Admitting: Family Medicine

## 2024-06-30 MED ORDER — OXYCODONE-ACETAMINOPHEN 5-325 MG PO TABS: 1.0000 | ORAL_TABLET | ORAL | 0 refills | Status: DC | PRN

## 2024-06-30 NOTE — Telephone Encounter (Signed)
 Requested Prescriptions   Pending Prescriptions Disp Refills   oxyCODONE -acetaminophen  (PERCOCET/ROXICET) 5-325 MG tablet 30 tablet 0    Sig: Take 1 tablet by mouth every 4 (four) hours as needed. Dx M54.2     Date of patient request: 06/30/24 Last office visit: 09/29/2023 Upcoming visit: Visit date not found Date of last refill: 05/31/24 Last refill amount: 5 days

## 2024-07-01 ENCOUNTER — Other Ambulatory Visit: Payer: Self-pay | Admitting: Family Medicine

## 2024-07-01 DIAGNOSIS — M62838 Other muscle spasm: Secondary | ICD-10-CM

## 2024-07-22 ENCOUNTER — Encounter: Payer: Self-pay | Admitting: Family Medicine

## 2024-07-23 MED ORDER — OXYCODONE-ACETAMINOPHEN 5-325 MG PO TABS: 1.0000 | ORAL_TABLET | ORAL | 0 refills | Status: DC | PRN

## 2024-07-23 NOTE — Telephone Encounter (Signed)
 Requested Prescriptions   Pending Prescriptions Disp Refills   oxyCODONE -acetaminophen  (PERCOCET/ROXICET) 5-325 MG tablet 30 tablet 0    Sig: Take 1 tablet by mouth every 4 (four) hours as needed. Dx M54.2     Date of patient request: 07/23/2024 Last office visit: 09/29/2023 Upcoming visit: Visit date not found Date of last refill: 06/30/2024 Last refill amount: 30 tab 0 refills

## 2024-08-01 ENCOUNTER — Encounter: Payer: Self-pay | Admitting: Family Medicine

## 2024-08-13 ENCOUNTER — Encounter: Payer: Self-pay | Admitting: Family Medicine

## 2024-08-13 MED ORDER — OXYCODONE-ACETAMINOPHEN 5-325 MG PO TABS: 1.0000 | ORAL_TABLET | ORAL | 0 refills | Status: DC | PRN

## 2024-08-13 NOTE — Telephone Encounter (Signed)
 Requested Prescriptions   Pending Prescriptions Disp Refills   oxyCODONE -acetaminophen  (PERCOCET/ROXICET) 5-325 MG tablet 30 tablet 0    Sig: Take 1 tablet by mouth every 4 (four) hours as needed. Dx M54.2     Date of patient request: 08/13/24 Last office visit: 09/29/2023 Upcoming visit: sent message to have patient schedule Date of last refill: 07/23/24 Last refill amount: 5 day supply

## 2024-08-23 ENCOUNTER — Other Ambulatory Visit: Payer: Self-pay | Admitting: Family Medicine

## 2024-08-23 ENCOUNTER — Encounter: Payer: Self-pay | Admitting: Family Medicine

## 2024-08-25 ENCOUNTER — Encounter: Payer: Self-pay | Admitting: Family Medicine

## 2024-08-25 ENCOUNTER — Ambulatory Visit: Admitting: Family Medicine

## 2024-08-25 VITALS — BP 124/78 | HR 83 | Temp 98.3°F | Wt 201.6 lb

## 2024-08-25 DIAGNOSIS — I10 Essential (primary) hypertension: Secondary | ICD-10-CM

## 2024-08-25 DIAGNOSIS — R519 Headache, unspecified: Secondary | ICD-10-CM | POA: Diagnosis not present

## 2024-08-25 DIAGNOSIS — G8929 Other chronic pain: Secondary | ICD-10-CM

## 2024-08-25 DIAGNOSIS — E669 Obesity, unspecified: Secondary | ICD-10-CM | POA: Diagnosis not present

## 2024-08-25 NOTE — Assessment & Plan Note (Signed)
 Ongoing issue.  Pt has known neck issues and is trying to manage w/o additional surgery.  Continue to fill Percocet prn

## 2024-08-25 NOTE — Assessment & Plan Note (Signed)
 Chronic problem.  Well controlled on hydrochlorothiazide  12.5mg  daily.  Asymptomatic.  Check labs due to diuretic use but no anticipated med changes.

## 2024-08-25 NOTE — Patient Instructions (Signed)
 Schedule your complete physical in 6 months We'll notify you of your lab results and make any changes if needed Continue to work on healthy diet and regular exercise- you can do it! I think you headache is nerve related- call Dr Bonner as needed Call with any questions or concerns Stay Safe!  Stay Healthy! Hang in there!!!

## 2024-08-25 NOTE — Assessment & Plan Note (Signed)
 Deteriorated.  She has gained 14 lbs since last year.  Encouraged low carb diet and regular exercise.  Will check labs to risk stratify.  Will follow.

## 2024-08-25 NOTE — Progress Notes (Signed)
   Subjective:    Patient ID: Lynn Campos, female    DOB: Mar 16, 1971, 53 y.o.   MRN: [id number]  HPI HTN- chronic problem.  On hydrochlorothiazide  12.5mg  daily w/ good control.  Pt denies CP, SOB, visual changes, edema.  HA- R coronal HA, sxs started ~1 week ago.  Woke her from sleep.  Some relief w/ Flexeril .  Is getting better w/ time.  Has some relief w/ bending forward.  Pain described as nerve type pain.  Obesity- BMI now 34.6  She is up 14 lbs since last year.  Chronic pain- ongoing issue.  On Percocet 5/325mg  prn.  Also on Gabapentin  300mg  BID and Flexeril  10mg  TID prn.   Review of Systems For ROS see HPI     Objective:   Physical Exam Vitals reviewed.  Constitutional:      General: She is not in acute distress.    Appearance: Normal appearance. She is well-developed. She is obese. She is not ill-appearing.  HENT:     Head: Normocephalic and atraumatic.  Eyes:     Conjunctiva/sclera: Conjunctivae normal.     Pupils: Pupils are equal, round, and reactive to light.  Neck:     Thyroid : No thyromegaly.  Cardiovascular:     Rate and Rhythm: Normal rate and regular rhythm.     Pulses: Normal pulses.     Heart sounds: Normal heart sounds. No murmur heard. Pulmonary:     Effort: Pulmonary effort is normal. No respiratory distress.     Breath sounds: Normal breath sounds.  Abdominal:     General: There is no distension.     Palpations: Abdomen is soft.     Tenderness: There is no abdominal tenderness.  Musculoskeletal:     Cervical back: Normal range of motion and neck supple.     Right lower leg: No edema.     Left lower leg: No edema.  Lymphadenopathy:     Cervical: No cervical adenopathy.  Skin:    General: Skin is warm and dry.  Neurological:     General: No focal deficit present.     Mental Status: She is alert and oriented to person, place, and time. Mental status is at baseline.     Cranial Nerves: No cranial nerve deficit.  Psychiatric:        Mood and  Affect: Mood normal.        Behavior: Behavior normal.        Thought Content: Thought content normal.           Assessment & Plan:  Sudden onset severe HA- new.  Pt reports pain is better than when it started a week ago.  Seems to be positional.  Given known neck issues and nerve like description of pain, suspect this is radicular due to her neck.  Encouraged her to reach out to Dr Bonner for possible repeat injxn.  Pt expressed understanding and is in agreement w/ plan.

## 2024-08-26 LAB — BASIC METABOLIC PANEL WITH GFR
BUN: 14 mg/dL (ref 6–23)
CO2: 31 meq/L (ref 19–32)
Calcium: 9.2 mg/dL (ref 8.4–10.5)
Chloride: 99 meq/L (ref 96–112)
Creatinine, Ser: 0.72 mg/dL (ref 0.40–1.20)
GFR: 94.06 mL/min (ref >60.00)
Glucose, Bld: 96 mg/dL (ref 70–99)
Potassium: 3.5 meq/L (ref 3.5–5.1)
Sodium: 139 meq/L (ref 135–145)

## 2024-08-26 LAB — HEPATIC FUNCTION PANEL
ALT: 25 U/L (ref 0–35)
AST: 21 U/L (ref 0–37)
Albumin: 4.3 g/dL (ref 3.5–5.2)
Alkaline Phosphatase: 48 U/L (ref 39–117)
Bilirubin, Direct: 0 mg/dL (ref 0.0–0.3)
Total Bilirubin: 0.3 mg/dL (ref 0.2–1.2)
Total Protein: 7.2 g/dL (ref 6.0–8.3)

## 2024-08-26 LAB — CBC WITH DIFFERENTIAL/PLATELET
Basophils Absolute: 0 K/uL (ref 0.0–0.1)
Basophils Relative: 0.8 % (ref 0.0–3.0)
Eosinophils Absolute: 0.2 K/uL (ref 0.0–0.7)
Eosinophils Relative: 7.8 % — ABNORMAL HIGH (ref 0.0–5.0)
HCT: 34.8 % — ABNORMAL LOW (ref 36.0–46.0)
Hemoglobin: 11.9 g/dL — ABNORMAL LOW (ref 12.0–15.0)
Lymphocytes Relative: 48.4 % — ABNORMAL HIGH (ref 12.0–46.0)
Lymphs Abs: 1.5 K/uL (ref 0.7–4.0)
MCHC: 34.2 g/dL (ref 30.0–36.0)
MCV: 88.1 fl (ref 78.0–100.0)
Monocytes Absolute: 0.5 K/uL (ref 0.1–1.0)
Monocytes Relative: 16.9 % — ABNORMAL HIGH (ref 3.0–12.0)
Neutro Abs: 0.8 K/uL — ABNORMAL LOW (ref 1.4–7.7)
Neutrophils Relative %: 26.1 % — ABNORMAL LOW (ref 43.0–77.0)
Platelets: 231 K/uL (ref 150.0–400.0)
RBC: 3.95 Mil/uL (ref 3.87–5.11)
RDW: 13.7 % (ref 11.5–15.5)
WBC: 3 K/uL — ABNORMAL LOW (ref 4.0–10.5)

## 2024-08-26 LAB — LIPID PANEL
Cholesterol: 195 mg/dL (ref 0–200)
HDL: 41.1 mg/dL (ref >39.00)
LDL Cholesterol: 115 mg/dL — ABNORMAL HIGH (ref 0–99)
NonHDL: 153.44
Total CHOL/HDL Ratio: 5
Triglycerides: 190 mg/dL — ABNORMAL HIGH (ref 0.0–149.0)
VLDL: 38 mg/dL (ref 0.0–40.0)

## 2024-08-26 LAB — TSH: TSH: 1.38 u[IU]/mL (ref 0.35–5.50)

## 2024-08-27 ENCOUNTER — Ambulatory Visit: Payer: Self-pay | Admitting: Family Medicine

## 2024-08-27 DIAGNOSIS — D709 Neutropenia, unspecified: Secondary | ICD-10-CM

## 2024-08-27 NOTE — Telephone Encounter (Signed)
 Spoke to patient, she had no questions about her lab results she did view them on my chart. I scheduled her a lab visit for two weeks at [facility] per her request because it's closer to work. Future ordered lab.

## 2024-08-27 NOTE — Telephone Encounter (Signed)
-----   Message from Comer Greet sent at 08/27/2024  7:38 AM EDT ----- LDL (bad cholesterol) is up slightly.  This will improve w/ healthy diet and regular exercise.  No medication is needed at this time but we will follow.  Your white blood cell count distribution is a little wonky, but this could be from your recent vaccine reaction/illness.  I want to repeat your CBC at a lab only visit in 2 weeks (CBC w/ diff- dx  neutropenia) to ensure things are back in range.  Remainder of labs look great! ----- Message ----- From: Interface, Lab [facility] Sent: 08/26/2024   2:08 PM EDT To: Comer FORBES Greet, MD

## 2024-08-31 ENCOUNTER — Other Ambulatory Visit: Payer: Self-pay | Admitting: Family Medicine

## 2024-09-01 ENCOUNTER — Encounter: Payer: Self-pay | Admitting: Family Medicine

## 2024-09-01 NOTE — Telephone Encounter (Signed)
 Reviewed AVS from 08/25/2024 no note about fatigue directions, patient does have lab recheck scheduled  Should patient make another visit?

## 2024-09-01 NOTE — Telephone Encounter (Signed)
 Requested Prescriptions   Pending Prescriptions Disp Refills   oxyCODONE -acetaminophen  (PERCOCET/ROXICET) 5-325 MG tablet 30 tablet 0    Sig: Take 1 tablet by mouth every 4 (four) hours as needed. Dx M54.2     Date of patient request: 09/01/2024 Last office visit: 08/25/2024 Upcoming visit: Visit date not found Date of last refill: 08/13/2024 Last refill amount: 30   Patient has appt with Ortho Friday but asking for a refill on this till then

## 2024-09-03 MED ORDER — OXYCODONE-ACETAMINOPHEN 5-325 MG PO TABS: 1.0000 | ORAL_TABLET | ORAL | 0 refills | Status: DC | PRN

## 2024-09-08 ENCOUNTER — Encounter: Payer: Self-pay | Admitting: Family Medicine

## 2024-09-08 DIAGNOSIS — E538 Deficiency of other specified B group vitamins: Secondary | ICD-10-CM

## 2024-09-09 NOTE — Addendum Note (Signed)
 Addended by: Esteven Overfelt E on: 09/09/2024 04:27 PM   Modules accepted: Orders

## 2024-09-10 ENCOUNTER — Encounter: Payer: Self-pay | Admitting: Family Medicine

## 2024-09-10 ENCOUNTER — Other Ambulatory Visit

## 2024-09-10 DIAGNOSIS — D709 Neutropenia, unspecified: Secondary | ICD-10-CM | POA: Diagnosis not present

## 2024-09-10 DIAGNOSIS — E538 Deficiency of other specified B group vitamins: Secondary | ICD-10-CM

## 2024-09-10 LAB — CBC WITH DIFFERENTIAL/PLATELET
Basophils Absolute: 0 K/uL (ref 0.0–0.1)
Basophils Relative: 0.6 % (ref 0.0–3.0)
Eosinophils Absolute: 0.3 K/uL (ref 0.0–0.7)
Eosinophils Relative: 3.9 % (ref 0.0–5.0)
HCT: 38.7 % (ref 36.0–46.0)
Hemoglobin: 12.6 g/dL (ref 12.0–15.0)
Lymphocytes Relative: 31.3 % (ref 12.0–46.0)
Lymphs Abs: 2 K/uL (ref 0.7–4.0)
MCHC: 32.6 g/dL (ref 30.0–36.0)
MCV: 89.3 fl (ref 78.0–100.0)
Monocytes Absolute: 0.5 K/uL (ref 0.1–1.0)
Monocytes Relative: 7.7 % (ref 3.0–12.0)
Neutro Abs: 3.6 K/uL (ref 1.4–7.7)
Neutrophils Relative %: 56.5 % (ref 43.0–77.0)
Platelets: 286 K/uL (ref 150.0–400.0)
RBC: 4.33 Mil/uL (ref 3.87–5.11)
RDW: 14.1 % (ref 11.5–15.5)
WBC: 6.4 K/uL (ref 4.0–10.5)

## 2024-09-10 LAB — B12 AND FOLATE PANEL
Folate: 19.5 ng/mL (ref >5.9)
Vitamin B-12: 419 pg/mL (ref 211–911)

## 2024-09-10 NOTE — Telephone Encounter (Signed)
 FYI   Patient stats she forgot to mention to you Ortho prescribed medral pack for her neck and is not sure if this will mess up her labs?

## 2024-09-13 ENCOUNTER — Ambulatory Visit: Payer: Self-pay | Admitting: Family Medicine

## 2024-09-13 NOTE — Progress Notes (Signed)
 Pt has reviewed labs via MyChart

## 2024-09-14 ENCOUNTER — Other Ambulatory Visit: Payer: Self-pay

## 2024-09-14 MED ORDER — GABAPENTIN 300 MG PO CAPS: ORAL_CAPSULE | ORAL | 3 refills | Status: DC

## 2024-09-23 ENCOUNTER — Encounter: Payer: Self-pay | Admitting: Family Medicine

## 2024-09-23 MED ORDER — OXYCODONE-ACETAMINOPHEN 5-325 MG PO TABS: 1.0000 | ORAL_TABLET | ORAL | 0 refills | Status: DC | PRN

## 2024-09-23 NOTE — Telephone Encounter (Signed)
 Requested Prescriptions   Pending Prescriptions Disp Refills   oxyCODONE -acetaminophen  (PERCOCET/ROXICET) 5-325 MG tablet 30 tablet 0    Sig: Take 1 tablet by mouth every 4 (four) hours as needed. Dx M54.2     Date of patient request: 09/23/2024 Last office visit: 08/25/2024 Upcoming visit: Visit date not found Date of last refill: 09/03/2024 Last refill amount: 30

## 2024-10-06 ENCOUNTER — Encounter: Payer: Self-pay | Admitting: Family Medicine

## 2024-10-06 ENCOUNTER — Other Ambulatory Visit: Payer: Self-pay | Admitting: Family Medicine

## 2024-10-06 DIAGNOSIS — M62838 Other muscle spasm: Secondary | ICD-10-CM

## 2024-10-06 DIAGNOSIS — F411 Generalized anxiety disorder: Secondary | ICD-10-CM

## 2024-10-07 MED ORDER — ALPRAZOLAM 0.25 MG PO TABS: ORAL_TABLET | ORAL | 3 refills | Status: AC

## 2024-10-07 MED ORDER — CYCLOBENZAPRINE HCL 10 MG PO TABS: ORAL_TABLET | ORAL | 3 refills | Status: AC

## 2024-10-07 NOTE — Telephone Encounter (Signed)
 Requesting: xanax  Contract:n/a UDS:n/a Last Visit:08/25/2024 Next Visit:N/A Last Refill:06/28/2024  Please Advise

## 2024-10-13 ENCOUNTER — Encounter: Payer: Self-pay | Admitting: Family Medicine

## 2024-10-14 MED ORDER — OXYCODONE-ACETAMINOPHEN 5-325 MG PO TABS: 1.0000 | ORAL_TABLET | ORAL | 0 refills | Status: DC | PRN

## 2024-10-14 NOTE — Addendum Note (Signed)
 Addended by: Emonte Dieujuste E on: 10/14/2024 12:58 PM   Modules accepted: Orders

## 2024-11-02 ENCOUNTER — Encounter: Payer: Self-pay | Admitting: Family Medicine

## 2024-11-03 MED ORDER — OXYCODONE-ACETAMINOPHEN 5-325 MG PO TABS: 1.0000 | ORAL_TABLET | ORAL | 0 refills | Status: DC | PRN

## 2024-11-03 NOTE — Addendum Note (Signed)
 Addended by: Jack Bolio E on: 11/03/2024 07:36 AM   Modules accepted: Orders

## 2024-11-05 ENCOUNTER — Encounter: Payer: Self-pay | Admitting: Family Medicine

## 2024-11-05 ENCOUNTER — Telehealth: Admitting: Family Medicine

## 2024-11-05 VITALS — Temp 99.8°F | Ht 64.0 in | Wt 192.0 lb

## 2024-11-05 DIAGNOSIS — M542 Cervicalgia: Secondary | ICD-10-CM | POA: Diagnosis not present

## 2024-11-05 NOTE — Progress Notes (Unsigned)
 Virtual Visit via Video   I connected with Lynn Campos on 11/05/2024 at 11:00 AM EST by a video enabled telemedicine application and verified that I am speaking with the correct person using two identifiers.  Location Lynn Campos: Home Location provider: Cloretta Dadds, Office Persons participating in the virtual visit: Lynn Campos, Provider, CMA Rheba RAMAN)  I discussed the limitations of evaluation and management by telemedicine and the availability of in person appointments. The Lynn Campos expressed understanding and agreed to proceed.  Subjective:   HPI:   Neck pain- ongoing issue for pt.  Saw Dr Burnetta at [facility] after getting injections from Dr Bonner.  Has new issue from C7-T1.  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  Dr Burnetta recommended surgery.  She wants to hold on surgery if possible so went back to Dr Bonner for injection.  This helped pain going down her arm but did not help her neck pain.  Has gone back to wearing a neck brace which is helping pain.  Has bought new pillows.  ROS:   See pertinent positives and negatives per HPI.  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  Resting comfortably *** at home.  Head is normocephalic, atraumatic.  No labored breathing.  Speech is clear and coherent with logical content.  Lynn Campos is alert and oriented at baseline.  ***  Assessment and Plan:        ***.   Comer Greet, MD 11/05/2024  Time spent with the Lynn Campos: *** minutes, of which >50% was spent in obtaining information about symptoms, reviewing previous labs, evaluations, and treatments, counseling about condition (please see the discussed topics above), and developing a plan to further investigate it; had a number of questions which I addressed.      [1]  Current Outpatient Medications:    acetaminophen  (TYLENOL  8 HOUR) 650 MG CR tablet, Take 1 tablet (650 mg total) by mouth every 8 (eight) hours as needed for pain., Disp: , Rfl:    ALPRAZolam  (XANAX ) 0.25 MG tablet, TAKE 1 TABLET(0.25 MG) BY MOUTH TWICE DAILY AS NEEDED FOR ANXIETY, Disp: 30 tablet, Rfl: 3   calcium carbonate (OSCAL) 1500 (600 Ca) MG TABS tablet, Take by mouth 2 (two) times daily with a meal., Disp: , Rfl:    cyclobenzaprine  (FLEXERIL ) 10 MG tablet, TAKE 1 TABLET(10 MG) BY MOUTH THREE TIMES DAILY AS NEEDED FOR MUSCLE SPASMS, Disp: 30 tablet,  Rfl: 3   EPINEPHrine  0.3 mg/0.3 mL IJ SOAJ injection, Inject 0.3 mLs (0.3 mg total) into the muscle once., Disp: 1 Device, Rfl: 2   gabapentin  (NEURONTIN ) 300 MG capsule, TAKE 1 CAPSULE(300 MG) BY MOUTH TWICE DAILY, Disp: 60 capsule, Rfl: 3   hydrochlorothiazide  (HYDRODIURIL ) 12.5 MG tablet, TAKE 1 TABLET(12.5 MG) BY MOUTH DAILY, Disp: 90 tablet, Rfl: 1   hyoscyamine  (LEVSIN ) 0.125 MG tablet, TAKE 1 TABLET(0.125 MG) BY MOUTH EVERY 4 HOURS AS NEEDED, Disp: 45 tablet, Rfl: 0   LO LOESTRIN FE 1 MG-10 MCG / 10 MCG tablet, Take 1  tablet by mouth daily., Disp: , Rfl:    loperamide  (IMODIUM  A-D) 2 MG tablet, Take 1 tablet (2 mg total) by mouth 4 (four) times daily as needed for diarrhea or loose stools (Can use up to 16 mg per day for diarrhea)., Disp: 30 tablet, Rfl: 2   metoCLOPramide  (REGLAN ) 10 MG tablet, Take 1 tablet (10 mg total) by mouth every 8 (eight) hours as needed for up to 3 days for nausea., Disp: 9 tablet, Rfl: 0   ondansetron  (ZOFRAN -ODT) 8 MG disintegrating tablet, Take 1 tablet (8 mg total) by mouth every 8 (eight) hours as needed for nausea or vomiting., Disp: 30 tablet, Rfl: 1   oxyCODONE -acetaminophen  (PERCOCET/ROXICET) 5-325 MG tablet, Take 1 tablet by mouth every 4 (four) hours as needed. Dx M54.2, Disp: 30 tablet, Rfl: 0   vitamin C (ASCORBIC ACID) 500 MG tablet, Take 500 mg by mouth 2 (two) times daily., Disp: , Rfl:  [2]  Allergies Allergen Reactions   Flovent  [Fluticasone  Propionate]     Difficulty breathing. No issues with Qvar  Rxed by Dr Corrie   Fluticasone     Fluticasone  Furoate    Mushroom Extract Complex (Obsolete) Swelling    Pt reports hand swelling when consuming mushrooms.    Other     Bee stings and pecan nuts

## 2024-11-10 ENCOUNTER — Ambulatory Visit: Attending: Orthopedic Surgery

## 2024-11-10 ENCOUNTER — Other Ambulatory Visit: Payer: Self-pay

## 2024-11-10 DIAGNOSIS — M6281 Muscle weakness (generalized): Secondary | ICD-10-CM | POA: Insufficient documentation

## 2024-11-10 DIAGNOSIS — M5412 Radiculopathy, cervical region: Secondary | ICD-10-CM | POA: Diagnosis present

## 2024-11-10 NOTE — Therapy (Signed)
 OUTPATIENT PHYSICAL THERAPY CERVICAL EVALUATION   Patient Name: Lynn Campos MRN: [id number] DOB:Jun 14, 1971, 53 y.o., female Today's Date: 11/11/2024  END OF SESSION:  PT End of Session - 11/10/24 1724     Visit Number 1    Date for Recertification  01/05/25    PT Start Time 1515    PT Stop Time 1600    PT Time Calculation (min) 45 min    Activity Tolerance Patient tolerated treatment well    Behavior During Therapy WFL for tasks assessed/performed          Past Medical History:  Diagnosis Date   Anemia    in the past   Anxiety    Arthritis    neck   Asthma    uses inhaler PRN   Dyspnea    GERD (gastroesophageal reflux disease)    Heart murmur    Hyperlipidemia    diet controlled   Hypertension    Lupus    Mitral regurgitation    Palpitations    Rosacea    Seasonal allergies    Shingles    Tuberculosis 2012   medication x 1 month for treatment   Past Surgical History:  Procedure Laterality Date   APPENDECTOMY     CERVICAL ABLATION  2022   with cerivcal cyst removed   CERVICAL SPINE SURGERY  01/2010   with plate placed   CHOLECYSTECTOMY  05/2010   ROTATOR CUFF REPAIR Left    SIGMOIDOSCOPY  2016   Dr. 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   Anxiety state 08/14/2010   ROSACEA 07/25/2009   MITRAL REGURGITATION 02/18/2009   GERD 02/18/2009   LUPUS 02/18/2009   PALPITATIONS 02/18/2009    PCP: Mahlon Crank, MD  REFERRING PROVIDER: Burnetta Aures, MD  REFERRING DIAG:  cervicalgia L UE radiculopathy   THERAPY DIAG:  Radiculopathy, cervical region - Plan: PT plan of care cert/re-cert  Muscle weakness (generalized) - Plan: PT plan of care cert/re-cert  Rationale for Evaluation and Treatment: Rehabilitation  ONSET DATE: greater than 6 months history of lower cervical spine pain and L and R UE radiculopathy  SUBJECTIVE:  SUBJECTIVE STATEMENT: Progressive lower cervical spine pain and L UE radiculopathy, pain, numbness, tingling extended to digits 4 and 5 on L, also deep ache R lat deltoid. Injection 2 weeks ago took edge off.  She does work out, cardiovascular 3 x week and free weights primarily biceps, and lat pull downs Hand dominance: Right  PERTINENT HISTORY:  Had injection about 2 weeks ago which helped with the radicular symptoms  PAIN:  Are you having pain? Yes: NPRS scale: 3 to 9 Pain location: lower cervical spine C 7/ T 1, L lateral arm , R lat upper arm Pain description: deep, sharp, tingling, burning Aggravating factors: sleep, standing, driving Relieving factors: some meds, the injection  PRECAUTIONS: None  RED FLAGS: Some falls due to LE coordination     WEIGHT BEARING RESTRICTIONS: No  FALLS:  Has patient fallen in last 6 months? Yes. Number of falls 2  LIVING ENVIRONMENT: Lives with: lives with their family Lives in: House/apartment Stairs: Yes: Internal: flight steps; on right going up Has following equipment at home: home supine cervical traction  OCCUPATION: first grade teacher  PLOF: Independent  PATIENT GOALS: avoid surgery  NEXT MD VISIT: Jan mid   OBJECTIVE:  Note: Objective measures were completed at Evaluation unless otherwise noted.  DIAGNOSTIC FINDINGS:  Per pt ant slippage of C 4 , stenosis C 7/8 , no  diagnostic findings available in EPIC today  PATIENT SURVEYS:  Neck Disability Index score: 22 / 50 = 44.0 %  COGNITION: Overall cognitive status: Within functional limits for tasks assessed  SENSATION: Intermittent numbness L UE along C 8 dermatome  POSTURE: forward head posture  PALPATION: Hypersensitive over C 6/7 spinous process, thickened B upper traps but not very tender   CERVICAL ROM:   Active ROM A/PROM (deg) eval  Flexion   Extension 10  Right lateral flexion   Left lateral flexion   Right rotation 55  Left rotation 75   (Blank rows = not tested)  UPPER EXTREMITY ROM: B shoulder AROM terminal 15 degrees restriction for elevation Reach behind back wnl Reach behind head wnl  UPPER EXTREMITY MMT:  MMT Right eval Left eval  Shoulder flexion    Shoulder extension    Shoulder abduction    Shoulder adduction    Shoulder extension    Shoulder internal rotation    Shoulder external rotation    Middle trapezius    Lower trapezius    Elbow flexion    Elbow extension  4  Wrist flexion  3+  Wrist extension    Wrist ulnar deviation    Wrist radial deviation  4-  Wrist pronation    Wrist supination    Grip strength     (Blank rows = wfl)  CERVICAL SPECIAL TESTS:  Distraction in supine increased L UE radicular sx  FUNCTIONAL TESTS:  na  TREATMENT DATE: 11/10/24  Evaluation, educated in rationale for HEP Instructed in stabilization ex for periscapular musculature and for deep neck flexors.  Had perform in supine to assist with better postural alignment   PATIENT EDUCATION:  Education details: POC, goals Person educated: Patient Education method: Explanation, Demonstration, Tactile cues, Verbal cues, and Handouts Education comprehension: verbalized understanding, returned demonstration, verbal cues required, tactile cues required,  and needs further education  HOME EXERCISE PROGRAM: Access Code: [email or web address] URL: [email or web address] Date: 11/10/2024 Prepared by: Greig Credit  Exercises - Standing Isometric Chin Tuck with Manual Resistance at Wall  - 1 x daily - 7 x weekly - 3 sets - 10 reps - 5 sec hold - Shoulder External Rotation and Scapular Retraction with Resistance  - 1 x daily - 7 x weekly - 3 sets - 10 reps - Chest Press with Resistance  - 1 x daily - 7 x weekly - 3 sets - 10 reps - Standing Shoulder Diagonal Horizontal Abduction 60/120 Degrees with Resistance  - 1 x daily - 7 x weekly - 3 sets - 10 reps  ASSESSMENT:  CLINICAL IMPRESSION: Patient is a 53 y.o. female who was evaluated today for physical therapy evaluation and treatment for cervical radiculopathy.  She presents with loss of cervical spine ROM B, as well as strength deficits L UE corresponding to C 7, C8, levels.  Hypersensitive over C 7 spinous process. She has + radicular signs with cervical distraction.  Initially instructed in deep cervical and thoracic stabilization ex today, did not attempt manual techniques or stretching due to high tissue sensitivity.   OBJECTIVE IMPAIRMENTS: decreased activity tolerance, decreased balance, decreased mobility, difficulty walking, decreased ROM, decreased strength, hypomobility, impaired perceived functional ability, increased muscle spasms, impaired flexibility, impaired sensation, impaired UE functional use, postural dysfunction, and pain.   ACTIVITY LIMITATIONS: carrying, lifting, sleeping, bed mobility, dressing, reach over head, and hygiene/grooming  PARTICIPATION LIMITATIONS: meal prep, cleaning, laundry, driving, community activity, occupation, and yard work  PERSONAL FACTORS: Behavior pattern and Education are also affecting patient's functional outcome.   REHAB POTENTIAL: Fair    CLINICAL DECISION MAKING: Evolving/moderate complexity  EVALUATION COMPLEXITY:  Moderate   GOALS: Goals reviewed with patient? Yes  SHORT TERM GOALS: Target date:  2 weeks, 11/24/24  I HEP Baseline:  Goal status: INITIAL   LONG TERM GOALS: Target date: 8 weeks, 01/06/24  Neck Disability Index score: 22 / 50 = 44.0 % Baseline:  Goal status: INITIAL  2.  Improve strength L UE for wrist extension and triceps to wnl Baseline: see chart Goal status: INITIAL  3.  Improve cervical spine rotation, B to 80 % without pain Baseline:  Goal status: INITIAL     PLAN:  PT FREQUENCY: 1x/week  PT DURATION: 8 weeks  PLANNED INTERVENTIONS: 97110-Therapeutic exercises, 97530- Therapeutic activity, 97112- Neuromuscular re-education, 97535- Self Care, [id number]- Manual therapy, and Patient/Family education  PLAN FOR NEXT SESSION: how has she tolerated the exercises , consider manual techniques to improve posture, advance deep neck stability and trunk stability to tolerance, possibly trial of nerve glides, kinesiotape   Jodie Cavey L Lashaun Krapf, PT,DPT, OCS 11/11/2024, 1:08 PM

## 2024-11-15 ENCOUNTER — Ambulatory Visit: Admitting: Family Medicine

## 2024-11-23 ENCOUNTER — Encounter: Payer: Self-pay | Admitting: Family Medicine

## 2024-11-23 MED ORDER — OXYCODONE-ACETAMINOPHEN 5-325 MG PO TABS: 1.0000 | ORAL_TABLET | ORAL | 0 refills | Status: DC | PRN

## 2024-11-23 NOTE — Telephone Encounter (Signed)
 Patient is asking for a refill on her oxycodone . Medication sent in on 11/03/24. Is she supposed to have a 30 day supply or 5 day?

## 2024-11-29 ENCOUNTER — Encounter: Payer: Self-pay | Admitting: Physical Therapy

## 2024-11-29 ENCOUNTER — Ambulatory Visit: Attending: Orthopedic Surgery | Admitting: Physical Therapy

## 2024-11-29 DIAGNOSIS — M5412 Radiculopathy, cervical region: Secondary | ICD-10-CM | POA: Diagnosis present

## 2024-11-29 DIAGNOSIS — M6281 Muscle weakness (generalized): Secondary | ICD-10-CM | POA: Diagnosis present

## 2024-11-29 NOTE — Therapy (Signed)
 " OUTPATIENT PHYSICAL THERAPY CERVICAL TREATMENT   Patient Name: Lynn Campos MRN: [id number] DOB:[birth year], 54 y.o., female Today's Date: 11/29/2024  END OF SESSION:  PT End of Session - 11/29/24 1516     Visit Number 2    Date for Recertification  01/05/25    PT Start Time 1515    PT Stop Time 1600    PT Time Calculation (min) 45 min    Activity Tolerance Patient tolerated treatment well    Behavior During Therapy [facility] for tasks assessed/performed          Past Medical History:  Diagnosis Date   Anemia    in the past   Anxiety    Arthritis    neck   Asthma    uses inhaler PRN   Dyspnea    GERD (gastroesophageal reflux disease)    Heart murmur    Hyperlipidemia    diet controlled   Hypertension    Lupus    Mitral regurgitation    Palpitations    Rosacea    Seasonal allergies    Shingles    Tuberculosis 2012   medication x 1 month for treatment   Past Surgical History:  Procedure Laterality Date   APPENDECTOMY     CERVICAL ABLATION  2022   with cerivcal cyst removed   CERVICAL SPINE SURGERY  01/2010   with plate placed   CHOLECYSTECTOMY  05/2010   ROTATOR CUFF REPAIR Left    SIGMOIDOSCOPY  2016   Dr. 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   Anxiety state 08/14/2010   ROSACEA 07/25/2009   MITRAL REGURGITATION 02/18/2009   GERD 02/18/2009   LUPUS 02/18/2009   PALPITATIONS 02/18/2009    PCP: Mahlon Crank, MD  REFERRING PROVIDER: Burnetta Aures, MD  REFERRING DIAG:  cervicalgia L UE radiculopathy   THERAPY DIAG:  Radiculopathy, cervical region  Muscle weakness (generalized)  Rationale for Evaluation and Treatment: Rehabilitation  ONSET DATE: greater than 6 months history of lower cervical spine pain and L and R UE radiculopathy  SUBJECTIVE:  SUBJECTIVE STATEMENT:  Not bad, still have her normal pain   Progressive lower cervical spine pain and L UE radiculopathy, pain, numbness, tingling extended to digits 4 and 5 on L, also deep ache R lat deltoid. Injection 2 weeks ago took edge off.  She does work out, cardiovascular 3 x week and free weights primarily biceps, and lat pull downs Hand dominance: Right  PERTINENT HISTORY:  Had injection about 2 weeks ago which helped with the radicular symptoms  PAIN:  Are you having pain? Yes: NPRS scale: 3/10 Pain location: lower cervical spine C 7/ T 1, L lateral arm , R lat upper arm Pain description: deep, sharp, tingling, burning Aggravating factors: sleep, standing, driving Relieving factors: some meds, the injection  PRECAUTIONS: None  RED FLAGS: Some falls due to LE coordination     WEIGHT BEARING RESTRICTIONS: No  FALLS:  Has patient fallen in last 6 months? Yes. Number of falls 2  LIVING ENVIRONMENT: Lives with: lives with their family Lives in: House/apartment Stairs: Yes: Internal: flight steps; on right going up Has following equipment at home: home supine cervical traction  OCCUPATION: first grade teacher  PLOF: Independent  PATIENT GOALS: avoid surgery  NEXT MD VISIT: Jan mid   OBJECTIVE:  Note: Objective measures were completed at Evaluation unless otherwise noted.  DIAGNOSTIC FINDINGS:  Per pt ant slippage of C 4 , stenosis C 7/8 , no diagnostic findings available in EPIC  today  PATIENT SURVEYS:  Neck Disability Index score: 22 / 50 = 44.0 %  COGNITION: Overall cognitive status: Within functional limits for tasks assessed  SENSATION: Intermittent numbness L UE along C 8 dermatome  POSTURE: forward head posture  PALPATION: Hypersensitive over C 6/7 spinous process, thickened B upper traps but not very tender   CERVICAL ROM:   Active ROM A/PROM (deg) eval  Flexion   Extension 10  Right lateral flexion   Left lateral flexion   Right rotation 55  Left rotation 75   (Blank rows = not tested)  UPPER EXTREMITY ROM: B shoulder AROM terminal 15 degrees restriction for elevation Reach behind back wnl Reach behind head wnl  UPPER EXTREMITY MMT:  MMT Right eval Left eval  Shoulder flexion    Shoulder extension    Shoulder abduction    Shoulder adduction    Shoulder extension    Shoulder internal rotation    Shoulder external rotation    Middle trapezius    Lower trapezius    Elbow flexion    Elbow extension  4  Wrist flexion  3+  Wrist extension    Wrist ulnar deviation    Wrist radial deviation  4-  Wrist pronation    Wrist supination    Grip strength     (Blank rows = wfl)  CERVICAL SPECIAL TESTS:  Distraction in supine increased L UE radicular sx  FUNCTIONAL TESTS:  na  TREATMENT DATE:  11/30/23 NuStep L 5 x 6 min Rows & Ext blue 2x10 Shoulder ER yellow 2x10 Cervical retraction yellow band x10 Shoulder abd 2lb 2x10 Shoulder Flex 3lb 2x10 STM to UT and cervical spine   11/10/24  Evaluation, educated in rationale for HEP Instructed in stabilization ex for periscapular musculature and for deep neck flexors.  Had perform in supine to assist with better postural alignment   PATIENT EDUCATION:  Education details: POC, goals Person educated: Patient Education method: Explanation, Demonstration,  Tactile cues, Verbal cues, and Handouts Education comprehension: verbalized understanding, returned demonstration, verbal cues required, tactile cues required, and needs further education  HOME EXERCISE PROGRAM: Access Code: [email or web address] URL: [email or web address] Date: 11/10/2024 Prepared by: Greig Credit  Exercises - Standing Isometric Chin Tuck with Manual Resistance at Wall  - 1 x daily - 7 x weekly - 3 sets - 10 reps - 5 sec hold - Shoulder External Rotation and Scapular Retraction with Resistance  - 1 x daily - 7 x weekly - 3 sets - 10 reps - Chest Press with Resistance  - 1 x daily - 7 x weekly - 3 sets - 10 reps - Standing Shoulder Diagonal Horizontal Abduction 60/120 Degrees with Resistance  - 1 x daily - 7 x weekly - 3 sets - 10 reps  ASSESSMENT:  CLINICAL IMPRESSION: Patient is a 54 y.o. female who was seen today for physical therapy treatment for cervical radiculopathy.  She presents with loss of cervical spine ROM B, as well as strength deficits L UE corresponding to C 7, C8, levels. Progressed with postural strengthening while educating on posture. Tactiel cue to squeeze scapulas together with ER and rows. Nec pain reported with cervical retractions. Cervical para spinales  tightness noted with STM, L arm pain with R cervical side bending. Hypersensitivity in the cervical spine prevented any deep tissue work.    OBJECTIVE IMPAIRMENTS: decreased activity tolerance, decreased balance, decreased mobility, difficulty walking, decreased ROM, decreased strength, hypomobility, impaired perceived functional ability, increased muscle spasms, impaired flexibility, impaired sensation, impaired UE functional use, postural dysfunction, and pain.   ACTIVITY LIMITATIONS: carrying, lifting, sleeping, bed mobility, dressing, reach over head, and hygiene/grooming  PARTICIPATION LIMITATIONS: meal prep, cleaning, laundry, driving, community activity, occupation, and yard work  PERSONAL  FACTORS: Behavior pattern and Education are also affecting patient's functional outcome.   REHAB POTENTIAL: Fair    CLINICAL DECISION MAKING: Evolving/moderate complexity  EVALUATION COMPLEXITY: Moderate   GOALS: Goals reviewed with patient? Yes  SHORT TERM GOALS: Target date:  2 weeks, 11/24/24  I HEP Baseline:  Goal status: Met 11/29/24   LONG TERM GOALS: Target date: 8 weeks, 01/06/24  Neck Disability Index score: 22 / 50 = 44.0 % Baseline:  Goal status: INITIAL  2.  Improve strength L UE for wrist extension and triceps to wnl Baseline: see chart Goal status: INITIAL  3.  Improve cervical spine rotation, B to 80 % without pain Baseline:  Goal status: INITIAL     PLAN:  PT FREQUENCY: 1x/week  PT DURATION: 8 weeks  PLANNED INTERVENTIONS: 97110-Therapeutic exercises, 97530- Therapeutic activity, 97112- Neuromuscular re-education, 97535- Self Care, [id number]- Manual therapy, and Patient/Family education  PLAN FOR NEXT SESSION: how has she tolerated the exercises , consider manual techniques to improve posture, advance deep neck stability and trunk stability to tolerance, possibly trial of nerve glides, kinesiotape   Tanda KANDICE Sorrow, PTA, 11/29/2024, 3:16 PM      "

## 2024-12-01 ENCOUNTER — Other Ambulatory Visit: Payer: Self-pay | Admitting: Family Medicine

## 2024-12-07 ENCOUNTER — Ambulatory Visit: Admitting: Physical Therapy

## 2024-12-14 ENCOUNTER — Ambulatory Visit: Admitting: Physical Therapy

## 2024-12-17 ENCOUNTER — Other Ambulatory Visit: Payer: Self-pay | Admitting: Family Medicine

## 2024-12-21 ENCOUNTER — Ambulatory Visit: Admitting: Physical Therapy

## 2024-12-22 ENCOUNTER — Encounter: Payer: Self-pay | Admitting: Family Medicine

## 2024-12-22 MED ORDER — OXYCODONE-ACETAMINOPHEN 5-325 MG PO TABS: 1.0000 | ORAL_TABLET | ORAL | 0 refills | Status: AC | PRN

## 2024-12-22 NOTE — Telephone Encounter (Signed)
 Requested Prescriptions   Pending Prescriptions Disp Refills   oxyCODONE -acetaminophen  (PERCOCET/ROXICET) 5-325 MG tablet 30 tablet 0    Sig: Take 1 tablet by mouth every 4 (four) hours as needed. Dx M54.2     Date of patient request: 12/22/24 Last office visit: 08/25/2024 Upcoming visit: Visit date not found Date of last refill: 11/23/24 Last refill amount: 30
